# Patient Record
Sex: Female | Born: 1985 | Race: Black or African American | Hispanic: No | Marital: Single | Smoking: Never smoker
Health system: Southern US, Community
[De-identification: ages and names within clinical notes are randomized; demographics above are authoritative.]

## PROBLEM LIST (undated history)

## (undated) DIAGNOSIS — R569 Unspecified convulsions: Secondary | ICD-10-CM

## (undated) DIAGNOSIS — N289 Disorder of kidney and ureter, unspecified: Secondary | ICD-10-CM

## (undated) DIAGNOSIS — D649 Anemia, unspecified: Secondary | ICD-10-CM

## (undated) DIAGNOSIS — I2699 Other pulmonary embolism without acute cor pulmonale: Secondary | ICD-10-CM

## (undated) DIAGNOSIS — E739 Lactose intolerance, unspecified: Secondary | ICD-10-CM

## (undated) HISTORY — DX: Anemia, unspecified: D64.9

## (undated) HISTORY — DX: Unspecified convulsions: R56.9

## (undated) HISTORY — DX: Lactose intolerance, unspecified: E73.9

## (undated) HISTORY — PX: NO PAST SURGERIES: SHX2092

## (undated) HISTORY — DX: Disorder of kidney and ureter, unspecified: N28.9

---

## 2016-01-14 DIAGNOSIS — O88219 Thromboembolism in pregnancy, unspecified trimester: Secondary | ICD-10-CM | POA: Insufficient documentation

## 2016-02-17 DIAGNOSIS — Z86711 Personal history of pulmonary embolism: Secondary | ICD-10-CM | POA: Insufficient documentation

## 2017-09-10 ENCOUNTER — Emergency Department (HOSPITAL_COMMUNITY)
Admission: EM | Admit: 2017-09-10 | Discharge: 2017-09-10 | Discharge status: Against Medical Advice | Payer: Medicaid Other

## 2017-09-13 ENCOUNTER — Emergency Department (HOSPITAL_COMMUNITY): Payer: Medicaid Other

## 2017-09-13 ENCOUNTER — Encounter (HOSPITAL_COMMUNITY): Payer: Self-pay | Admitting: *Deleted

## 2017-09-13 ENCOUNTER — Emergency Department (HOSPITAL_COMMUNITY)
Admission: EM | Admit: 2017-09-13 | Discharge: 2017-09-13 | Disposition: A | Discharge status: Home / Self Care | Payer: Medicaid Other | Attending: Emergency Medicine | Admitting: Emergency Medicine

## 2017-09-13 DIAGNOSIS — M25561 Pain in right knee: Secondary | ICD-10-CM | POA: Insufficient documentation

## 2017-09-13 DIAGNOSIS — R197 Diarrhea, unspecified: Secondary | ICD-10-CM | POA: Diagnosis present

## 2017-09-13 DIAGNOSIS — M7918 Myalgia, other site: Secondary | ICD-10-CM | POA: Diagnosis not present

## 2017-09-13 DIAGNOSIS — R112 Nausea with vomiting, unspecified: Secondary | ICD-10-CM | POA: Insufficient documentation

## 2017-09-13 DIAGNOSIS — M791 Myalgia, unspecified site: Secondary | ICD-10-CM

## 2017-09-13 HISTORY — DX: Other pulmonary embolism without acute cor pulmonale: I26.99

## 2017-09-13 LAB — URINALYSIS, ROUTINE W REFLEX MICROSCOPIC
Bilirubin Urine: NEGATIVE
Glucose, UA: NEGATIVE mg/dL
Hgb urine dipstick: NEGATIVE
Ketones, ur: NEGATIVE mg/dL
NITRITE: NEGATIVE
PH: 6 (ref 5.0–8.0)
PROTEIN: NEGATIVE mg/dL
Specific Gravity, Urine: 1.011 (ref 1.005–1.030)

## 2017-09-13 LAB — COMPREHENSIVE METABOLIC PANEL
ALT: 15 U/L (ref 14–54)
ANION GAP: 9 (ref 5–15)
AST: 17 U/L (ref 15–41)
Albumin: 4.1 g/dL (ref 3.5–5.0)
Alkaline Phosphatase: 51 U/L (ref 38–126)
BUN: 6 mg/dL (ref 6–20)
CHLORIDE: 108 mmol/L (ref 101–111)
CO2: 21 mmol/L — ABNORMAL LOW (ref 22–32)
Calcium: 9 mg/dL (ref 8.9–10.3)
Creatinine, Ser: 0.75 mg/dL (ref 0.44–1.00)
Glucose, Bld: 91 mg/dL (ref 65–99)
POTASSIUM: 3.9 mmol/L (ref 3.5–5.1)
Sodium: 138 mmol/L (ref 135–145)
TOTAL PROTEIN: 8 g/dL (ref 6.5–8.1)
Total Bilirubin: 0.5 mg/dL (ref 0.3–1.2)

## 2017-09-13 LAB — LIPASE, BLOOD: LIPASE: 28 U/L (ref 11–51)

## 2017-09-13 LAB — CBC
HEMATOCRIT: 39.5 % (ref 36.0–46.0)
Hemoglobin: 12.9 g/dL (ref 12.0–15.0)
MCH: 27.9 pg (ref 26.0–34.0)
MCHC: 32.7 g/dL (ref 30.0–36.0)
MCV: 85.3 fL (ref 78.0–100.0)
Platelets: 214 10*3/uL (ref 150–400)
RBC: 4.63 MIL/uL (ref 3.87–5.11)
RDW: 15 % (ref 11.5–15.5)
WBC: 5.8 10*3/uL (ref 4.0–10.5)

## 2017-09-13 LAB — I-STAT BETA HCG BLOOD, ED (MC, WL, AP ONLY): I-stat hCG, quantitative: <5 m[IU]/mL (ref <5)

## 2017-09-13 MED ORDER — ONDANSETRON 4 MG PO TBDP: 4.0000 mg | ORAL_TABLET | Freq: Three times a day (TID) | ORAL | 0 refills | Status: AC | PRN

## 2017-09-13 NOTE — ED Notes (Signed)
Patient provided with water.

## 2017-09-13 NOTE — ED Provider Notes (Addendum)
[facility]-EMERGENCY DEPT Provider Note  CSN: [id number] Arrival date & time: 09/13/17 1040  Chief Complaint(s) Diarrhea; Generalized Body Aches; and Headache  HPI Gabriella Montgomery is a 32 y.o. female   The history is provided by the patient.  GI Problem  This is a new problem. Episode onset: 4 days. The problem occurs daily. Progression since onset: fluctuating. Associated symptoms include headaches. Pertinent negatives include no chest pain, no abdominal pain and no shortness of breath. Nothing aggravates the symptoms. Nothing relieves the symptoms. Treatments tried: pepto bismal. The treatment provided mild relief.  Headache   This is a recurrent problem. Episode onset: several days. Episode frequency: intermittent. Progression since onset: fluctuating. Associated with: N/V/D. The pain is located in the frontal, temporal and bilateral region. The quality of the pain is described as throbbing. The pain is moderate. The pain does not radiate. Associated symptoms include nausea and vomiting. Pertinent negatives include no fever and no shortness of breath. She has tried acetaminophen for the symptoms. The treatment provided mild relief.   Reports sick contacts at home with GI sx.  Patient also reports having a syncopal episode while at work.  She states that she felt lightheaded and was trying to get to a chair when she fell causing her to land on her right knee.  She reports that she works at [employer] where it is hot.  Denies any head trauma.  Right knee pain is constant and exacerbated with ambulation and palpation.  No alleviating factors.  Denies any other injuries.  Went to [facility] on Friday, but LWBS.  Past Medical History Past Medical History:  Diagnosis Date  . Pulmonary embolism (HCC)    There are no active problems to display for this patient.  Home Medication(s) Prior to Admission medications   Medication Sig Start Date End Date Taking? Authorizing  Provider  ondansetron (ZOFRAN ODT) 4 MG disintegrating tablet Take 1 tablet (4 mg total) by mouth every 8 (eight) hours as needed for up to 3 days for nausea or vomiting. 09/13/17 09/16/17  Cardama, Amadeo Garnet, MD                                                                                                                                    Past Surgical History History reviewed. No pertinent surgical history. Family History No family history on file.  Social History Social History   Tobacco Use  . Smoking status: Never Smoker  . Smokeless tobacco: Never Used  Substance Use Topics  . Alcohol use: Not Currently  . Drug use: Not on file   Allergies Amoxicillin; Ibuprofen; and Tramadol  Review of Systems Review of Systems  Constitutional: Negative for fever.  Respiratory: Negative for shortness of breath.   Cardiovascular: Negative for chest pain.  Gastrointestinal: Positive for nausea and vomiting. Negative for abdominal pain.  Neurological: Positive for headaches.   All other systems are reviewed  and are negative for acute change except as noted in the HPI  Physical Exam Vital Signs  I have reviewed the triage vital signs BP 139/86 (BP Location: Left Arm)   Pulse 87   Temp 98.5 F (36.9 C) (Oral)   Resp 18   LMP 08/14/2017   SpO2 100%   Physical Exam  Constitutional: She is oriented to person, place, and time. She appears well-developed and well-nourished. No distress.  HENT:  Head: Normocephalic and atraumatic.  Nose: Nose normal.  Eyes: Pupils are equal, round, and reactive to light. Conjunctivae and EOM are normal. Right eye exhibits no discharge. Left eye exhibits no discharge. No scleral icterus.  Neck: Normal range of motion. Neck supple.  Cardiovascular: Normal rate and regular rhythm. Exam reveals no gallop and no friction rub.  No murmur heard. Pulmonary/Chest: Effort normal and breath sounds normal. No stridor. No respiratory distress. She has no rales.    Abdominal: Soft. She exhibits no distension. There is no tenderness. There is no rigidity and no guarding.  Musculoskeletal: She exhibits no edema or tenderness.  Neurological: She is alert and oriented to person, place, and time.  Skin: Skin is warm and dry. No rash noted. She is not diaphoretic. No erythema.  Psychiatric: She has a normal mood and affect.  Vitals reviewed.   ED Results and Treatments Labs (all labs ordered are listed, but only abnormal results are displayed) Labs Reviewed  COMPREHENSIVE METABOLIC PANEL - Abnormal; Notable for the following components:      Result Value   CO2 21 (*)    All other components within normal limits  URINALYSIS, ROUTINE W REFLEX MICROSCOPIC - Abnormal; Notable for the following components:   Leukocytes, UA SMALL (*)    Bacteria, UA RARE (*)    All other components within normal limits  LIPASE, BLOOD  CBC  I-STAT BETA HCG BLOOD, ED ([facility], [facility], [facility] ONLY)                                                                                                                         EKG  EKG Interpretation  Date/Time:    Ventricular Rate:    PR Interval:    QRS Duration:   QT Interval:    QTC Calculation:   R Axis:     Text Interpretation:        Radiology Dg Knee Complete 4 Views Right  Result Date: 09/13/2017 CLINICAL DATA:  Knee pain after falling EXAM: RIGHT KNEE - COMPLETE 4+ VIEW COMPARISON:  None. FINDINGS: Well corticated fragments at the tibial tubercle may be a sequela of Osgood Schlatter disease. There is a small suprapatellar effusion. No acute fracture or dislocation of the right knee. IMPRESSION: 1. No acute fracture or dislocation of the right knee. 2. Corticated fragments at the tibial tubercle may be a sequela of Osgood-Schlatter disease. Electronically Signed   By: Deatra Robinson M.D.   On: 09/13/2017 17:54   Pertinent labs & imaging results  that were available during my care of the patient were reviewed by me and considered  in my medical decision making (see chart for details).  Medications Ordered in ED Medications - No data to display                                                                                                                                  Procedures Procedures  (including critical care time)  Medical Decision Making / ED Course I have reviewed the nursing notes for this encounter and the patient's prior records (if available in EHR or on provided paperwork).    32 y.o. female presents with vomiting, diarrhea, myalgia for 4 days. No possible suspicious food intake. decreased oral tolerance. Rest of history as above.  Patient appears well, not in distress, and with no signs of toxicity or dehydration. Abdomen benign.  Rest of the exam as above  Most consistent with viral gastroenteritis.   Doubt appendicitis, diverticulitis, severe colitis, dysentery.    Able to tolerate oral intake in the ED.  Plain film of the right knee without acute fracture dislocation.  Discussed symptomatic treatment with the patient and they will follow closely with their PCP.   Final Clinical Impression(s) / ED Diagnoses Final diagnoses:  Nausea vomiting and diarrhea  Myalgia  Right knee pain   Disposition: Discharge  Condition: Good  I have discussed the results, Dx and Tx plan with the patient who expressed understanding and agree(s) with the plan. Discharge instructions discussed at great length. The patient was given strict return precautions who verbalized understanding of the instructions. No further questions at time of discharge.    ED Discharge Orders        Ordered    ondansetron (ZOFRAN ODT) 4 MG disintegrating tablet  Every 8 hours PRN     09/13/17 1633       Follow Up: Primary care provider   If you do not have a primary care physician, contact [facility] at [phone] for referral      This chart was dictated using voice recognition software.  Despite best  efforts to proofread,  errors can occur which can change the documentation meaning.     Nira Conn, MD 09/13/17 [phone]

## 2017-09-13 NOTE — ED Notes (Signed)
Patient transported to X-ray 

## 2017-09-13 NOTE — ED Triage Notes (Signed)
Pt complains of diarrhea, body aches, dizziness for the past couple of days. Pt states she felt lightheaded and passed out a couple of days ago, scraping her right ankle.

## 2017-09-13 NOTE — ED Notes (Signed)
ED Provider at bedside. 

## 2017-09-13 NOTE — ED Notes (Signed)
No answer when called for recheck on vital signs 

## 2018-04-03 ENCOUNTER — Emergency Department (HOSPITAL_COMMUNITY): Payer: Medicaid Other

## 2018-04-03 ENCOUNTER — Emergency Department (HOSPITAL_COMMUNITY)
Admission: EM | Admit: 2018-04-03 | Discharge: 2018-04-03 | Disposition: A | Discharge status: Home / Self Care | Payer: Medicaid Other | Attending: Emergency Medicine | Admitting: Emergency Medicine

## 2018-04-03 ENCOUNTER — Encounter (HOSPITAL_COMMUNITY): Payer: Self-pay | Admitting: Emergency Medicine

## 2018-04-03 DIAGNOSIS — Y92512 Supermarket, store or market as the place of occurrence of the external cause: Secondary | ICD-10-CM | POA: Insufficient documentation

## 2018-04-03 DIAGNOSIS — Y9301 Activity, walking, marching and hiking: Secondary | ICD-10-CM | POA: Insufficient documentation

## 2018-04-03 DIAGNOSIS — Z86711 Personal history of pulmonary embolism: Secondary | ICD-10-CM | POA: Diagnosis not present

## 2018-04-03 DIAGNOSIS — R030 Elevated blood-pressure reading, without diagnosis of hypertension: Secondary | ICD-10-CM | POA: Diagnosis not present

## 2018-04-03 DIAGNOSIS — M25561 Pain in right knee: Secondary | ICD-10-CM

## 2018-04-03 DIAGNOSIS — S8991XA Unspecified injury of right lower leg, initial encounter: Secondary | ICD-10-CM | POA: Insufficient documentation

## 2018-04-03 DIAGNOSIS — Y998 Other external cause status: Secondary | ICD-10-CM | POA: Insufficient documentation

## 2018-04-03 DIAGNOSIS — S39012A Strain of muscle, fascia and tendon of lower back, initial encounter: Secondary | ICD-10-CM | POA: Insufficient documentation

## 2018-04-03 LAB — POC URINE PREG, ED: PREG TEST UR: NEGATIVE

## 2018-04-03 MED ORDER — ACETAMINOPHEN 325 MG PO TABS: 650.0000 mg | ORAL_TABLET | Freq: Four times a day (QID) | ORAL | 0 refills | Status: DC | PRN

## 2018-04-03 MED ORDER — METHOCARBAMOL 500 MG PO TABS: 500.0000 mg | ORAL_TABLET | Freq: Two times a day (BID) | ORAL | 0 refills | Status: DC
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
## 2018-04-03 NOTE — Discharge Instructions (Addendum)
It was my pleasure taking care of you today!   Please see the information and instructions below regarding your visit.  Your diagnoses today include:  1. Acute pain of right knee   2. Injury of right knee, initial encounter   3. Strain of lumbar region, initial encounter   4. Elevated blood pressure reading without diagnosis of hypertension     Tests performed today include: See side panel of your discharge paperwork for testing performed today. Vital signs are listed at the bottom of these instructions.   Medications prescribed:    Take any prescribed medications only as prescribed, and any over the counter medications only as directed on the packaging.  Naproxen and Robaxin as needed for pain. Use crutches as needed for comfort. Ice and elevate knee throughout the day.  You are prescribed Robaxin, a muscle relaxant. Some common side effects of this medication include:  Feeling sleepy.  Dizziness. Take care upon going from a seated to a standing position.  Dry mouth.  Feeling tired or weak.  Hard stools (constipation).  Upset stomach. These are not all of the side effects that may occur. If you have questions about side effects, call your doctor. Call your primary care provider for medical advice about side effects.  This medication can be sedating. Only take this medication as needed. Please do not combine with alcohol. Do not drive or operate machinery while taking this medication.   This medication can interact with some other medications. Make sure to tell any provider you are taking this medication before they prescribe you a new medication.   You are prescribed Tylenol.  You may take 650 mg every 6 hours as needed for pain.  Do not exceed 4 g in 1 day.  Home care instructions:  Please follow any educational materials contained in this packet.   Please apply the knee sleeve.  Please weight bear as tolerated on the right knee.   Follow-up instructions: Please follow-up  with your primary care provider in one week for further evaluation of your symptoms if they are not completely improved.   Call the orthopedist listed today or tomorrow to schedule follow up appointment for recheck of ongoing knee pain in one to two weeks. That appointment can be canceled with a 24-48 hour notice if complete resolution of pain.   Follow up with the orthopedist listed if symptoms do not improve in one week.   Return instructions:  Please return to the Emergency Department if you experience worsening symptoms.  Please return for any increasing swelling, increasing pain, loss of color to the lower leg, or increasing redness. Please return if you have any other emergent concerns.  Additional Information:   Your vital signs today were: BP 139/90 (BP Location: Left Arm)    Pulse 66    Temp 98.9 F (37.2 C) (Oral)    Resp 18    LMP 03/27/2018    SpO2 100%  If your blood pressure (BP) was elevated on multiple readings during this visit above 130 for the top number or above 80 for the bottom number, please have this repeated by your primary care provider within one month. --------------
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
## 2018-06-30 ENCOUNTER — Encounter (HOSPITAL_COMMUNITY): Payer: Self-pay | Admitting: Emergency Medicine

## 2018-06-30 ENCOUNTER — Emergency Department (HOSPITAL_COMMUNITY)
Admission: EM | Admit: 2018-06-30 | Discharge: 2018-06-30 | Disposition: A | Discharge status: Home / Self Care | Payer: Medicaid Other | Attending: Emergency Medicine | Admitting: Emergency Medicine

## 2018-06-30 ENCOUNTER — Other Ambulatory Visit: Payer: Self-pay

## 2018-06-30 DIAGNOSIS — Z79899 Other long term (current) drug therapy: Secondary | ICD-10-CM | POA: Insufficient documentation

## 2018-06-30 DIAGNOSIS — B9789 Other viral agents as the cause of diseases classified elsewhere: Secondary | ICD-10-CM | POA: Diagnosis not present

## 2018-06-30 DIAGNOSIS — J029 Acute pharyngitis, unspecified: Secondary | ICD-10-CM | POA: Diagnosis present

## 2018-06-30 DIAGNOSIS — J069 Acute upper respiratory infection, unspecified: Secondary | ICD-10-CM | POA: Insufficient documentation

## 2018-06-30 MED ORDER — PREDNISONE 20 MG PO TABS
60.0000 mg | ORAL_TABLET | Freq: Once | ORAL | Status: AC
  Administered 2018-06-30: 60 mg via ORAL
  Filled 2018-06-30: qty 3

## 2018-06-30 MED ORDER — ALBUTEROL SULFATE HFA 108 (90 BASE) MCG/ACT IN AERS
2.0000 | INHALATION_SPRAY | RESPIRATORY_TRACT | Status: DC | PRN
  Administered 2018-06-30: 2 via RESPIRATORY_TRACT
  Filled 2018-06-30: qty 6.7

## 2018-06-30 MED ORDER — PREDNISONE 20 MG PO TABS: 40.0000 mg | ORAL_TABLET | Freq: Every day | ORAL | 0 refills | Status: DC

## 2018-06-30 MED ORDER — BENZONATATE 100 MG PO CAPS: 100.0000 mg | ORAL_CAPSULE | Freq: Three times a day (TID) | ORAL | 0 refills | Status: DC | PRN

## 2018-06-30 NOTE — ED Triage Notes (Addendum)
Patient complaining of sore throat that started Sunday night. She also complaining of catching herself wheezing at night. She also states when she cough it makes her head hurt.

## 2018-06-30 NOTE — ED Notes (Signed)
Bed: WA05 Expected date:  Expected time:  Means of arrival:  Comments: 

## 2018-06-30 NOTE — ED Provider Notes (Signed)
[facility]-EMER[facility] DEPT Provider Note   CSN: [id number] Arrival date & time: 06/30/18  5883    History   Chief Complaint Chief Complaint  Patient presents with  . Sore Throat  . Wheezing    HPI Gabriella Montgomery is a 33 y.o. female.     Patient presents to the emergency department for evaluation of sore throat and cough.  Symptoms have been present for 4 or 5 days.  She reports that sometimes, mostly at night, she feels like she is wheezing.  No chest pain or shortness of breath.  She has not had a fever.     Past Medical History:  Diagnosis Date  . Pulmonary embolism (HCC)     There are no active problems to display for this patient.   History reviewed. No pertinent surgical history.   OB History   No obstetric history on file.      Home Medications    Prior to Admission medications   Medication Sig Start Date End Date Taking? Authorizing Provider  acetaminophen (TYLENOL) 325 MG tablet Take 2 tablets (650 mg total) by mouth every 6 (six) hours as needed. 04/03/18   Aviva Kluver B, PA-C  benzonatate (TESSALON) 100 MG capsule Take 1 capsule (100 mg total) by mouth 3 (three) times daily as needed for cough. 06/30/18   Gilda Crease, MD  methocarbamol (ROBAXIN) 500 MG tablet Take 1 tablet (500 mg total) by mouth 2 (two) times daily. 04/03/18   Aviva Kluver B, PA-C  predniSONE (DELTASONE) 20 MG tablet Take 2 tablets (40 mg total) by mouth daily with breakfast. 06/30/18   Aly Seidenberg, Canary Brim, MD    Family History History reviewed. No pertinent family history.  Social History Social History   Tobacco Use  . Smoking status: Never Smoker  . Smokeless tobacco: Never Used  Substance Use Topics  . Alcohol use: Not Currently  . Drug use: Never     Allergies   Azithromycin; Amoxicillin; Clindamycin; Ibuprofen; and Tramadol   Review of Systems Review of Systems  Constitutional: Negative for fever.  HENT: Positive for sore  throat.   Respiratory: Positive for cough.   All other systems reviewed and are negative.    Physical Exam Updated Vital Signs BP 129/90 (BP Location: Left Arm)   Pulse 86   Temp 98.5 F (36.9 C) (Oral)   Resp 20   Ht 5\' 5"  (1.651 m)   Wt 99.8 kg   LMP 06/04/2018   SpO2 100%   BMI 36.61 kg/m   Physical Exam Vitals signs and nursing note reviewed.  Constitutional:      General: She is not in acute distress.    Appearance: Normal appearance. She is well-developed.  HENT:     Head: Normocephalic and atraumatic.     Right Ear: Hearing normal.     Left Ear: Hearing normal.     Nose: Nose normal.  Eyes:     Conjunctiva/sclera: Conjunctivae normal.     Pupils: Pupils are equal, round, and reactive to light.  Neck:     Musculoskeletal: Normal range of motion and neck supple.  Cardiovascular:     Rate and Rhythm: Regular rhythm.     Heart sounds: S1 normal and S2 normal. No murmur. No friction rub. No gallop.   Pulmonary:     Effort: Pulmonary effort is normal. No respiratory distress.     Breath sounds: Normal breath sounds.  Chest:     Chest wall: No tenderness.  Abdominal:     General: Bowel sounds are normal.     Palpations: Abdomen is soft.     Tenderness: There is no abdominal tenderness. There is no guarding or rebound. Negative signs include Murphy's sign and McBurney's sign.     Hernia: No hernia is present.  Musculoskeletal: Normal range of motion.  Skin:    General: Skin is warm and dry.     Findings: No rash.  Neurological:     Mental Status: She is alert and oriented to person, place, and time.     GCS: GCS eye subscore is 4. GCS verbal subscore is 5. GCS motor subscore is 6.     Cranial Nerves: No cranial nerve deficit.     Sensory: No sensory deficit.     Coordination: Coordination normal.  Psychiatric:        Speech: Speech normal.        Behavior: Behavior normal.        Thought Content: Thought content normal.      ED Treatments / Results    Labs (all labs ordered are listed, but only abnormal results are displayed) Labs Reviewed - No data to display  EKG None  Radiology No results found.  Procedures Procedures (including critical care time)  Medications Ordered in ED Medications  albuterol (PROVENTIL HFA;VENTOLIN HFA) 108 (90 Base) MCG/ACT inhaler 2 puff (has no administration in time range)  predniSONE (DELTASONE) tablet 60 mg (has no administration in time range)     Initial Impression / Assessment and Plan / ED Course  I have reviewed the triage vital signs and the nursing notes.  Pertinent labs & imaging results that were available during my care of the patient were reviewed by me and considered in my medical decision making (see chart for details).        Patient presents with complaints of sore throat and cough.  Her lungs are clear on auscultation.  No wheezing, no clinical concern for pneumonia.  Oxygen saturation 100% on room air.  All vitals are normal, afebrile.  Oral pharyngeal examination is clear, no erythema, exudate or signs of abscess.  Patient reassured, treat for viral upper respiratory infection with possible intermittent bronchospasm.  Final Clinical Impressions(s) / ED Diagnoses   Final diagnoses:  Viral URI with cough    ED Discharge Orders         Ordered    benzonatate (TESSALON) 100 MG capsule  3 times daily PRN     06/30/18 0546    predniSONE (DELTASONE) 20 MG tablet  Daily with breakfast     06/30/18 0546           Gilda Crease, MD 06/30/18 [phone]

## 2019-07-13 ENCOUNTER — Emergency Department (HOSPITAL_COMMUNITY): Payer: Medicaid Other

## 2019-07-13 ENCOUNTER — Other Ambulatory Visit: Payer: Self-pay

## 2019-07-13 ENCOUNTER — Inpatient Hospital Stay (HOSPITAL_COMMUNITY)
Admission: EM | Admit: 2019-07-13 | Discharge: 2019-07-19 | DRG: 177 | Disposition: A | Discharge status: Home / Self Care | Payer: Medicaid Other | Attending: Internal Medicine | Admitting: Internal Medicine

## 2019-07-13 DIAGNOSIS — E876 Hypokalemia: Secondary | ICD-10-CM | POA: Diagnosis not present

## 2019-07-13 DIAGNOSIS — U071 COVID-19: Secondary | ICD-10-CM | POA: Diagnosis not present

## 2019-07-13 DIAGNOSIS — J1282 Pneumonia due to coronavirus disease 2019: Secondary | ICD-10-CM

## 2019-07-13 DIAGNOSIS — R509 Fever, unspecified: Secondary | ICD-10-CM | POA: Diagnosis not present

## 2019-07-13 DIAGNOSIS — J9601 Acute respiratory failure with hypoxia: Secondary | ICD-10-CM | POA: Diagnosis present

## 2019-07-13 DIAGNOSIS — E860 Dehydration: Secondary | ICD-10-CM | POA: Diagnosis present

## 2019-07-13 DIAGNOSIS — Z23 Encounter for immunization: Secondary | ICD-10-CM

## 2019-07-13 DIAGNOSIS — S0990XA Unspecified injury of head, initial encounter: Secondary | ICD-10-CM | POA: Diagnosis not present

## 2019-07-13 DIAGNOSIS — R4182 Altered mental status, unspecified: Secondary | ICD-10-CM | POA: Diagnosis not present

## 2019-07-13 DIAGNOSIS — N170 Acute kidney failure with tubular necrosis: Secondary | ICD-10-CM | POA: Diagnosis present

## 2019-07-13 DIAGNOSIS — Z885 Allergy status to narcotic agent status: Secondary | ICD-10-CM

## 2019-07-13 DIAGNOSIS — J189 Pneumonia, unspecified organism: Secondary | ICD-10-CM | POA: Diagnosis present

## 2019-07-13 DIAGNOSIS — S199XXA Unspecified injury of neck, initial encounter: Secondary | ICD-10-CM | POA: Diagnosis not present

## 2019-07-13 DIAGNOSIS — G40909 Epilepsy, unspecified, not intractable, without status epilepticus: Secondary | ICD-10-CM | POA: Diagnosis present

## 2019-07-13 DIAGNOSIS — R404 Transient alteration of awareness: Secondary | ICD-10-CM | POA: Diagnosis not present

## 2019-07-13 DIAGNOSIS — Z886 Allergy status to analgesic agent status: Secondary | ICD-10-CM

## 2019-07-13 DIAGNOSIS — R55 Syncope and collapse: Secondary | ICD-10-CM

## 2019-07-13 DIAGNOSIS — I959 Hypotension, unspecified: Secondary | ICD-10-CM | POA: Diagnosis not present

## 2019-07-13 DIAGNOSIS — I2721 Secondary pulmonary arterial hypertension: Secondary | ICD-10-CM | POA: Diagnosis present

## 2019-07-13 DIAGNOSIS — R079 Chest pain, unspecified: Secondary | ICD-10-CM | POA: Diagnosis not present

## 2019-07-13 DIAGNOSIS — R569 Unspecified convulsions: Secondary | ICD-10-CM

## 2019-07-13 DIAGNOSIS — E872 Acidosis: Secondary | ICD-10-CM | POA: Diagnosis present

## 2019-07-13 DIAGNOSIS — K760 Fatty (change of) liver, not elsewhere classified: Secondary | ICD-10-CM | POA: Diagnosis not present

## 2019-07-13 DIAGNOSIS — J1289 Other viral pneumonia: Secondary | ICD-10-CM | POA: Diagnosis not present

## 2019-07-13 DIAGNOSIS — N179 Acute kidney failure, unspecified: Secondary | ICD-10-CM

## 2019-07-13 DIAGNOSIS — R000 Tachycardia, unspecified: Secondary | ICD-10-CM | POA: Diagnosis not present

## 2019-07-13 DIAGNOSIS — R4020 Unspecified coma: Secondary | ICD-10-CM | POA: Diagnosis not present

## 2019-07-13 DIAGNOSIS — Z86711 Personal history of pulmonary embolism: Secondary | ICD-10-CM

## 2019-07-13 DIAGNOSIS — Z880 Allergy status to penicillin: Secondary | ICD-10-CM

## 2019-07-13 DIAGNOSIS — Z881 Allergy status to other antibiotic agents status: Secondary | ICD-10-CM

## 2019-07-13 LAB — PROTIME-INR
INR: 1 (ref 0.8–1.2)
Prothrombin Time: 13.4 seconds (ref 11.4–15.2)

## 2019-07-13 LAB — URINALYSIS, ROUTINE W REFLEX MICROSCOPIC
Bilirubin Urine: NEGATIVE
Glucose, UA: NEGATIVE mg/dL
Hgb urine dipstick: NEGATIVE
Ketones, ur: NEGATIVE mg/dL
Leukocytes,Ua: NEGATIVE
Nitrite: NEGATIVE
Protein, ur: >=300 mg/dL — AB
Specific Gravity, Urine: 1.03 (ref 1.005–1.030)
pH: 5 (ref 5.0–8.0)

## 2019-07-13 LAB — CBC WITH DIFFERENTIAL/PLATELET
Abs Immature Granulocytes: 0 10*3/uL (ref 0.00–0.07)
Basophils Absolute: 0 10*3/uL (ref 0.0–0.1)
Basophils Relative: 0 %
Eosinophils Absolute: 0 10*3/uL (ref 0.0–0.5)
Eosinophils Relative: 0 %
HCT: 47.3 % — ABNORMAL HIGH (ref 36.0–46.0)
Hemoglobin: 15.1 g/dL — ABNORMAL HIGH (ref 12.0–15.0)
Lymphocytes Relative: 39 %
Lymphs Abs: 2.6 10*3/uL (ref 0.7–4.0)
MCH: 27.1 pg (ref 26.0–34.0)
MCHC: 31.9 g/dL (ref 30.0–36.0)
MCV: 84.8 fL (ref 80.0–100.0)
Monocytes Absolute: 0.1 10*3/uL (ref 0.1–1.0)
Monocytes Relative: 2 %
Neutro Abs: 3.9 10*3/uL (ref 1.7–7.7)
Neutrophils Relative %: 59 %
Platelets: 207 10*3/uL (ref 150–400)
RBC: 5.58 MIL/uL — ABNORMAL HIGH (ref 3.87–5.11)
RDW: 14.5 % (ref 11.5–15.5)
WBC: 6.6 10*3/uL (ref 4.0–10.5)
nRBC: 0 % (ref 0.0–0.2)
nRBC: 0 /100 WBC

## 2019-07-13 LAB — I-STAT BETA HCG BLOOD, ED (MC, WL, AP ONLY): I-stat hCG, quantitative: <5 m[IU]/mL (ref <5)

## 2019-07-13 LAB — COMPREHENSIVE METABOLIC PANEL
ALT: 27 U/L (ref 0–44)
AST: 35 U/L (ref 15–41)
Albumin: 3.8 g/dL (ref 3.5–5.0)
Alkaline Phosphatase: 52 U/L (ref 38–126)
Anion gap: 14 (ref 5–15)
BUN: 19 mg/dL (ref 6–20)
CO2: 21 mmol/L — ABNORMAL LOW (ref 22–32)
Calcium: 9 mg/dL (ref 8.9–10.3)
Chloride: 104 mmol/L (ref 98–111)
Creatinine, Ser: 1.8 mg/dL — ABNORMAL HIGH (ref 0.44–1.00)
GFR calc Af Amer: 43 mL/min — ABNORMAL LOW (ref >60)
GFR calc non Af Amer: 37 mL/min — ABNORMAL LOW (ref >60)
Glucose, Bld: 123 mg/dL — ABNORMAL HIGH (ref 70–99)
Potassium: 4.1 mmol/L (ref 3.5–5.1)
Sodium: 139 mmol/L (ref 135–145)
Total Bilirubin: 0.6 mg/dL (ref 0.3–1.2)
Total Protein: 8.5 g/dL — ABNORMAL HIGH (ref 6.5–8.1)

## 2019-07-13 LAB — POC SARS CORONAVIRUS 2 AG -  ED: SARS Coronavirus 2 Ag: NEGATIVE

## 2019-07-13 LAB — APTT: aPTT: 30 seconds (ref 24–36)

## 2019-07-13 LAB — I-STAT CHEM 8, ED
BUN: 22 mg/dL — ABNORMAL HIGH (ref 6–20)
Calcium, Ion: 1.01 mmol/L — ABNORMAL LOW (ref 1.15–1.40)
Chloride: 107 mmol/L (ref 98–111)
Creatinine, Ser: 1.4 mg/dL — ABNORMAL HIGH (ref 0.44–1.00)
Glucose, Bld: 97 mg/dL (ref 70–99)
HCT: 47 % — ABNORMAL HIGH (ref 36.0–46.0)
Hemoglobin: 16 g/dL — ABNORMAL HIGH (ref 12.0–15.0)
Potassium: 4 mmol/L (ref 3.5–5.1)
Sodium: 138 mmol/L (ref 135–145)
TCO2: 28 mmol/L (ref 22–32)

## 2019-07-13 LAB — RAPID URINE DRUG SCREEN, HOSP PERFORMED
Amphetamines: NOT DETECTED
Barbiturates: NOT DETECTED
Benzodiazepines: POSITIVE — AB
Cocaine: NOT DETECTED
Opiates: NOT DETECTED
Tetrahydrocannabinol: NOT DETECTED

## 2019-07-13 LAB — ETHANOL: Alcohol, Ethyl (B): <10 mg/dL (ref <10)

## 2019-07-13 LAB — LACTIC ACID, PLASMA
Lactic Acid, Venous: 3.2 mmol/L (ref 0.5–1.9)
Lactic Acid, Venous: 0.8 mmol/L (ref 0.5–1.9)

## 2019-07-13 LAB — CBG MONITORING, ED: Glucose-Capillary: 98 mg/dL (ref 70–99)

## 2019-07-13 MED ORDER — ACETAMINOPHEN 650 MG RE SUPP
650.0000 mg | Freq: Once | RECTAL | Status: AC
  Administered 2019-07-13: 20:00:00 650 mg via RECTAL
  Filled 2019-07-13: qty 1

## 2019-07-13 MED ORDER — LIDOCAINE HCL 2 % IJ SOLN
10.0000 mL | Freq: Once | INTRAMUSCULAR | Status: AC
  Administered 2019-07-14: 200 mg
  Filled 2019-07-13: qty 20

## 2019-07-13 MED ORDER — SODIUM CHLORIDE 0.9 % IV BOLUS
1000.0000 mL | Freq: Once | INTRAVENOUS | Status: AC
  Administered 2019-07-13: 1000 mL via INTRAVENOUS

## 2019-07-13 MED ORDER — IOHEXOL 350 MG/ML SOLN
80.0000 mL | Freq: Once | INTRAVENOUS | Status: AC | PRN
  Administered 2019-07-13: 80 mL via INTRAVENOUS

## 2019-07-13 MED ORDER — LORAZEPAM 2 MG/ML IJ SOLN
1.0000 mg | Freq: Once | INTRAMUSCULAR | Status: AC
  Administered 2019-07-13: 1 mg via INTRAVENOUS
  Filled 2019-07-13: qty 1

## 2019-07-13 MED ORDER — ONDANSETRON HCL 4 MG/2ML IJ SOLN
4.0000 mg | Freq: Once | INTRAMUSCULAR | Status: AC
  Administered 2019-07-13: 4 mg via INTRAVENOUS
  Filled 2019-07-13: qty 2

## 2019-07-13 MED ORDER — SODIUM CHLORIDE 0.9 % IV BOLUS
1000.0000 mL | Freq: Once | INTRAVENOUS | Status: AC
  Administered 2019-07-13: 22:00:00 1000 mL via INTRAVENOUS

## 2019-07-13 NOTE — ED Provider Notes (Signed)
[facility] EMERGENCY DEPARTMENT Provider Note   CSN: [id number] Arrival date & time: 07/13/19  1828     History No chief complaint on file.   Gabriella Montgomery is a 34 y.o. female.  The history is provided by the patient, the EMS personnel and medical records. No language interpreter was used.   Gabriella Montgomery is a 34 y.o. female who presents to the Emergency Department complaining of seizure like activity. Level V caveat due to confusion. History is provided by EMS. EMS was called out after patient fell to the floor and then had generalized shaking activity consistent with seizure. On their arrival she had generalized activity and she was treated with Versed 5 mg IM. They reports that her blood pressure was 70 palpated. About two minutes after Versed administration she became more awake. Family reports that she had been ill for the last three days with flulike symptoms. On ED arrival patient reports that she is having pain, but cannot provide additional details.   Additional history available after patient's initial ED arrival from her mother. Mother reports that she has been sick for the last three days with fever, body aches, cough, congestion, vomiting. She was at home when she fell to the ground and had shaking all over but mostly her right upper extremity. No prior similar episodes. Her mother was recently ill with a respiratory illness but did recover. She was not tested for COVID-19.    Past Medical History:  Diagnosis Date  . Pulmonary embolism (HCC)     There are no problems to display for this patient.   No past surgical history on file.   OB History   No obstetric history on file.     No family history on file.  Social History   Tobacco Use  . Smoking status: Never Smoker  . Smokeless tobacco: Never Used  Substance Use Topics  . Alcohol use: Not Currently  . Drug use: Never    Home Medications Prior to Admission medications     Medication Sig Start Date End Date Taking? Authorizing Provider  acetaminophen (TYLENOL) 325 MG tablet Take 2 tablets (650 mg total) by mouth every 6 (six) hours as needed. Patient taking differently: Take 650 mg by mouth every 6 (six) hours as needed for mild pain.  04/03/18  Yes Murray, Alyssa B, PA-C  Phenylephrine-Pheniramine-DM (THERAFLU COLD & COUGH PO) Take 5 mLs by mouth daily as needed (For cold and flu symptoms).   Yes [provider]  benzonatate (TESSALON) 100 MG capsule Take 1 capsule (100 mg total) by mouth 3 (three) times daily as needed for cough. Patient not taking: Reported on 07/13/2019 06/30/18   Orpah Greek, MD  methocarbamol (ROBAXIN) 500 MG tablet Take 1 tablet (500 mg total) by mouth 2 (two) times daily. Patient not taking: Reported on 07/13/2019 04/03/18   Langston Masker B, PA-C  predniSONE (DELTASONE) 20 MG tablet Take 2 tablets (40 mg total) by mouth daily with breakfast. Patient not taking: Reported on 07/13/2019 06/30/18   Orpah Greek, MD    Allergies    Azithromycin, Amoxicillin, Clindamycin, Ibuprofen, and Tramadol  Review of Systems   Review of Systems  All other systems reviewed and are negative.   Physical Exam Updated Vital Signs BP 100/65 (BP Location: Left Wrist)   Pulse (!) 129   Temp (!) 102.8 F (39.3 C) (Oral)   Resp (!) 22   Ht 5\' 5"  (1.651 m)   Wt 99.8  kg   SpO2 98%   BMI 36.61 kg/m   Physical Exam Vitals and nursing note reviewed.  Constitutional:      Appearance: She is well-developed.  HENT:     Head: Normocephalic and atraumatic.  Cardiovascular:     Rate and Rhythm: Regular rhythm. Tachycardia present.     Heart sounds: No murmur.  Pulmonary:     Effort: Pulmonary effort is normal. No respiratory distress.     Breath sounds: Normal breath sounds.  Abdominal:     Palpations: Abdomen is soft.     Tenderness: There is no abdominal tenderness. There is no guarding or rebound.  Musculoskeletal:         General: No swelling or tenderness.  Skin:    General: Skin is warm and dry.  Neurological:     Mental Status: She is alert.     Comments: Confused. Slow to answer questions. Nods yes and no. Weak grip strength bilaterally. There is a tremor to the right upper extremity that stops on intention. Wiggles toes bilaterally. Unable to lift legs off the stretcher.  Psychiatric:        Behavior: Behavior normal.     ED Results / Procedures / Treatments   Labs (all labs ordered are listed, but only abnormal results are displayed) Labs Reviewed  LACTIC ACID, PLASMA - Abnormal; Notable for the following components:      Result Value   Lactic Acid, Venous 3.2 (*)    All other components within normal limits  COMPREHENSIVE METABOLIC PANEL - Abnormal; Notable for the following components:   CO2 21 (*)    Glucose, Bld 123 (*)    Creatinine, Ser 1.80 (*)    Total Protein 8.5 (*)    GFR calc non Af Amer 37 (*)    GFR calc Af Amer 43 (*)    All other components within normal limits  CBC WITH DIFFERENTIAL/PLATELET - Abnormal; Notable for the following components:   RBC 5.58 (*)    Hemoglobin 15.1 (*)    HCT 47.3 (*)    All other components within normal limits  URINALYSIS, ROUTINE W REFLEX MICROSCOPIC - Abnormal; Notable for the following components:   Color, Urine AMBER (*)    APPearance HAZY (*)    Protein, ur >=300 (*)    Bacteria, UA RARE (*)    All other components within normal limits  RAPID URINE DRUG SCREEN, HOSP PERFORMED - Abnormal; Notable for the following components:   Benzodiazepines POSITIVE (*)    All other components within normal limits  I-STAT CHEM 8, ED - Abnormal; Notable for the following components:   BUN 22 (*)    Creatinine, Ser 1.40 (*)    Calcium, Ion 1.01 (*)    Hemoglobin 16.0 (*)    HCT 47.0 (*)    All other components within normal limits  CULTURE, BLOOD (ROUTINE X 2)  CULTURE, BLOOD (ROUTINE X 2)  URINE CULTURE  SARS CORONAVIRUS 2 (TAT 6-24 HRS)  CSF  CULTURE  GRAM STAIN  LACTIC ACID, PLASMA  APTT  PROTIME-INR  ETHANOL  CSF CELL COUNT WITH DIFFERENTIAL  CSF CELL COUNT WITH DIFFERENTIAL  GLUCOSE, CSF  PROTEIN, CSF  I-STAT BETA HCG BLOOD, ED ([facility], [facility], [facility] ONLY)  I-STAT CHEM 8, ED  I-STAT BETA HCG BLOOD, ED ([facility], [facility], [facility] ONLY)  CBG MONITORING, ED  POC SARS CORONAVIRUS 2 AG -  ED    EKG EKG Interpretation  Date/Time:  Thursday July 13 2019 18:45:50 EDT  Ventricular Rate:  140 PR Interval:    QRS Duration: 79 QT Interval:  284 QTC Calculation: 434 R Axis:   97 Text Interpretation: Sinus tachycardia Borderline right axis deviation Borderline T abnormalities, diffuse leads no prior available for comparison Confirmed by Tilden Fossa [phone]) on 07/13/2019 7:08:14 PM   Radiology CT Head Wo Contrast  Result Date: 07/13/2019 CLINICAL DATA:  Seizure, fall EXAM: CT HEAD WITHOUT CONTRAST CT CERVICAL SPINE WITHOUT CONTRAST TECHNIQUE: Multidetector CT imaging of the head and cervical spine was performed following the standard protocol without intravenous contrast. Multiplanar CT image reconstructions of the cervical spine were also generated. COMPARISON:  None. FINDINGS: CT HEAD FINDINGS Brain: No acute territorial infarction, hemorrhage or intracranial mass. The ventricles are nonenlarged. Vascular: No hyperdense vessel or unexpected calcification. Skull: Normal. Negative for fracture or focal lesion. Sinuses/Orbits: Mucosal thickening in the sinuses. Other: None CT CERVICAL SPINE FINDINGS Alignment: Reversal of cervical lordosis. No subluxation. Facet alignment is normal Skull base and vertebrae: No acute fracture. No primary bone lesion or focal pathologic process. Soft tissues and spinal canal: No prevertebral fluid or swelling. No visible canal hematoma. Disc levels:  Within normal limits Upper chest: Negative. Other: None IMPRESSION: 1. Negative non contrasted CT appearance of the brain 2. Reversal of cervical lordosis.  No acute osseous  abnormality. Electronically Signed   By: Jasmine Pang M.D.   On: 07/13/2019 21:54   CT Angio Chest PE W/Cm &/Or Wo Cm  Result Date: 07/14/2019 CLINICAL DATA:  Left-sided chest and abdominal pain. History of pulmonary embolism. EXAM: CT ANGIOGRAPHY CHEST CT ABDOMEN AND PELVIS WITH CONTRAST TECHNIQUE: Multidetector CT imaging of the chest was performed using the standard protocol during bolus administration of intravenous contrast. Multiplanar CT image reconstructions and MIPs were obtained to evaluate the vascular anatomy. Multidetector CT imaging of the abdomen and pelvis was performed using the standard protocol during bolus administration of intravenous contrast. CONTRAST:  80mL OMNIPAQUE IOHEXOL 350 MG/ML SOLN COMPARISON:  None. FINDINGS: CTA CHEST FINDINGS Cardiovascular: Pulmonary artery hypertension, the pulmonary trunk measuring 33 mm. No central pulmonary embolism. Patient respiratory motion precludes evaluation of the more peripheral bilateral pulmonary arteries. No thoracic aorta calcified atherosclerosis or aneurysm. Mediastinum/Nodes: Bulky right hilar and subcarinal lymph nodes which are noncalcified and measure up to 18 mm diameter. Normal thyroid and decompressed esophagus. Lungs/Pleura: Patchy alveolar and airspace disease throughout the right lung and most confluent in the right middle lobe with air bronchograms. A couple small patchy ground-glass opacities in the left base. No pleural effusion or pneumothorax. A 4 mm pleural based sub solid nodule along the left major fissure, possibly a reactive subpleural lymph node. Musculoskeletal: Normal. Review of the MIP images confirms the above findings. CT ABDOMEN and PELVIS FINDINGS Hepatobiliary: Fatty infiltration of the hepatic parenchyma. Normal gallbladder and biliary tree. Pancreas: Normal. Spleen: Normal. Adrenals/Urinary Tract: Normal. Stomach/Bowel: Normal appendix, axial series 5, image 63. No mucosal thickening or obstruction.  Vascular/Lymphatic: A number of small noncalcified lymph nodes at the mesenteric root, likely reactive, measuring up to 17 by 15 mm. No abdominal aortic aneurysm, dissection or calcified atherosclerosis. Patent portal and hepatic veins. 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 Reproductive: No apparent abnormality. Other: No retroperitoneal hematoma, free intraperitoneal fluid or air. Musculoskeletal: Normal. The Review of the MIP images confirms the above findings. IMPRESSION: No central pulmonary embolism. Respiratory motion artifact precludes exclusion of a peripheral pulmonary embolism. Patchy alveolar and airspace disease in the  right lung, most confluent in the right middle lobe, likely pneumonia. A couple small focal alveolar opacities in the left base, also probably infectious. Unenhanced CT chest recommended in 3 months to document resolution. Bulky right hilar and subcarinal lymph nodes, likely reactive. Pulmonary artery hypertension. Hepatic steatosis. A number of small noncalcified lymph nodes at the mesenteric root, likely a reactive adenitis. Electronically Signed   By: Laurence Ferrari   On: 07/14/2019 00:14   CT Cervical Spine Wo Contrast  Result Date: 07/13/2019 CLINICAL DATA:  Seizure, fall EXAM: CT HEAD WITHOUT CONTRAST CT CERVICAL SPINE WITHOUT CONTRAST TECHNIQUE: Multidetector CT imaging of the head and cervical spine was performed following the standard protocol without intravenous contrast. Multiplanar CT image reconstructions of the cervical spine were also generated. COMPARISON:  None. FINDINGS: CT HEAD FINDINGS Brain: No acute territorial infarction, hemorrhage or intracranial mass. The ventricles are nonenlarged. Vascular: No hyperdense vessel or unexpected calcification. Skull: Normal. Negative for fracture or focal lesion. Sinuses/Orbits: Mucosal thickening in the sinuses. Other: None CT CERVICAL SPINE FINDINGS Alignment: Reversal of  cervical lordosis. No subluxation. Facet alignment is normal Skull base and vertebrae: No acute fracture. No primary bone lesion or focal pathologic process. Soft tissues and spinal canal: No prevertebral fluid or swelling. No visible canal hematoma. Disc levels:  Within normal limits Upper chest: Negative. Other: None IMPRESSION: 1. Negative non contrasted CT appearance of the brain 2. Reversal of cervical lordosis.  No acute osseous abnormality. Electronically Signed   By: Jasmine Pang M.D.   On: 07/13/2019 21:54   CT Abdomen Pelvis W Contrast  Result Date: 07/14/2019 CLINICAL DATA:  Left-sided chest and abdominal pain. History of pulmonary embolism. EXAM: CT ANGIOGRAPHY CHEST CT ABDOMEN AND PELVIS WITH CONTRAST TECHNIQUE: Multidetector CT imaging of the chest was performed using the standard protocol during bolus administration of intravenous contrast. Multiplanar CT image reconstructions and MIPs were obtained to evaluate the vascular anatomy. Multidetector CT imaging of the abdomen and pelvis was performed using the standard protocol during bolus administration of intravenous contrast. CONTRAST:  88mL OMNIPAQUE IOHEXOL 350 MG/ML SOLN COMPARISON:  None. FINDINGS: CTA CHEST FINDINGS Cardiovascular: Pulmonary artery hypertension, the pulmonary trunk measuring 33 mm. No central pulmonary embolism. Patient respiratory motion precludes evaluation of the more peripheral bilateral pulmonary arteries. No thoracic aorta calcified atherosclerosis or aneurysm. Mediastinum/Nodes: Bulky right hilar and subcarinal lymph nodes which are noncalcified and measure up to 18 mm diameter. Normal thyroid and decompressed esophagus. Lungs/Pleura: Patchy alveolar and airspace disease throughout the right lung and most confluent in the right middle lobe with air bronchograms. A couple small patchy ground-glass opacities in the left base. No pleural effusion or pneumothorax. A 4 mm pleural based sub solid nodule along the left major  fissure, possibly a reactive subpleural lymph node. Musculoskeletal: Normal. Review of the MIP images confirms the above findings. CT ABDOMEN and PELVIS FINDINGS Hepatobiliary: Fatty infiltration of the hepatic parenchyma. Normal gallbladder and biliary tree. Pancreas: Normal. Spleen: Normal. Adrenals/Urinary Tract: Normal. Stomach/Bowel: Normal appendix, axial series 5, image 63. No mucosal thickening or obstruction. Vascular/Lymphatic: A number of small noncalcified lymph nodes at the mesenteric root, likely reactive, measuring up to 17 by 15 mm. No abdominal aortic aneurysm, dissection or calcified atherosclerosis. Patent portal and hepatic veins. 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 Reproductive: No apparent abnormality. Other: No retroperitoneal hematoma, free intraperitoneal fluid or air. Musculoskeletal: Normal. The Review of the MIP images confirms the above findings.  IMPRESSION: No central pulmonary embolism. Respiratory motion artifact precludes exclusion of a peripheral pulmonary embolism. Patchy alveolar and airspace disease in the right lung, most confluent in the right middle lobe, likely pneumonia. A couple small focal alveolar opacities in the left base, also probably infectious. Unenhanced CT chest recommended in 3 months to document resolution. Bulky right hilar and subcarinal lymph nodes, likely reactive. Pulmonary artery hypertension. Hepatic steatosis. A number of small noncalcified lymph nodes at the mesenteric root, likely a reactive adenitis. Electronically Signed   By: Laurence Ferrari   On: 07/14/2019 00:14   DG Chest Port 1 View  Result Date: 07/13/2019 CLINICAL DATA:  Fever.  Altered mental status. EXAM: PORTABLE CHEST 1 VIEW COMPARISON:  None. FINDINGS: Cardiac silhouette normal in size. No mediastinal or hilar masses or evidence of adenopathy. Mild linear/interstitial type opacities right lower lung, which may reflect atelectasis.  Lungs otherwise clear. No pleural effusion or pneumothorax. Skeletal structures are intact. IMPRESSION: 1. Mild area linear/interstitial opacity at the right lung base, most likely atelectasis and crowding due to low lung volumes and patient rotation. A small focus pneumonia is possible. No other abnormality. Electronically Signed   By: Amie Portland M.D.   On: 07/13/2019 19:34    Procedures .Lumbar Puncture  Date/Time: 07/14/2019 1:06 AM Performed by: Tilden Fossa, MD Authorized by: Tilden Fossa, MD   Consent:    Consent obtained:  Verbal   Consent given by:  Patient and parent   Risks discussed:  Bleeding, infection, nerve damage, pain, headache and repeat procedure Pre-procedure details:    Procedure purpose:  Diagnostic Anesthesia (see MAR for exact dosages):    Anesthesia method:  Local infiltration   Local anesthetic:  Lidocaine 2% w/o epi Procedure details:    Lumbar space:  L4-L5 interspace   Patient position:  Sitting   Needle gauge:  20   Needle length (in):  3.5   Ultrasound guidance: no     Number of attempts:  2   Fluid appearance:  Blood-tinged then clearing   Tubes of fluid:  4   Total volume (ml):  4 Post-procedure:    Puncture site:  Adhesive bandage applied   Patient tolerance of procedure:  Tolerated well, no immediate complications   (including critical care time) CRITICAL CARE Performed by: Tilden Fossa   Total critical care time: 40 minutes  Critical care time was exclusive of separately billable procedures and treating other patients.  Critical care was necessary to treat or prevent imminent or life-threatening deterioration.  Critical care was time spent personally by me on the following activities: development of treatment plan with patient and/or surrogate as well as nursing, discussions with consultants, evaluation of patient's response to treatment, examination of patient, obtaining history from patient or surrogate, ordering and  performing treatments and interventions, ordering and review of laboratory studies, ordering and review of radiographic studies, pulse oximetry and re-evaluation of patient's condition.  Medications Ordered in ED Medications  cefTRIAXone (ROCEPHIN) 1 g in sodium chloride 0.9 % 100 mL IVPB (has no administration in time range)  doxycycline (VIBRA-TABS) tablet 100 mg (has no administration in time range)  sodium chloride 0.9 % bolus 1,000 mL (0 mLs Intravenous Stopped 07/13/19 2134)  LORazepam (ATIVAN) injection 1 mg (1 mg Intravenous Given 07/13/19 2014)  ondansetron (ZOFRAN) injection 4 mg (4 mg Intravenous Given 07/13/19 2014)  acetaminophen (TYLENOL) suppository 650 mg (650 mg Rectal Given 07/13/19 2015)  sodium chloride 0.9 % bolus 1,000 mL (0 mLs Intravenous Stopped 07/13/19  2216)  lidocaine (XYLOCAINE) 2 % (with pres) injection 200 mg (200 mg Infiltration Given 07/14/19 0026)  iohexol (OMNIPAQUE) 350 MG/ML injection 80 mL (80 mLs Intravenous Contrast Given 07/13/19 2334)    ED Course  I have reviewed the triage vital signs and the nursing notes.  Pertinent labs & imaging results that were available during my care of the patient were reviewed by me and considered in my medical decision making (see chart for details).    MDM Rules/Calculators/A&P                     patient brought to the emergency department for seizure like activity. On ED arrival patient has some shaking of her right upper extremity. She is confused but able to follow commands and answer questions. She is noted to be febrile, concern for possible COVID-19 infection. Lactic acid is elevated but there is no evidence of focal infection on initial evaluation. CT head and C-spine were negative for acute process. Discussed with patient and mother recommendation for lumbar puncture to rule out meningitis in setting of new symptoms. An additional discussion it is noted that patient does have a history of pulmonary embolism when she was  34 years old and did have similar symptoms at that time. CTA PE study was obtained to determine if patient will need anticoagulation. CT is negative for PE but does demonstrate pneumonia. Lumbar puncture performed per procedure note, initially blood-tinged but did turned clear on subsequent tubes. There is a low index of suspicion for subarachnoid hemorrhage. Will start antibiotics for likely community acquired pneumonia. Hospitalist consulted for admission for further treatment. Patient and mother updated findings of studies recommendation for admission and they are in agreement with treatment plan.  KHRYSTINA BONNES was evaluated in Emergency Department on 07/14/2019 for the symptoms described in the history of present illness. She was evaluated in the context of the global COVID-19 pandemic, which necessitated consideration that the patient might be at risk for infection with the SARS-CoV-2 virus that causes COVID-19. Institutional protocols and algorithms that pertain to the evaluation of patients at risk for COVID-19 are in a state of rapid change based on information released by regulatory bodies including the CDC and federal and state organizations. These policies and algorithms were followed during the patient's care in the ED.  Final Clinical Impression(s) / ED Diagnoses Final diagnoses:  Community acquired pneumonia of right middle lobe of lung  AKI (acute kidney injury) (HCC)  Syncope, unspecified syncope type  Altered mental status, unspecified altered mental status type    Rx / DC Orders ED Discharge Orders    None       Tilden Fossa, MD 07/14/19 0111

## 2019-07-13 NOTE — ED Triage Notes (Signed)
Pt arrives BIB [facility], for seizures and fall. Pt became Ox4 a few minutes after arrival to hospital and gave this history: she was in the bathroom (had felt dizzy prior) and fell when she got up from the toliet. The seizures started after the fall per pt.Her mother dragged her to the hallway. EMS stopped seizure with 5 midazolam. postical on arrival, had one episode of hands shaking wherein she could still follow some commands. Pt states she has been having flu-like symptoms the past few weeks with nauesa, vomitting, diahhrea, and cough. She has not been around anyone with COVID and tested negative last week but symptoms started occurring after the test.

## 2019-07-13 NOTE — ED Notes (Signed)
Pt placed on purewick 

## 2019-07-13 NOTE — ED Notes (Signed)
Dr Madilyn Hook informed pt POC covid test neg

## 2019-07-14 ENCOUNTER — Other Ambulatory Visit: Payer: Self-pay

## 2019-07-14 ENCOUNTER — Inpatient Hospital Stay (HOSPITAL_COMMUNITY): Payer: Medicaid Other

## 2019-07-14 DIAGNOSIS — E872 Acidosis: Secondary | ICD-10-CM | POA: Diagnosis not present

## 2019-07-14 DIAGNOSIS — U071 COVID-19: Secondary | ICD-10-CM | POA: Diagnosis present

## 2019-07-14 DIAGNOSIS — Z885 Allergy status to narcotic agent status: Secondary | ICD-10-CM | POA: Diagnosis not present

## 2019-07-14 DIAGNOSIS — E876 Hypokalemia: Secondary | ICD-10-CM | POA: Diagnosis not present

## 2019-07-14 DIAGNOSIS — S199XXA Unspecified injury of neck, initial encounter: Secondary | ICD-10-CM | POA: Diagnosis not present

## 2019-07-14 DIAGNOSIS — Z886 Allergy status to analgesic agent status: Secondary | ICD-10-CM | POA: Diagnosis not present

## 2019-07-14 DIAGNOSIS — R4182 Altered mental status, unspecified: Secondary | ICD-10-CM

## 2019-07-14 DIAGNOSIS — N170 Acute kidney failure with tubular necrosis: Secondary | ICD-10-CM | POA: Diagnosis not present

## 2019-07-14 DIAGNOSIS — Z86711 Personal history of pulmonary embolism: Secondary | ICD-10-CM | POA: Diagnosis not present

## 2019-07-14 DIAGNOSIS — J1289 Other viral pneumonia: Secondary | ICD-10-CM | POA: Diagnosis not present

## 2019-07-14 DIAGNOSIS — R55 Syncope and collapse: Secondary | ICD-10-CM | POA: Diagnosis not present

## 2019-07-14 DIAGNOSIS — J189 Pneumonia, unspecified organism: Secondary | ICD-10-CM | POA: Diagnosis not present

## 2019-07-14 DIAGNOSIS — G9340 Encephalopathy, unspecified: Secondary | ICD-10-CM | POA: Diagnosis not present

## 2019-07-14 DIAGNOSIS — N179 Acute kidney failure, unspecified: Secondary | ICD-10-CM | POA: Diagnosis present

## 2019-07-14 DIAGNOSIS — A419 Sepsis, unspecified organism: Secondary | ICD-10-CM | POA: Diagnosis not present

## 2019-07-14 DIAGNOSIS — R569 Unspecified convulsions: Secondary | ICD-10-CM

## 2019-07-14 DIAGNOSIS — E860 Dehydration: Secondary | ICD-10-CM | POA: Diagnosis not present

## 2019-07-14 DIAGNOSIS — Z880 Allergy status to penicillin: Secondary | ICD-10-CM | POA: Diagnosis not present

## 2019-07-14 DIAGNOSIS — J9601 Acute respiratory failure with hypoxia: Secondary | ICD-10-CM | POA: Diagnosis present

## 2019-07-14 DIAGNOSIS — G40909 Epilepsy, unspecified, not intractable, without status epilepticus: Secondary | ICD-10-CM | POA: Diagnosis not present

## 2019-07-14 DIAGNOSIS — Z23 Encounter for immunization: Secondary | ICD-10-CM | POA: Diagnosis not present

## 2019-07-14 DIAGNOSIS — R509 Fever, unspecified: Secondary | ICD-10-CM | POA: Diagnosis not present

## 2019-07-14 DIAGNOSIS — R000 Tachycardia, unspecified: Secondary | ICD-10-CM | POA: Diagnosis not present

## 2019-07-14 DIAGNOSIS — R079 Chest pain, unspecified: Secondary | ICD-10-CM | POA: Diagnosis not present

## 2019-07-14 DIAGNOSIS — I2721 Secondary pulmonary arterial hypertension: Secondary | ICD-10-CM | POA: Diagnosis not present

## 2019-07-14 DIAGNOSIS — K760 Fatty (change of) liver, not elsewhere classified: Secondary | ICD-10-CM | POA: Diagnosis not present

## 2019-07-14 DIAGNOSIS — J1282 Pneumonia due to coronavirus disease 2019: Secondary | ICD-10-CM | POA: Diagnosis not present

## 2019-07-14 DIAGNOSIS — S0990XA Unspecified injury of head, initial encounter: Secondary | ICD-10-CM | POA: Diagnosis not present

## 2019-07-14 DIAGNOSIS — Z881 Allergy status to other antibiotic agents status: Secondary | ICD-10-CM | POA: Diagnosis not present

## 2019-07-14 LAB — COMPREHENSIVE METABOLIC PANEL
ALT: 23 U/L (ref 0–44)
AST: 31 U/L (ref 15–41)
Albumin: 3 g/dL — ABNORMAL LOW (ref 3.5–5.0)
Alkaline Phosphatase: 37 U/L — ABNORMAL LOW (ref 38–126)
Anion gap: 12 (ref 5–15)
BUN: 16 mg/dL (ref 6–20)
CO2: 18 mmol/L — ABNORMAL LOW (ref 22–32)
Calcium: 7.8 mg/dL — ABNORMAL LOW (ref 8.9–10.3)
Chloride: 107 mmol/L (ref 98–111)
Creatinine, Ser: 1.31 mg/dL — ABNORMAL HIGH (ref 0.44–1.00)
GFR calc Af Amer: >60 mL/min (ref >60)
GFR calc non Af Amer: 54 mL/min — ABNORMAL LOW (ref >60)
Glucose, Bld: 100 mg/dL — ABNORMAL HIGH (ref 70–99)
Potassium: 3.9 mmol/L (ref 3.5–5.1)
Sodium: 137 mmol/L (ref 135–145)
Total Bilirubin: 0.6 mg/dL (ref 0.3–1.2)
Total Protein: 6.4 g/dL — ABNORMAL LOW (ref 6.5–8.1)

## 2019-07-14 LAB — CSF CELL COUNT WITH DIFFERENTIAL
RBC Count, CSF: 13125 /mm3 — ABNORMAL HIGH
RBC Count, CSF: 168 /mm3 — ABNORMAL HIGH
Tube #: 1
Tube #: 4
WBC, CSF: 7 /mm3 — ABNORMAL HIGH (ref 0–5)
WBC, CSF: 8 /mm3 — ABNORMAL HIGH (ref 0–5)

## 2019-07-14 LAB — CBC
HCT: 39.5 % (ref 36.0–46.0)
Hemoglobin: 12.2 g/dL (ref 12.0–15.0)
MCH: 26.9 pg (ref 26.0–34.0)
MCHC: 30.9 g/dL (ref 30.0–36.0)
MCV: 87.2 fL (ref 80.0–100.0)
Platelets: 150 10*3/uL (ref 150–400)
RBC: 4.53 MIL/uL (ref 3.87–5.11)
RDW: 14.6 % (ref 11.5–15.5)
WBC: 4.5 10*3/uL (ref 4.0–10.5)
nRBC: 0 % (ref 0.0–0.2)

## 2019-07-14 LAB — D-DIMER, QUANTITATIVE: D-Dimer, Quant: 2.11 ug/mL-FEU — ABNORMAL HIGH (ref 0.00–0.50)

## 2019-07-14 LAB — ABO/RH: ABO/RH(D): O NEG

## 2019-07-14 LAB — SARS CORONAVIRUS 2 (TAT 6-24 HRS): SARS Coronavirus 2: POSITIVE — AB

## 2019-07-14 LAB — HCG, QUANTITATIVE, PREGNANCY: hCG, Beta Chain, Quant, S: <1 m[IU]/mL (ref <5)

## 2019-07-14 LAB — PROCALCITONIN: Procalcitonin: 0.21 ng/mL

## 2019-07-14 LAB — PROTEIN, CSF: Total  Protein, CSF: 25 mg/dL (ref 15–45)

## 2019-07-14 LAB — HIV ANTIBODY (ROUTINE TESTING W REFLEX): HIV Screen 4th Generation wRfx: NONREACTIVE

## 2019-07-14 LAB — GLUCOSE, CSF: Glucose, CSF: 67 mg/dL (ref 40–70)

## 2019-07-14 LAB — C-REACTIVE PROTEIN: CRP: 3.8 mg/dL — ABNORMAL HIGH (ref <1.0)

## 2019-07-14 MED ORDER — SODIUM CHLORIDE 0.9 % IV SOLN: 100.0000 mg | Freq: Every day | INTRAVENOUS | Status: DC

## 2019-07-14 MED ORDER — ACETAMINOPHEN 325 MG PO TABS
650.0000 mg | ORAL_TABLET | Freq: Four times a day (QID) | ORAL | Status: DC | PRN
  Administered 2019-07-14 – 2019-07-15 (×3): 650 mg via ORAL
  Filled 2019-07-14 (×3): qty 2

## 2019-07-14 MED ORDER — SODIUM CHLORIDE 0.9 % IV SOLN: 1.0000 g | Freq: Once | INTRAVENOUS | Status: DC

## 2019-07-14 MED ORDER — DOXYCYCLINE HYCLATE 100 MG PO TABS
100.0000 mg | ORAL_TABLET | Freq: Two times a day (BID) | ORAL | Status: AC
  Administered 2019-07-14 – 2019-07-18 (×11): 100 mg via ORAL
  Filled 2019-07-14 (×11): qty 1

## 2019-07-14 MED ORDER — ENOXAPARIN SODIUM 40 MG/0.4ML ~~LOC~~ SOLN: 40.0000 mg | SUBCUTANEOUS | Status: DC

## 2019-07-14 MED ORDER — SODIUM CHLORIDE 0.9 % IV SOLN: 2.0000 g | INTRAVENOUS | Status: DC

## 2019-07-14 MED ORDER — HYDROCOD POLST-CPM POLST ER 10-8 MG/5ML PO SUER: 5.0000 mL | Freq: Two times a day (BID) | ORAL | Status: DC | PRN

## 2019-07-14 MED ORDER — SODIUM BICARBONATE 650 MG PO TABS
650.0000 mg | ORAL_TABLET | Freq: Two times a day (BID) | ORAL | Status: AC
  Administered 2019-07-14 (×2): 650 mg via ORAL
  Filled 2019-07-14 (×3): qty 1

## 2019-07-14 MED ORDER — LACTATED RINGERS IV SOLN: INTRAVENOUS | Status: AC

## 2019-07-14 MED ORDER — SODIUM CHLORIDE 0.9 % IV SOLN: 200.0000 mg | Freq: Once | INTRAVENOUS | Status: DC

## 2019-07-14 MED ORDER — GUAIFENESIN-DM 100-10 MG/5ML PO SYRP: 10.0000 mL | ORAL_SOLUTION | ORAL | Status: DC | PRN

## 2019-07-14 MED ORDER — SODIUM CHLORIDE 0.9 % IV SOLN
100.0000 mg | Freq: Every day | INTRAVENOUS | Status: AC
  Administered 2019-07-15 – 2019-07-18 (×4): 100 mg via INTRAVENOUS
  Filled 2019-07-14 (×4): qty 20

## 2019-07-14 MED ORDER — LEVOFLOXACIN IN D5W 750 MG/150ML IV SOLN: 750.0000 mg | INTRAVENOUS | Status: DC

## 2019-07-14 MED ORDER — DEXAMETHASONE 6 MG PO TABS
6.0000 mg | ORAL_TABLET | ORAL | Status: DC
  Administered 2019-07-14 – 2019-07-15 (×2): 6 mg via ORAL
  Filled 2019-07-14: qty 2
  Filled 2019-07-14: qty 1

## 2019-07-14 MED ORDER — ENOXAPARIN SODIUM 40 MG/0.4ML ~~LOC~~ SOLN
40.0000 mg | Freq: Two times a day (BID) | SUBCUTANEOUS | Status: DC
  Administered 2019-07-14 – 2019-07-19 (×11): 40 mg via SUBCUTANEOUS
  Filled 2019-07-14 (×11): qty 0.4

## 2019-07-14 MED ORDER — SODIUM CHLORIDE 0.9 % IV SOLN
100.0000 mg | INTRAVENOUS | Status: AC
  Administered 2019-07-14 (×2): 100 mg via INTRAVENOUS
  Filled 2019-07-14 (×2): qty 20

## 2019-07-14 MED ORDER — SODIUM CHLORIDE 0.9 % IV SOLN: INTRAVENOUS | Status: DC

## 2019-07-14 MED ORDER — SODIUM CHLORIDE 0.9 % IV SOLN
2.0000 g | Freq: Every day | INTRAVENOUS | Status: AC
  Administered 2019-07-14 – 2019-07-18 (×6): 2 g via INTRAVENOUS
  Filled 2019-07-14 (×6): qty 20

## 2019-07-14 MED ORDER — DOXYCYCLINE HYCLATE 100 MG PO TABS: 100.0000 mg | ORAL_TABLET | Freq: Once | ORAL | Status: DC

## 2019-07-14 NOTE — Procedures (Signed)
Patient Name: Gabriella Montgomery  MRN: [id number]  Epilepsy Attending: Charlsie Quest  Referring Physician/Provider: Dr. Susa Raring Date: 07/14/2019 Duration: 25.03 mins  Patient history: 34 year old female, found to be Covid positive also had 2 episodes of seizure-like activity.  EEG to evaluate for seizures.  Level of alertness: Awake, asleep  AEDs during EEG study: None  Technical aspects: This EEG study was done with scalp electrodes positioned according to the 10-20 International system of electrode placement. Electrical activity was acquired at a sampling rate of 500Hz  and reviewed with a high frequency filter of 70Hz  and a low frequency filter of 1Hz . EEG data were recorded continuously and digitally stored.   Description: The posterior dominant rhythm consists of 9 Hz activity of moderate voltage (25-35 uV) seen predominantly in posterior head regions, symmetric and reactive to eye opening and eye closing.  Sleep was characterized by vertex waves, sleep spindles (12 to 14 Hz), maximal frontocentral.  Hyperventilation and photic stimulation were not performed.  IMPRESSION: This study is within normal limits. No seizures or epileptiform discharges were seen throughout the recording.  Legion Discher 

## 2019-07-14 NOTE — ED Notes (Signed)
MD Julian Reil paged with positive Covid results

## 2019-07-14 NOTE — ED Notes (Signed)
Pt. Resting in bed, alert and oriented x 4, c/o pain 9/10 to headached, Tylenol given.

## 2019-07-14 NOTE — ED Notes (Signed)
Pt's respiratory rate climbed to 38-40, O2 sats 89%. Pt placed on 2L [place] for comfort, O2 sats 97%

## 2019-07-14 NOTE — Progress Notes (Signed)
Pharmacy Antibiotic Note  Gabriella Montgomery is a 34 y.o. female admitted on 07/13/2019 with pneumonia.  Pharmacy has been consulted for Levaquin dosing.  Plan: Levaquin 750mg  IV Q24H.  Height: 5\' 5"  (165.1 cm) Weight: 220 lb (99.8 kg) IBW/kg (Calculated) : 57  Temp (24hrs), Avg:102.9 F (39.4 C), Min:102.8 F (39.3 C), Max:102.9 F (39.4 C)  Recent Labs  Lab 07/13/19 1959 07/13/19 2012 07/13/19 2051  WBC  --  6.6  --   CREATININE 1.40* 1.80*  --   LATICACIDVEN  --  3.2* 0.8    Estimated Creatinine Clearance: 53 mL/min (A) (by C-G formula based on SCr of 1.8 mg/dL (H)).    Allergies  Allergen Reactions  . Azithromycin Anaphylaxis  . Amoxicillin Hives    Has patient had a PCN reaction causing immediate rash, facial/tongue/throat swelling, SOB or lightheadedness with hypotension: No Has patient had a PCN reaction causing severe rash involving mucus membranes or skin necrosis: YEs Has patient had a PCN reaction that required hospitalization: No Has patient had a PCN reaction occurring within the last 10 years: No If all of the above answers are "NO", then may proceed with Cephalosporin use.  . Clindamycin Hives  . Ibuprofen Hives  . Tramadol Hives    Thank you for allowing pharmacy to be a part of this patient's care.  07/15/19, PharmD, BCPS  07/14/2019 1:25 AM

## 2019-07-14 NOTE — H&P (Signed)
History and Physical    Gabriella Montgomery ZOX:[id number] DOB: January 27, 1986 DOA: 07/13/2019  PCP: Patient, No Pcp Per  Patient coming from: Home  I have personally briefly reviewed patient's old medical records in [facility]  Chief Complaint:  Seizure-Like activity  HPI: Gabriella Montgomery is a 34 y.o. female with medical history significant of PE (looks like occurred while pregnant, no longer on anticoagulation).  Pt presents to the ED after seizure like activity at home.  Pt ill with flu like symptoms (cough, fever, headache, body aches), for last 3 days.  Prior to this, mother had been ill with similar symptoms for past 2 weeks, though mother improved now.  Pt had further seizure like activity with EMS, got versed, pt improved and more awake.   ED Course: On ED arrival patient has some shaking of her right upper extremity. She is confused but able to follow commands and answer questions.  Tm 102.9.  HR 139.  Lactate 3.2, improved to 0.8 after 2L IVF and HR down to 118.  Tylenol given for fever.  CTA chest: no PE but does have PNA findings. CT abd/pelvis: nothing acute. CT head: neg. MRI brain: pending  WBC nl.  Creat 1.8 up from 1.0 baseline.  UA: neg for infection, does have >300 protein though.  COVID pending  LP: 67 glucose, 25 protein, gramstain: no organisms, does have predominately monos.  Cell count and culture pending.  Review of Systems: As per HPI, otherwise all review of systems negative.  Past Medical History:  Diagnosis Date  . Pulmonary embolism (HCC)     No past surgical history on file.   reports that she has never smoked. She has never used smokeless tobacco. She reports previous alcohol use. She reports that she does not use drugs.  Allergies  Allergen Reactions  . Azithromycin Anaphylaxis  . Amoxicillin Hives    Has patient had a PCN reaction causing immediate rash, facial/tongue/throat swelling, SOB or lightheadedness with hypotension:  No Has patient had a PCN reaction causing severe rash involving mucus membranes or skin necrosis: YEs Has patient had a PCN reaction that required hospitalization: No Has patient had a PCN reaction occurring within the last 10 years: No If all of the above answers are "NO", then may proceed with Cephalosporin use.  . Clindamycin Hives  . Ibuprofen Hives  . Tramadol Hives    No family history on file. Positive for sick contact: Patients mother just had 2 week illness of cough, fever, body aches.  Mother improved now.  Unknown if this was COVID-19  Prior to Admission medications   Medication Sig Start Date End Date Taking? Authorizing Provider  acetaminophen (TYLENOL) 325 MG tablet Take 2 tablets (650 mg total) by mouth every 6 (six) hours as needed. Patient taking differently: Take 650 mg by mouth every 6 (six) hours as needed for mild pain.  04/03/18  Yes Murray, Alyssa B, PA-C  Phenylephrine-Pheniramine-DM (THERAFLU COLD & COUGH PO) Take 5 mLs by mouth daily as needed (For cold and flu symptoms).   Yes [provider]    Physical Exam: Vitals:   07/14/19 0030 07/14/19 0100 07/14/19 0130 07/14/19 0217  BP: 110/75 100/65 124/72 98/67  Pulse: (!) 117 (!) 121 (!) 116 (!) 118  Resp: (!) 21 (!) Temp:    100.2 F (37.9 C)  TempSrc:    Oral  SpO2: 97% 96% 96% 94%  Weight:      Height:  Constitutional: NAD, calm, comfortable Eyes: PERRL, lids and conjunctivae normal ENMT: Mucous membranes are moist. Posterior pharynx clear of any exudate or lesions.Normal dentition.  Neck: normal, supple no meningismus, no masses, no thyromegaly Respiratory: clear to auscultation bilaterally, no wheezing, no crackles. Normal respiratory effort. No accessory muscle use.  Cardiovascular: Regular rate and rhythm, no murmurs / rubs / gallops. No extremity edema. 2+ pedal pulses. No carotid bruits.  Abdomen: no tenderness, no masses palpated. No hepatosplenomegaly. Bowel sounds  positive.  Musculoskeletal: no clubbing / cyanosis. No joint deformity upper and lower extremities. Good ROM, no contractures. Normal muscle tone.  Skin: no rashes, lesions, ulcers. No induration Neurologic: CN 2-12 grossly intact. Sensation intact, DTR normal. Strength 5/5 in all 4.  Psychiatric: Normal judgment and insight. Alert and oriented x 3. Normal mood.    Labs on Admission: I have personally reviewed following labs and imaging studies  CBC: Recent Labs  Lab 07/13/19 1959 07/13/19 2012  WBC  --  6.6  NEUTROABS  --  3.9  HGB 16.0* 15.1*  HCT 47.0* 47.3*  MCV  --  84.8  PLT  --  207   Basic Metabolic Panel: Recent Labs  Lab 07/13/19 1959 07/13/19 2012  NA 138 139  K 4.0 4.1  CL 107 104  CO2  --  21*  GLUCOSE 97 123*  BUN 22* 19  CREATININE 1.40* 1.80*  CALCIUM  --  9.0   GFR: Estimated Creatinine Clearance: 53 mL/min (A) (by C-G formula based on SCr of 1.8 mg/dL (H)). Liver Function Tests: Recent Labs  Lab 07/13/19 2012  AST 35  ALT 27  ALKPHOS 52  BILITOT 0.6  PROT 8.5*  ALBUMIN 3.8   No results for input(s): LIPASE, AMYLASE in the last 168 hours. No results for input(s): AMMONIA in the last 168 hours. Coagulation Profile: Recent Labs  Lab 07/13/19 2012  INR 1.0   Cardiac Enzymes: No results for input(s): CKTOTAL, CKMB, CKMBINDEX, TROPONINI in the last 168 hours. BNP (last 3 results) No results for input(s): PROBNP in the last 8760 hours. HbA1C: No results for input(s): HGBA1C in the last 72 hours. CBG: Recent Labs  Lab 07/13/19 2047  GLUCAP 98   Lipid Profile: No results for input(s): CHOL, HDL, LDLCALC, TRIG, CHOLHDL, LDLDIRECT in the last 72 hours. Thyroid Function Tests: No results for input(s): TSH, T4TOTAL, FREET4, T3FREE, THYROIDAB in the last 72 hours. Anemia Panel: No results for input(s): VITAMINB12, FOLATE, FERRITIN, TIBC, IRON, RETICCTPCT in the last 72 hours. Urine analysis:    Component Value Date/Time   COLORURINE  AMBER (A) 07/13/2019 2317   APPEARANCEUR HAZY (A) 07/13/2019 2317   LABSPEC 1.030 07/13/2019 2317   PHURINE 5.0 07/13/2019 2317   GLUCOSEU NEGATIVE 07/13/2019 2317   HGBUR NEGATIVE 07/13/2019 2317   BILIRUBINUR NEGATIVE 07/13/2019 2317   KETONESUR NEGATIVE 07/13/2019 2317   PROTEINUR >=300 (A) 07/13/2019 2317   NITRITE NEGATIVE 07/13/2019 2317   LEUKOCYTESUR NEGATIVE 07/13/2019 2317    Radiological Exams on Admission: CT Head Wo Contrast  Result Date: 07/13/2019 CLINICAL DATA:  Seizure, fall EXAM: CT HEAD WITHOUT CONTRAST CT CERVICAL SPINE WITHOUT CONTRAST TECHNIQUE: Multidetector CT imaging of the head and cervical spine was performed following the standard protocol without intravenous contrast. Multiplanar CT image reconstructions of the cervical spine were also generated. COMPARISON:  None. FINDINGS: CT HEAD FINDINGS Brain: No acute territorial infarction, hemorrhage or intracranial mass. The ventricles are nonenlarged. Vascular: No hyperdense vessel or unexpected calcification. Skull: Normal. Negative for fracture  or focal lesion. Sinuses/Orbits: Mucosal thickening in the sinuses. Other: None CT CERVICAL SPINE FINDINGS Alignment: Reversal of cervical lordosis. No subluxation. Facet alignment is normal Skull base and vertebrae: No acute fracture. No primary bone lesion or focal pathologic process. Soft tissues and spinal canal: No prevertebral fluid or swelling. No visible canal hematoma. Disc levels:  Within normal limits Upper chest: Negative. Other: None IMPRESSION: 1. Negative non contrasted CT appearance of the brain 2. Reversal of cervical lordosis.  No acute osseous abnormality. Electronically Signed   By: Jasmine PangKim  Fujinaga M.D.   On: 07/13/2019 21:54   CT Angio Chest PE W/Cm &/Or Wo Cm  Result Date: 07/14/2019 CLINICAL DATA:  Left-sided chest and abdominal pain. History of pulmonary embolism. EXAM: CT ANGIOGRAPHY CHEST CT ABDOMEN AND PELVIS WITH CONTRAST TECHNIQUE: Multidetector CT imaging  of the chest was performed using the standard protocol during bolus administration of intravenous contrast. Multiplanar CT image reconstructions and MIPs were obtained to evaluate the vascular anatomy. Multidetector CT imaging of the abdomen and pelvis was performed using the standard protocol during bolus administration of intravenous contrast. CONTRAST:  80mL OMNIPAQUE IOHEXOL 350 MG/ML SOLN COMPARISON:  None. FINDINGS: CTA CHEST FINDINGS Cardiovascular: Pulmonary artery hypertension, the pulmonary trunk measuring 33 mm. No central pulmonary embolism. Patient respiratory motion precludes evaluation of the more peripheral bilateral pulmonary arteries. No thoracic aorta calcified atherosclerosis or aneurysm. Mediastinum/Nodes: Bulky right hilar and subcarinal lymph nodes which are noncalcified and measure up to 18 mm diameter. Normal thyroid and decompressed esophagus. Lungs/Pleura: Patchy alveolar and airspace disease throughout the right lung and most confluent in the right middle lobe with air bronchograms. A couple small patchy ground-glass opacities in the left base. No pleural effusion or pneumothorax. A 4 mm pleural based sub solid nodule along the left major fissure, possibly a reactive subpleural lymph node. Musculoskeletal: Normal. Review of the MIP images confirms the above findings. CT ABDOMEN and PELVIS FINDINGS Hepatobiliary: Fatty infiltration of the hepatic parenchyma. Normal gallbladder and biliary tree. Pancreas: Normal. Spleen: Normal. Adrenals/Urinary Tract: Normal. Stomach/Bowel: Normal appendix, axial series 5, image 63. No mucosal thickening or obstruction. Vascular/Lymphatic: A number of small noncalcified lymph nodes at the mesenteric root, likely reactive, measuring up to 17 by 15 mm. No abdominal aortic aneurysm, dissection or calcified atherosclerosis. Patent portal and hepatic veins. 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 Reproductive:  No apparent abnormality. Other: No retroperitoneal hematoma, free intraperitoneal fluid or air. Musculoskeletal: Normal. The Review of the MIP images confirms the above findings. IMPRESSION: No central pulmonary embolism. Respiratory motion artifact precludes exclusion of a peripheral pulmonary embolism. Patchy alveolar and airspace disease in the right lung, most confluent in the right middle lobe, likely pneumonia. A couple small focal alveolar opacities in the left base, also probably infectious. Unenhanced CT chest recommended in 3 months to document resolution. Bulky right hilar and subcarinal lymph nodes, likely reactive. Pulmonary artery hypertension. Hepatic steatosis. A number of small noncalcified lymph nodes at the mesenteric root, likely a reactive adenitis. Electronically Signed   By: Laurence Ferrariachel  [place]   On: 07/14/2019 00:14   CT Cervical Spine Wo Contrast  Result Date: 07/13/2019 CLINICAL DATA:  Seizure, fall EXAM: CT HEAD WITHOUT CONTRAST CT CERVICAL SPINE WITHOUT CONTRAST TECHNIQUE: Multidetector CT imaging of the head and cervical spine was performed following the standard protocol without intravenous contrast. Multiplanar CT image reconstructions of the cervical spine were also generated. COMPARISON:  None. FINDINGS: CT HEAD FINDINGS  Brain: No acute territorial infarction, hemorrhage or intracranial mass. The ventricles are nonenlarged. Vascular: No hyperdense vessel or unexpected calcification. Skull: Normal. Negative for fracture or focal lesion. Sinuses/Orbits: Mucosal thickening in the sinuses. Other: None CT CERVICAL SPINE FINDINGS Alignment: Reversal of cervical lordosis. No subluxation. Facet alignment is normal Skull base and vertebrae: No acute fracture. No primary bone lesion or focal pathologic process. Soft tissues and spinal canal: No prevertebral fluid or swelling. No visible canal hematoma. Disc levels:  Within normal limits Upper chest: Negative. Other: None IMPRESSION: 1. Negative  non contrasted CT appearance of the brain 2. Reversal of cervical lordosis.  No acute osseous abnormality. Electronically Signed   By: Jasmine Pang M.D.   On: 07/13/2019 21:54   CT Abdomen Pelvis W Contrast  Result Date: 07/14/2019 CLINICAL DATA:  Left-sided chest and abdominal pain. History of pulmonary embolism. EXAM: CT ANGIOGRAPHY CHEST CT ABDOMEN AND PELVIS WITH CONTRAST TECHNIQUE: Multidetector CT imaging of the chest was performed using the standard protocol during bolus administration of intravenous contrast. Multiplanar CT image reconstructions and MIPs were obtained to evaluate the vascular anatomy. Multidetector CT imaging of the abdomen and pelvis was performed using the standard protocol during bolus administration of intravenous contrast. CONTRAST:  46mL OMNIPAQUE IOHEXOL 350 MG/ML SOLN COMPARISON:  None. FINDINGS: CTA CHEST FINDINGS Cardiovascular: Pulmonary artery hypertension, the pulmonary trunk measuring 33 mm. No central pulmonary embolism. Patient respiratory motion precludes evaluation of the more peripheral bilateral pulmonary arteries. No thoracic aorta calcified atherosclerosis or aneurysm. Mediastinum/Nodes: Bulky right hilar and subcarinal lymph nodes which are noncalcified and measure up to 18 mm diameter. Normal thyroid and decompressed esophagus. Lungs/Pleura: Patchy alveolar and airspace disease throughout the right lung and most confluent in the right middle lobe with air bronchograms. A couple small patchy ground-glass opacities in the left base. No pleural effusion or pneumothorax. A 4 mm pleural based sub solid nodule along the left major fissure, possibly a reactive subpleural lymph node. Musculoskeletal: Normal. Review of the MIP images confirms the above findings. CT ABDOMEN and PELVIS FINDINGS Hepatobiliary: Fatty infiltration of the hepatic parenchyma. Normal gallbladder and biliary tree. Pancreas: Normal. Spleen: Normal. Adrenals/Urinary Tract: Normal. Stomach/Bowel:  Normal appendix, axial series 5, image 63. No mucosal thickening or obstruction. Vascular/Lymphatic: A number of small noncalcified lymph nodes at the mesenteric root, likely reactive, measuring up to 17 by 15 mm. No abdominal aortic aneurysm, dissection or calcified atherosclerosis. Patent portal and hepatic veins. 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 Reproductive: No apparent abnormality. Other: No retroperitoneal hematoma, free intraperitoneal fluid or air. Musculoskeletal: Normal. The Review of the MIP images confirms the above findings. IMPRESSION: No central pulmonary embolism. Respiratory motion artifact precludes exclusion of a peripheral pulmonary embolism. Patchy alveolar and airspace disease in the right lung, most confluent in the right middle lobe, likely pneumonia. A couple small focal alveolar opacities in the left base, also probably infectious. Unenhanced CT chest recommended in 3 months to document resolution. Bulky right hilar and subcarinal lymph nodes, likely reactive. Pulmonary artery hypertension. Hepatic steatosis. A number of small noncalcified lymph nodes at the mesenteric root, likely a reactive adenitis. Electronically Signed   By: Laurence Ferrari   On: 07/14/2019 00:14   DG Chest Port 1 View  Result Date: 07/13/2019 CLINICAL DATA:  Fever.  Altered mental status. EXAM: PORTABLE CHEST 1 VIEW COMPARISON:  None. FINDINGS: Cardiac silhouette normal in size. No mediastinal or hilar masses or evidence of  adenopathy. Mild linear/interstitial type opacities right lower lung, which may reflect atelectasis. Lungs otherwise clear. No pleural effusion or pneumothorax. Skeletal structures are intact. IMPRESSION: 1. Mild area linear/interstitial opacity at the right lung base, most likely atelectasis and crowding due to low lung volumes and patient rotation. A small focus pneumonia is possible. No other abnormality. Electronically Signed   By:  Amie Portland M.D.   On: 07/13/2019 19:34    EKG: Independently reviewed.  Assessment/Plan Principal Problem:   Sepsis due to pneumonia [facility]) Active Problems:   CAP (community acquired pneumonia)   Seizure-like activity (HCC)   AKI (acute kidney injury) (HCC)    1. Sepsis due to PNA - 1. CAP vs COVID 2. PNA pathway 3. Empiric rocephin / doxycycline 1. Seems to be tolerating rocephin without reaction at this time 2. Didn't want to use Levaquin due to: 1) lowering seizure threshold and 2) pt already has AKI 4. Cultures pending 5. COVID test pending 1. Get RVP if negative, though havent really seen flu this year 6. No O2 requirement currently 7. Tylenol PRN fever 8. IVF: 2L bolus in ED then 100 cc/hr NS 2. AKI - 1. Due to sepsis / ATN 2. IVF as above 3. Strict intake and output 4. Repeat BMP in AM 3. Seizure like activity - 1. Febrile seizure? 2. Partial seizure? 3. Pt got versed 4. Currently AAOx3 5. EDP spoke with neuro who just recd MRI for the moment 6. Seizure precautions 7. Tele monitor 8. LP 1. cell count and culture pending, note that it was a traumatic tap (bloody initially but clearing) as EDP describes in note 2. GS no organisms 3. Protein not elevated 4. Glucose nl 5. No headache no meningismus currently 6. Meningitis seems less likely overall as a result  DVT prophylaxis: Lovenox Code Status: Full Family Communication: Mother at bedside (just spent 2 weeks sick with COVID like illness) Disposition Plan: Home after admit Consults called: None Admission status: Admit to inpatient  Severity of Illness: The appropriate patient status for this patient is INPATIENT. Inpatient status is judged to be reasonable and necessary in order to provide the required intensity of service to ensure the patient's safety. The patient's presenting symptoms, physical exam findings, and initial radiographic and laboratory data in the context of their chronic comorbidities is  felt to place them at high risk for further clinical deterioration. Furthermore, it is not anticipated that the patient will be medically stable for discharge from the hospital within 2 midnights of admission. The following factors support the patient status of inpatient.   IP status due to PNA complicated by seizure-like activity.   * I certify that at the point of admission it is my clinical judgment that the patient will require inpatient hospital care spanning beyond 2 midnights from the point of admission due to high intensity of service, high risk for further deterioration and high frequency of surveillance required.*    Gabriella Montgomery M. DO [facility]  How to contact the [facility] Attending or Consulting provider 7A - 7P or covering provider during after hours 7P -7A, for this patient?  1. Check the care team in [facility] and look for a) attending/consulting [facility] provider listed and b) the [facility] team listed 2. Log into [email or web address]  Amion Physician Scheduling and messaging for groups and whole hospitals  On call and physician scheduling software for group practices, residents, hospitalists and other medical providers for call, clinic, rotation and shift schedules. On[employer] is a hospital-wide system  for scheduling doctors and paging doctors on call. EasyPlot is for scientific plotting and data analysis.  [email or web address]  and use [place]'s universal password to access. If you do not have the password, please contact the hospital operator.  3. Locate the [facility] provider you are looking for under [facility] and page to a number that you can be directly reached. 4. If you still have difficulty reaching the provider, please page the [facility] (Director on Call) for the Hospitalists listed on amion for assistance.  07/14/2019, 2:37 AM

## 2019-07-14 NOTE — Progress Notes (Addendum)
PROGRESS NOTE                                                                                                                                                                                                             Patient Demographics:    Gabriella Montgomery, is a 34 y.o. female, DOB - December 01, 1985, SCB:[id number]  Outpatient Primary MD for the patient is Patient, No Pcp Per    LOS - 0  Admit date - 07/13/2019    CC - Fever     Brief Narrative - Gabriella Montgomery is a 34 y.o. female with medical history significant of PE (looks like occurred while pregnant, no longer on anticoagulation).  Patient apparently was having cough high fevers and some body aches for the last few days, on the day of hospital visit she had 2 episodes of seizure-like activity after which she was admitted to the hospital, note 1 episode happened in the ER.  Was found to have COVID-19 pneumonia, her LP was negative, CT head, CT C-spine, MRI brain were nonacute.     Subjective:    Gabriella Montgomery today has, No headache, No chest pain, No abdominal pain - No Nausea, No new weakness tingling or numbness, no Cough - SOB.     Assessment  & Plan :     1.  Acute Covid 19 Viral Pneumonitis during the ongoing 2020 Covid 19 Pandemic - she has moderate disease has been started on steroids and remdesivir.  Will monitor inflammatory markers, advance activity and monitor closely clinically.  Encouraged the patient to sit up in chair in the daytime use I-S and flutter valve for pulmonary toiletry and then prone in bed when at night.     SpO2: 99 %  Recent Labs  Lab 07/13/19 2133 07/14/19 0557  CRP  --  3.8*  DDIMER  --  2.11*  PROCALCITON  --  0.21  SARSCOV2NAA POSITIVE*  --     Hepatic Function Latest Ref Rng & Units 07/14/2019 07/13/2019 09/13/2017  Total Protein 6.5 - 8.1 g/dL 6.4(L) 8.5(H) 8.0  Albumin 3.5 - 5.0 g/dL 3.0(L) 3.8 4.1  AST 15 - 41 U/L 31 35 17   ALT 0 - 44 U/L _0 Alk Phosphatase 38 - 126 U/L 37(L) 52 51  Total Bilirubin 0.3 - 1.2 mg/dL 0.6 0.6  0.5     2.  Possible seizure-like activity.  Brain imaging including CT and MRI nonacute, patient headache free, LP reveals CSF which is consistent with a bloody tap but no infection, will order EEG.  Unclear if she had a true seizure-like episode or not, case discussed with neurologist Dr. Leonel Ramsay, patient will get 6 months driving probation upon discharge.  If all other work-up negative no medications at this time.  3.  Fever related dehydration and AKI and metabolic acidosis.  Improving with IV fluids, continue hydration.  4.  Possible community-acquired pneumonia with interstitial infiltrates and some mention of focal infiltrates on CT.  Will continue Rocephin-doxycycline combination and monitor.   Serum hCG ordered, patient states she is not sexually active.    Condition - Fair  Family Communication  :  Mother - [phone] on 07/14/19   Code Status :  Full  Diet :   Diet Order            Diet regular Room service appropriate? Yes; Fluid consistency: Thin  Diet effective now               Disposition Plan  : Stay in the hospital for treatment of COVID-19 pneumonia  Consults  : None  Procedures  :    CTA -  No central pulmonary embolism. Respiratory motion artifact precludes exclusion of a peripheral pulmonary embolism. Patchy alveolar and airspace disease in the right lung, most confluent in the right middle lobe, likely pneumonia. A couple small focal alveolar opacities in the left base, also probably infectious. Unenhanced CT chest recommended in 3 months to document resolution. Bulky right hilar and subcarinal lymph nodes, likely reactive. Pulmonary artery hypertension. Hepatic steatosis. A number of small noncalcified lymph nodes at the mesenteric root, likely a reactive adenitis.  MRI Brain - Non acute  CT C spine - Non acute   PUD Prophylaxis :  None  DVT Prophylaxis  :  Lovenox    Lab Results  Component Value Date   PLT 150 07/14/2019    Inpatient Medications  Scheduled Meds: . dexamethasone  6 mg Oral Q24H  . doxycycline  100 mg Oral Q12H  . enoxaparin (LOVENOX) injection  40 mg Subcutaneous Q12H  . sodium bicarbonate  650 mg Oral BID   Continuous Infusions: . cefTRIAXone (ROCEPHIN)  IV Stopped (07/14/19 0215)  . lactated ringers 125 mL/hr at 07/14/19 1148  . [START ON 07/15/2019] remdesivir 100 mg in NS 100 mL     PRN Meds:.acetaminophen, chlorpheniramine-HYDROcodone, guaiFENesin-dextromethorphan  Antibiotics  :    Anti-infectives (From admission, onward)   Start     Dose/Rate Route Frequency Ordered Stop   07/15/19 1000  remdesivir 100 mg in sodium chloride 0.9 % 100 mL IVPB  Status:  Discontinued     100 mg 200 mL/hr over 30 Minutes Intravenous Daily 07/14/19 0511 07/14/19 0514   07/15/19 1000  remdesivir 100 mg in sodium chloride 0.9 % 100 mL IVPB     100 mg 200 mL/hr over 30 Minutes Intravenous Daily 07/14/19 0515 07/19/19 0959   07/14/19 0600  remdesivir 100 mg in sodium chloride 0.9 % 100 mL IVPB     100 mg 200 mL/hr over 30 Minutes Intravenous Every 1 hr x 2 07/14/19 0515 07/14/19 0746   07/14/19 0515  remdesivir 200 mg in sodium chloride 0.9% 250 mL IVPB  Status:  Discontinued     200 mg 580 mL/hr over 30 Minutes Intravenous Once 07/14/19 0511 07/14/19 0514  07/14/19 0200  levofloxacin (LEVAQUIN) IVPB 750 mg  Status:  Discontinued     750 mg 100 mL/hr over 90 Minutes Intravenous Every 24 hours 07/14/19 0124 07/14/19 0127   07/14/19 0130  cefTRIAXone (ROCEPHIN) 2 g in sodium chloride 0.9 % 100 mL IVPB     2 g 200 mL/hr over 30 Minutes Intravenous Daily at bedtime 07/14/19 0124     07/14/19 0115  cefTRIAXone (ROCEPHIN) 1 g in sodium chloride 0.9 % 100 mL IVPB  Status:  Discontinued     1 g 200 mL/hr over 30 Minutes Intravenous  Once 07/14/19 0105 07/14/19 0114   07/14/19 0115  doxycycline  (VIBRA-TABS) tablet 100 mg  Status:  Discontinued     100 mg Oral  Once 07/14/19 0105 07/14/19 0114   07/14/19 0115  cefTRIAXone (ROCEPHIN) 2 g in sodium chloride 0.9 % 100 mL IVPB  Status:  Discontinued     2 g 200 mL/hr over 30 Minutes Intravenous Every 24 hours 07/14/19 0114 07/14/19 0118   07/14/19 0115  doxycycline (VIBRA-TABS) tablet 100 mg     100 mg Oral Every 12 hours 07/14/19 0114         Time Spent in minutes  30   Lala Lund M.D on 07/14/2019 at 1:31 PM  To page go to [email or web address] - password [facility]  [facility] -  Office  [phone]    See all Orders from today for further details    Objective:   Vitals:   07/14/19 0756 07/14/19 0830 07/14/19 0900 07/14/19 1200  BP:  113/81 112/74 131/75  Pulse:  (!) 112 (!) 115 100  Resp:  (!) 34 (!) 27 (!) 25  Temp:      TempSrc:      SpO2:  97% 99% 99%  Weight: 99.8 kg     Height: '5\' 5"'$  (1.651 m)       Wt Readings from Last 3 Encounters:  07/14/19 99.8 kg  06/30/18 99.8 kg     Intake/Output Summary (Last 24 hours) at 07/14/2019 1331 Last data filed at 07/14/2019 0902 Gross per 24 hour  Intake 1379.42 ml  Output 200 ml  Net 1179.42 ml     Physical Exam  Awake Alert, No new F.N deficits, Normal affect [place].AT,PERRAL Supple Neck,No JVD, No cervical lymphadenopathy appriciated.  Symmetrical Chest wall movement, Good air movement bilaterally, CTAB RRR,No Gallops,Rubs or new Murmurs, No Parasternal Heave +ve B.Sounds, Abd Soft, No tenderness, No organomegaly appriciated, No rebound - guarding or rigidity. No Cyanosis, Clubbing or edema, No new Rash or bruise       Data Review:    CBC Recent Labs  Lab 07/13/19 1959 07/13/19 2012 07/14/19 0236  WBC  --  6.6 4.5  HGB 16.0* 15.1* 12.2  HCT 47.0* 47.3* 39.5  PLT  --  207 150  MCV  --  84.8 87.2  MCH  --  27.1 26.9  MCHC  --  31.9 30.9  RDW  --  14.5 14.6  LYMPHSABS  --  2.6  --   MONOABS  --  0.1  --   EOSABS  --  0.0  --   BASOSABS  --   0.0  --     Chemistries  Recent Labs  Lab 07/13/19 1959 07/13/19 2012 07/14/19 0236  NA 138 139 137  K 4.0 4.1 3.9  CL 107 104 107  CO2  --  21* 18*  GLUCOSE 97 123* 100*  BUN 22* 19 16  CREATININE 1.40*  1.80* 1.31*  CALCIUM  --  9.0 7.8*  AST  --  35 31  ALT  --  27 23  ALKPHOS  --  52 37*  BILITOT  --  0.6 0.6  INR  --  1.0  --      ------------------------------------------------------------------------------------------------------------------ No results for input(s): CHOL, HDL, LDLCALC, TRIG, CHOLHDL, LDLDIRECT in the last 72 hours.  No results found for: HGBA1C ------------------------------------------------------------------------------------------------------------------ No results for input(s): TSH, T4TOTAL, T3FREE, THYROIDAB in the last 72 hours.  Invalid input(s): FREET3  Cardiac Enzymes No results for input(s): CKMB, TROPONINI, MYOGLOBIN in the last 168 hours.  Invalid input(s): CK ------------------------------------------------------------------------------------------------------------------ No results found for: BNP  Micro Results Recent Results (from the past 240 hour(s))  Blood Culture (routine x 2)     Status: None (Preliminary result)   Collection Time: 07/13/19  8:11 PM   Specimen: BLOOD  Result Value Ref Range Status   Specimen Description BLOOD BLOOD RIGHT FOREARM  Final   Special Requests   Final    BOTTLES DRAWN AEROBIC AND ANAEROBIC Blood Culture adequate volume   Culture   Final    NO GROWTH < 24 HOURS Performed at [facility], [place] [address] [place], [place] [address]    Report Status PENDING  Incomplete  Blood Culture (routine x 2)     Status: None (Preliminary result)   Collection Time: 07/13/19  9:07 PM   Specimen: BLOOD LEFT HAND  Result Value Ref Range Status   Specimen Description BLOOD LEFT HAND  Final   Special Requests   Final    BOTTLES DRAWN AEROBIC AND ANAEROBIC Blood Culture results may not be optimal due to  an inadequate volume of blood received in culture bottles   Culture   Final    NO GROWTH < 12 HOURS Performed at [facility], [place] [address] [place], [place] [address]    Report Status PENDING  Incomplete  SARS CORONAVIRUS 2 (TAT 6-24 HRS) Nasopharyngeal Nasopharyngeal Swab     Status: Abnormal   Collection Time: 07/13/19  9:33 PM   Specimen: Nasopharyngeal Swab  Result Value Ref Range Status   SARS Coronavirus 2 POSITIVE (A) NEGATIVE Final    Comment: RESULT CALLED TO, READ BACK BY AND VERIFIED WITH: RN MEGAN RUGGIERO AT 0500 BY [place] ON 07/14/2019 (NOTE) SARS-CoV-2 target nucleic acids are DETECTED. The SARS-CoV-2 RNA is generally detectable in upper and lower respiratory specimens during the acute phase of infection. Positive results are indicative of the presence of SARS-CoV-2 RNA. Clinical correlation with patient history and other diagnostic information is  necessary to determine patient infection status. Positive results do not rule out bacterial infection or co-infection with other viruses.  The expected result is Negative. Fact Sheet for Patients: SugarRoll.be Fact Sheet for Healthcare Providers: https://www.woods-mathews.com/ This test is not yet approved or cleared by the [place] FDA and  has been authorized for detection and/or diagnosis of SARS-CoV-2 by FDA under an Emergency Use Authorization (EUA). This EUA will remain  in effect (meaning this t est can be used) for the duration of the COVID-19 declaration under Section 564(b)(1) of the Act, 21 U.S.C. section 360bbb-3(b)(1), unless the authorization is terminated or revoked sooner. Performed at [facility], [place] [address], [place], [place] [address]   CSF culture     Status: None (Preliminary result)   Collection Time: 07/14/19  1:08 AM   Specimen: Back; Cerebrospinal Fluid  Result Value Ref Range Status  Specimen Description BACK  Final    Special Requests NONE  Final   Gram Stain   Final    CYTOSPIN SMEAR WBC PRESENT, PREDOMINANTLY MONONUCLEAR NO ORGANISMS SEEN Performed at [facility], [place] [address] [place], [place] [address]    Culture PENDING  Incomplete   Report Status PENDING  Incomplete    Radiology Reports CT Head Wo Contrast  Result Date: 07/13/2019 CLINICAL DATA:  Seizure, fall EXAM: CT HEAD WITHOUT CONTRAST CT CERVICAL SPINE WITHOUT CONTRAST TECHNIQUE: Multidetector CT imaging of the head and cervical spine was performed following the standard protocol without intravenous contrast. Multiplanar CT image reconstructions of the cervical spine were also generated. COMPARISON:  None. FINDINGS: CT HEAD FINDINGS Brain: No acute territorial infarction, hemorrhage or intracranial mass. The ventricles are nonenlarged. Vascular: No hyperdense vessel or unexpected calcification. Skull: Normal. Negative for fracture or focal lesion. Sinuses/Orbits: Mucosal thickening in the sinuses. Other: None CT CERVICAL SPINE FINDINGS Alignment: Reversal of cervical lordosis. No subluxation. Facet alignment is normal Skull base and vertebrae: No acute fracture. No primary bone lesion or focal pathologic process. Soft tissues and spinal canal: No prevertebral fluid or swelling. No visible canal hematoma. Disc levels:  Within normal limits Upper chest: Negative. Other: None IMPRESSION: 1. Negative non contrasted CT appearance of the brain 2. Reversal of cervical lordosis.  No acute osseous abnormality. Electronically Signed   By: Donavan Foil M.D.   On: 07/13/2019 21:54   CT Angio Chest PE W/Cm &/Or Wo Cm  Result Date: 07/14/2019 CLINICAL DATA:  Left-sided chest and abdominal pain. History of pulmonary embolism. EXAM: CT ANGIOGRAPHY CHEST CT ABDOMEN AND PELVIS WITH CONTRAST TECHNIQUE: Multidetector CT imaging of the chest was performed using the standard protocol during bolus administration of intravenous contrast. Multiplanar CT image  reconstructions and MIPs were obtained to evaluate the vascular anatomy. Multidetector CT imaging of the abdomen and pelvis was performed using the standard protocol during bolus administration of intravenous contrast. CONTRAST:  43m OMNIPAQUE IOHEXOL 350 MG/ML SOLN COMPARISON:  None. FINDINGS: CTA CHEST FINDINGS Cardiovascular: Pulmonary artery hypertension, the pulmonary trunk measuring 33 mm. No central pulmonary embolism. Patient respiratory motion precludes evaluation of the more peripheral bilateral pulmonary arteries. No thoracic aorta calcified atherosclerosis or aneurysm. Mediastinum/Nodes: Bulky right hilar and subcarinal lymph nodes which are noncalcified and measure up to 18 mm diameter. Normal thyroid and decompressed esophagus. Lungs/Pleura: Patchy alveolar and airspace disease throughout the right lung and most confluent in the right middle lobe with air bronchograms. A couple small patchy ground-glass opacities in the left base. No pleural effusion or pneumothorax. A 4 mm pleural based sub solid nodule along the left major fissure, possibly a reactive subpleural lymph node. Musculoskeletal: Normal. Review of the MIP images confirms the above findings. CT ABDOMEN and PELVIS FINDINGS Hepatobiliary: Fatty infiltration of the hepatic parenchyma. Normal gallbladder and biliary tree. Pancreas: Normal. Spleen: Normal. Adrenals/Urinary Tract: Normal. Stomach/Bowel: Normal appendix, axial series 5, image 63. No mucosal thickening or obstruction. Vascular/Lymphatic: A number of small noncalcified lymph nodes at the mesenteric root, likely reactive, measuring up to 17 by 15 mm. No abdominal aortic aneurysm, dissection or calcified atherosclerosis. Patent portal and hepatic veins. 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 Reproductive: No apparent abnormality. Other: No retroperitoneal hematoma, free intraperitoneal fluid or air. Musculoskeletal: Normal. The  Review of the MIP images confirms the above findings. IMPRESSION: No central pulmonary embolism. Respiratory motion artifact precludes exclusion of a  peripheral pulmonary embolism. Patchy alveolar and airspace disease in the right lung, most confluent in the right middle lobe, likely pneumonia. A couple small focal alveolar opacities in the left base, also probably infectious. Unenhanced CT chest recommended in 3 months to document resolution. Bulky right hilar and subcarinal lymph nodes, likely reactive. Pulmonary artery hypertension. Hepatic steatosis. A number of small noncalcified lymph nodes at the mesenteric root, likely a reactive adenitis. Electronically Signed   By: Revonda Humphrey   On: 07/14/2019 00:14   CT Cervical Spine Wo Contrast  Result Date: 07/13/2019 CLINICAL DATA:  Seizure, fall EXAM: CT HEAD WITHOUT CONTRAST CT CERVICAL SPINE WITHOUT CONTRAST TECHNIQUE: Multidetector CT imaging of the head and cervical spine was performed following the standard protocol without intravenous contrast. Multiplanar CT image reconstructions of the cervical spine were also generated. COMPARISON:  None. FINDINGS: CT HEAD FINDINGS Brain: No acute territorial infarction, hemorrhage or intracranial mass. The ventricles are nonenlarged. Vascular: No hyperdense vessel or unexpected calcification. Skull: Normal. Negative for fracture or focal lesion. Sinuses/Orbits: Mucosal thickening in the sinuses. Other: None CT CERVICAL SPINE FINDINGS Alignment: Reversal of cervical lordosis. No subluxation. Facet alignment is normal Skull base and vertebrae: No acute fracture. No primary bone lesion or focal pathologic process. Soft tissues and spinal canal: No prevertebral fluid or swelling. No visible canal hematoma. Disc levels:  Within normal limits Upper chest: Negative. Other: None IMPRESSION: 1. Negative non contrasted CT appearance of the brain 2. Reversal of cervical lordosis.  No acute osseous abnormality. Electronically  Signed   By: Donavan Foil M.D.   On: 07/13/2019 21:54   MR BRAIN WO CONTRAST  Result Date: 07/14/2019 CLINICAL DATA:  Encephalopathy. Additional history provided: Flu like symptoms, seizure-like activity EXAM: MRI HEAD WITHOUT CONTRAST TECHNIQUE: Multiplanar, multiecho pulse sequences of the brain and surrounding structures were obtained without intravenous contrast. COMPARISON:  CT head 07/13/2019 FINDINGS: Brain: There is no evidence of acute infarct. No evidence of intracranial mass. No midline shift or extra-axial fluid collection. No chronic intracranial blood products. There is a single punctate focus of T2/FLAIR hyperintensity within the right frontal lobe white matter which is nonspecific and of doubtful clinical significance (series 12, image 16). No other focal parenchymal signal abnormality is identified. The hippocampi are symmetric in size and signal. Cerebral volume is normal for age. Partially empty sella turcica. Mildly low-lying cerebellar tonsils extending 3 mm below the level of the foramen magnum. Vascular: Flow voids maintained within the proximal large arterial vessels. Skull and upper cervical spine: No focal marrow lesion. Sinuses/Orbits: Visualized orbits demonstrate no acute abnormality. Moderate bilateral sphenoid sinus mucosal thickening. Mild mucosal thickening within the remainder of the paranasal sinuses. Small bilateral mastoid effusions. IMPRESSION: 1. No evidence of acute intracranial abnormality. 2. No specific seizure focus is identified. 3. Mildly low-lying cerebellar tonsils extending 3 mm, likely developmental. 4. Paranasal sinus mucosal thickening greatest within the bilateral sphenoid sinuses. 5. Small bilateral mastoid effusions. Electronically Signed   By: Kellie Simmering DO   On: 07/14/2019 10:50   CT Abdomen Pelvis W Contrast  Result Date: 07/14/2019 CLINICAL DATA:  Left-sided chest and abdominal pain. History of pulmonary embolism. EXAM: CT ANGIOGRAPHY CHEST CT  ABDOMEN AND PELVIS WITH CONTRAST TECHNIQUE: Multidetector CT imaging of the chest was performed using the standard protocol during bolus administration of intravenous contrast. Multiplanar CT image reconstructions and MIPs were obtained to evaluate the vascular anatomy. Multidetector CT imaging of the abdomen and pelvis was performed using the standard protocol during bolus  administration of intravenous contrast. CONTRAST:  45m OMNIPAQUE IOHEXOL 350 MG/ML SOLN COMPARISON:  None. FINDINGS: CTA CHEST FINDINGS Cardiovascular: Pulmonary artery hypertension, the pulmonary trunk measuring 33 mm. No central pulmonary embolism. Patient respiratory motion precludes evaluation of the more peripheral bilateral pulmonary arteries. No thoracic aorta calcified atherosclerosis or aneurysm. Mediastinum/Nodes: Bulky right hilar and subcarinal lymph nodes which are noncalcified and measure up to 18 mm diameter. Normal thyroid and decompressed esophagus. Lungs/Pleura: Patchy alveolar and airspace disease throughout the right lung and most confluent in the right middle lobe with air bronchograms. A couple small patchy ground-glass opacities in the left base. No pleural effusion or pneumothorax. A 4 mm pleural based sub solid nodule along the left major fissure, possibly a reactive subpleural lymph node. Musculoskeletal: Normal. Review of the MIP images confirms the above findings. CT ABDOMEN and PELVIS FINDINGS Hepatobiliary: Fatty infiltration of the hepatic parenchyma. Normal gallbladder and biliary tree. Pancreas: Normal. Spleen: Normal. Adrenals/Urinary Tract: Normal. Stomach/Bowel: Normal appendix, axial series 5, image 63. No mucosal thickening or obstruction. Vascular/Lymphatic: A number of small noncalcified lymph nodes at the mesenteric root, likely reactive, measuring up to 17 by 15 mm. No abdominal aortic aneurysm, dissection or calcified atherosclerosis. Patent portal and hepatic veins. Congenital mild mass effect of the  right common iliac artery on the left common iliac vein. Reproductive: No apparent abnormality. Other: No retroperitoneal hematoma, free intraperitoneal fluid or air. Musculoskeletal: Normal. The Review of the MIP images confirms the above findings. IMPRESSION: No central pulmonary embolism. Respiratory motion artifact precludes exclusion of a peripheral pulmonary embolism. Patchy alveolar and airspace disease in the right lung, most confluent in the right middle lobe, likely pneumonia. A couple small focal alveolar opacities in the left base, also probably infectious. Unenhanced CT chest recommended in 3 months to document resolution. Bulky right hilar and subcarinal lymph nodes, likely reactive. Pulmonary artery hypertension. Hepatic steatosis. A number of small noncalcified lymph nodes at the mesenteric root, likely a reactive adenitis. Electronically Signed   By: RRevonda Humphrey  On: 07/14/2019 00:14   DG Chest Port 1 View  Result Date: 07/13/2019 CLINICAL DATA:  Fever.  Altered mental status. EXAM: PORTABLE CHEST 1 VIEW COMPARISON:  None. FINDINGS: Cardiac silhouette normal in size. No mediastinal or hilar masses or evidence of adenopathy. Mild linear/interstitial type opacities right lower lung, which may reflect atelectasis. Lungs otherwise clear. No pleural effusion or pneumothorax. Skeletal structures are intact. IMPRESSION: 1. Mild area linear/interstitial opacity at the right lung base, most likely atelectasis and crowding due to low lung volumes and patient rotation. A small focus pneumonia is possible. No other abnormality. Electronically Signed   By: DLajean ManesM.D.   On: 07/13/2019 19:34

## 2019-07-14 NOTE — Progress Notes (Signed)
Not surprisingly patients COVID has come back positive, also just got put on Oxygen via [place] (new oxygen requirement).  1) Starting remdesivir 2) starting decadron 3) stopping maint IVF for the moment 4) checking CRP, D.Dimer, and procalcitonin 5) stop ABx if procalcitonin neg 6) consider actemra depending on CRP results 7) adjust lovenox based on D.Dimer results, no PE today on CTA earlier (just PNA).

## 2019-07-14 NOTE — ED Notes (Signed)
Patient transported to MRI 

## 2019-07-14 NOTE — ED Notes (Signed)
SDU  Breakfast ordered  

## 2019-07-14 NOTE — ED Notes (Signed)
MD Julian Reil returned call, VRBO to discontinue fluids, he will place order for steroids & antiviral. This RN will administer per order

## 2019-07-14 NOTE — ED Notes (Signed)
Pt. Up to bedside commode, gait steady

## 2019-07-14 NOTE — Progress Notes (Signed)
EEG complete - results pending 

## 2019-07-14 NOTE — ED Notes (Signed)
Request for hospital bed made 

## 2019-07-14 NOTE — ED Notes (Signed)
Lab called and will add on HCG, quantitative, pregnancy

## 2019-07-14 NOTE — ED Triage Notes (Signed)
Jasmine RN called and given report

## 2019-07-15 ENCOUNTER — Encounter (HOSPITAL_COMMUNITY): Payer: Self-pay | Admitting: Internal Medicine

## 2019-07-15 DIAGNOSIS — J9601 Acute respiratory failure with hypoxia: Secondary | ICD-10-CM

## 2019-07-15 DIAGNOSIS — U071 COVID-19: Principal | ICD-10-CM

## 2019-07-15 LAB — URINE CULTURE: Culture: NO GROWTH

## 2019-07-15 LAB — CBC WITH DIFFERENTIAL/PLATELET
Abs Immature Granulocytes: 0.01 10*3/uL (ref 0.00–0.07)
Basophils Absolute: 0 10*3/uL (ref 0.0–0.1)
Basophils Relative: 0 %
Eosinophils Absolute: 0 10*3/uL (ref 0.0–0.5)
Eosinophils Relative: 0 %
HCT: 33.5 % — ABNORMAL LOW (ref 36.0–46.0)
Hemoglobin: 10.7 g/dL — ABNORMAL LOW (ref 12.0–15.0)
Immature Granulocytes: 0 %
Lymphocytes Relative: 50 %
Lymphs Abs: 1.3 10*3/uL (ref 0.7–4.0)
MCH: 27.4 pg (ref 26.0–34.0)
MCHC: 31.9 g/dL (ref 30.0–36.0)
MCV: 85.7 fL (ref 80.0–100.0)
Monocytes Absolute: 0.1 10*3/uL (ref 0.1–1.0)
Monocytes Relative: 4 %
Neutro Abs: 1.2 10*3/uL — ABNORMAL LOW (ref 1.7–7.7)
Neutrophils Relative %: 46 %
Platelets: 143 10*3/uL — ABNORMAL LOW (ref 150–400)
RBC: 3.91 MIL/uL (ref 3.87–5.11)
RDW: 14.5 % (ref 11.5–15.5)
WBC: 2.6 10*3/uL — ABNORMAL LOW (ref 4.0–10.5)
nRBC: 0 % (ref 0.0–0.2)

## 2019-07-15 LAB — COMPREHENSIVE METABOLIC PANEL
ALT: 21 U/L (ref 0–44)
AST: 23 U/L (ref 15–41)
Albumin: 2.8 g/dL — ABNORMAL LOW (ref 3.5–5.0)
Alkaline Phosphatase: 37 U/L — ABNORMAL LOW (ref 38–126)
Anion gap: 11 (ref 5–15)
BUN: 11 mg/dL (ref 6–20)
CO2: 21 mmol/L — ABNORMAL LOW (ref 22–32)
Calcium: 8 mg/dL — ABNORMAL LOW (ref 8.9–10.3)
Chloride: 107 mmol/L (ref 98–111)
Creatinine, Ser: 0.92 mg/dL (ref 0.44–1.00)
GFR calc Af Amer: >60 mL/min (ref >60)
GFR calc non Af Amer: >60 mL/min (ref >60)
Glucose, Bld: 124 mg/dL — ABNORMAL HIGH (ref 70–99)
Potassium: 3.7 mmol/L (ref 3.5–5.1)
Sodium: 139 mmol/L (ref 135–145)
Total Bilirubin: 0.6 mg/dL (ref 0.3–1.2)
Total Protein: 6.4 g/dL — ABNORMAL LOW (ref 6.5–8.1)

## 2019-07-15 LAB — PROCALCITONIN: Procalcitonin: 0.14 ng/mL

## 2019-07-15 LAB — C-REACTIVE PROTEIN: CRP: 4.2 mg/dL — ABNORMAL HIGH (ref <1.0)

## 2019-07-15 LAB — D-DIMER, QUANTITATIVE: D-Dimer, Quant: 1.34 ug/mL-FEU — ABNORMAL HIGH (ref 0.00–0.50)

## 2019-07-15 MED ORDER — LORAZEPAM 2 MG/ML IJ SOLN
2.0000 mg | INTRAMUSCULAR | Status: DC | PRN
  Administered 2019-07-16: 2 mg via INTRAVENOUS
  Filled 2019-07-15: qty 1

## 2019-07-15 MED ORDER — DEXAMETHASONE 4 MG PO TABS
4.0000 mg | ORAL_TABLET | ORAL | Status: DC
  Administered 2019-07-16 – 2019-07-18 (×3): 4 mg via ORAL
  Filled 2019-07-15 (×4): qty 1

## 2019-07-15 MED ORDER — SODIUM CHLORIDE 0.9 % IV SOLN
4000.0000 mg | Freq: Once | INTRAVENOUS | Status: AC
  Administered 2019-07-15: 13:00:00 4000 mg via INTRAVENOUS
  Filled 2019-07-15: qty 40

## 2019-07-15 MED ORDER — LEVETIRACETAM 500 MG PO TABS
500.0000 mg | ORAL_TABLET | Freq: Two times a day (BID) | ORAL | Status: DC
  Administered 2019-07-16: 10:00:00 500 mg via ORAL
  Filled 2019-07-15: qty 1

## 2019-07-15 MED ORDER — LORAZEPAM 2 MG/ML IJ SOLN
2.0000 mg | Freq: Once | INTRAMUSCULAR | Status: AC
  Administered 2019-07-15: 2 mg via INTRAVENOUS
  Filled 2019-07-15: qty 1

## 2019-07-15 MED ORDER — SODIUM BICARBONATE 650 MG PO TABS
650.0000 mg | ORAL_TABLET | Freq: Two times a day (BID) | ORAL | Status: AC
  Administered 2019-07-15 (×2): 650 mg via ORAL
  Filled 2019-07-15 (×2): qty 1

## 2019-07-15 NOTE — Consult Note (Addendum)
NEURO HOSPITALIST CONSULT NOTE   Requesting physician: Dr. Thedore MinsSingh   Reason for Consult: Seizure   History obtained from:  Gabriella   HPI:                                                                                                                                          Gabriella Montgomery is an 34 y.o. female with PMH PE ( not on anticoagulation who presented to [facility] with seizure like activity.  For the past 3 days Gabriella has had a cough, fever, chills, fatigue and generalized weak / body aches and vomiting. Of note prior to this mother ( who she lives with) had been sick with similar symptoms. Per Gabriella mother goes to [facility] with her boyfriend where they are tested for covid. Mother was tested negative 1 day before symptoms began two weeks ago. Gabriella does not remember any of the seizure like events. Denies having prior seizures and  Or febrile seizures as a child.  Per chart it was reported that Gabriella fell to ground and had generalized shaking activity. EMS was called and she had another episode and was given versed 5mg  IM.  Attempted to call mother for description of the episode and further history but no answer or return phone call.  Hospital course: 07/13/19: admitted for seizure like activity, COVID-19 positive CTH: negative CT 3/19: MRI: mo focus for seizure activity, no acute abnormality. BP: 133/94 Ucx: no growth Ua: + protein, no UTI CSF: negative   Past Medical History:  Diagnosis Date  . Pulmonary embolism (HCC)     No past surgical history on file.  No family history on file.             Social History:  reports that she has never smoked. She has never used smokeless tobacco. She reports previous alcohol use. She reports that she does not use drugs.  Allergies  Allergen Reactions  . Azithromycin Anaphylaxis  . Amoxicillin Hives    Has Gabriella had a PCN reaction causing immediate rash, facial/tongue/throat swelling, SOB or lightheadedness  with hypotension: No Has Gabriella had a PCN reaction causing severe rash involving mucus membranes or skin necrosis: YEs Has Gabriella had a PCN reaction that required hospitalization: No Has Gabriella had a PCN reaction occurring within the last 10 years: No If all of the above answers are "NO", then may proceed with Cephalosporin use.  . Clindamycin Hives  . Ibuprofen Hives  . Tramadol Hives    MEDICATIONS:  Scheduled: . [START ON 07/16/2019] dexamethasone  4 mg Oral Q24H  . doxycycline  100 mg Oral Q12H  . enoxaparin (LOVENOX) injection  40 mg Subcutaneous Q12H  . sodium bicarbonate  650 mg Oral BID   Continuous: . cefTRIAXone (ROCEPHIN)  IV 2 g (07/14/19 2111)  . remdesivir 100 mg in NS 100 mL 100 mg (07/15/19 0855)   KJZ:PHXTAVWPVXYIA, chlorpheniramine-HYDROcodone, guaiFENesin-dextromethorphan, LORazepam   ROS:                                                                                                                                       ROS was performed and is negative except as noted in HPI   Blood pressure (!) 133/94, pulse (!) 102, temperature 98 F (36.7 C), temperature source Oral, resp. rate 18, height 5\' 5"  (1.651 m), weight 99.8 kg, SpO2 95 %.   General Examination:                                                                                                       Physical Exam  Constitutional: Appears well-developed and well-nourished.  Psych: Affect appropriate to situation Eyes: Normal external eye and conjunctiva. HENT: Normocephalic, no lesions, without obvious abnormality.   Musculoskeletal-no joint tenderness, deformity ,+ right wrist swelling and pain. Cardiovascular: Normal rate and regular rhythm.  Respiratory: Effort normal, non-labored breathing saturations WNL on 2L [place] GI: Soft.  No distension. There is no tenderness.  Skin:  WDI  Neurological Examination Mental Status: Alert, oriented name/age/month/year/ city, thought content appropriate.  Speech fluent without evidence of aphasia.  Able to follow  commands without difficulty. Cranial Nerves: II: Visual fields grossly normal,  III,IV, VI: ptosis not present, extra-ocular motions intact bilaterally pupils equal, round, reactive to light and accommodation V,VII: smile symmetric, facial light touch sensation normal bilaterally VIII: hearing normal bilaterally IX,X: uvula rises symmetrically XI: bilateral shoulder shrug XII: midline tongue extension Motor: Right : Upper extremity   5/5 Left:     Upper extremity   5/5  Lower extremity   5/5  Lower extremity   5/5 Tone and bulk:normal tone throughout; no atrophy noted Sensory: Pinprick and light touch intact throughout, bilaterally Cerebellar: no ataxia with FNF    Lab Results: Basic Metabolic Panel: Recent Labs  Lab 07/13/19 1959 07/13/19 2012 07/14/19 0236 07/15/19 0407  NA 138 139 137 139  K 4.0 4.1 3.9 3.7  CL 107 104 107 107  CO2  --  21* 18* 21*  GLUCOSE 97  123* 100* 124*  BUN 22* 19 16 11   CREATININE 1.40* 1.80* 1.31* 0.92  CALCIUM  --  9.0 7.8* 8.0*    CBC: Recent Labs  Lab 07/13/19 1959 07/13/19 2012 07/14/19 0236 07/15/19 0407  WBC  --  6.6 4.5 2.6*  NEUTROABS  --  3.9  --  1.2*  HGB 16.0* 15.1* 12.2 10.7*  HCT 47.0* 47.3* 39.5 33.5*  MCV  --  84.8 87.2 85.7  PLT  --  207 150 143*   Imaging: CT Head Wo Contrast  Result Date: 07/13/2019 CLINICAL DATA:  Seizure, fall EXAM: CT HEAD WITHOUT CONTRAST CT CERVICAL SPINE WITHOUT CONTRAST TECHNIQUE: Multidetector CT imaging of the head and cervical spine was performed following the standard protocol without intravenous contrast. Multiplanar CT image reconstructions of the cervical spine were also generated. COMPARISON:  None. FINDINGS: CT HEAD FINDINGS Brain: No acute territorial infarction, hemorrhage or intracranial mass. The  ventricles are nonenlarged. Vascular: No hyperdense vessel or unexpected calcification. Skull: Normal. Negative for fracture or focal lesion. Sinuses/Orbits: Mucosal thickening in the sinuses. Other: None CT CERVICAL SPINE FINDINGS Alignment: Reversal of cervical lordosis. No subluxation. Facet alignment is normal Skull base and vertebrae: No acute fracture. No primary bone lesion or focal pathologic process. Soft tissues and spinal canal: No prevertebral fluid or swelling. No visible canal hematoma. Disc levels:  Within normal limits Upper chest: Negative. Other: None IMPRESSION: 1. Negative non contrasted CT appearance of the brain 2. Reversal of cervical lordosis.  No acute osseous abnormality. Electronically Signed   By: 07/15/2019 M.D.   On: 07/13/2019 21:54   CT Angio Chest PE W/Cm &/Or Wo Cm  Result Date: 07/14/2019 CLINICAL DATA:  Left-sided chest and abdominal pain. History of pulmonary embolism. EXAM: CT ANGIOGRAPHY CHEST CT ABDOMEN AND PELVIS WITH CONTRAST TECHNIQUE: Multidetector CT imaging of the chest was performed using the standard protocol during bolus administration of intravenous contrast. Multiplanar CT image reconstructions and MIPs were obtained to evaluate the vascular anatomy. Multidetector CT imaging of the abdomen and pelvis was performed using the standard protocol during bolus administration of intravenous contrast. CONTRAST:  33mL OMNIPAQUE IOHEXOL 350 MG/ML SOLN COMPARISON:  None. FINDINGS: CTA CHEST FINDINGS Cardiovascular: Pulmonary artery hypertension, the pulmonary trunk measuring 33 mm. No central pulmonary embolism. Gabriella respiratory motion precludes evaluation of the more peripheral bilateral pulmonary arteries. No thoracic aorta calcified atherosclerosis or aneurysm. Mediastinum/Nodes: Bulky right hilar and subcarinal lymph nodes which are noncalcified and measure up to 18 mm diameter. Normal thyroid and decompressed esophagus. Lungs/Pleura: Patchy alveolar and airspace  disease throughout the right lung and most confluent in the right middle lobe with air bronchograms. A couple small patchy ground-glass opacities in the left base. No pleural effusion or pneumothorax. A 4 mm pleural based sub solid nodule along the left major fissure, possibly a reactive subpleural lymph node. Musculoskeletal: Normal. Review of the MIP images confirms the above findings. CT ABDOMEN and PELVIS FINDINGS Hepatobiliary: Fatty infiltration of the hepatic parenchyma. Normal gallbladder and biliary tree. Pancreas: Normal. Spleen: Normal. Adrenals/Urinary Tract: Normal. Stomach/Bowel: Normal appendix, axial series 5, image 63. No mucosal thickening or obstruction. Vascular/Lymphatic: A number of small noncalcified lymph nodes at the mesenteric root, likely reactive, measuring up to 17 by 15 mm. No abdominal aortic aneurysm, dissection or calcified atherosclerosis. Patent portal and hepatic veins. 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 Reproductive: No apparent abnormality. Other: No retroperitoneal hematoma, free intraperitoneal fluid or air.  Musculoskeletal: Normal. The Review of the MIP images confirms the above findings. IMPRESSION: No central pulmonary embolism. Respiratory motion artifact precludes exclusion of a peripheral pulmonary embolism. Patchy alveolar and airspace disease in the right lung, most confluent in the right middle lobe, likely pneumonia. A couple small focal alveolar opacities in the left base, also probably infectious. Unenhanced CT chest recommended in 3 months to document resolution. Bulky right hilar and subcarinal lymph nodes, likely reactive. Pulmonary artery hypertension. Hepatic steatosis. A number of small noncalcified lymph nodes at the mesenteric root, likely a reactive adenitis. Electronically Signed   By: Revonda Humphrey   On: 07/14/2019 00:14   CT Cervical Spine Wo Contrast  Result Date: 07/13/2019 CLINICAL DATA:   Seizure, fall EXAM: CT HEAD WITHOUT CONTRAST CT CERVICAL SPINE WITHOUT CONTRAST TECHNIQUE: Multidetector CT imaging of the head and cervical spine was performed following the standard protocol without intravenous contrast. Multiplanar CT image reconstructions of the cervical spine were also generated. COMPARISON:  None. FINDINGS: CT HEAD FINDINGS Brain: No acute territorial infarction, hemorrhage or intracranial mass. The ventricles are nonenlarged. Vascular: No hyperdense vessel or unexpected calcification. Skull: Normal. Negative for fracture or focal lesion. Sinuses/Orbits: Mucosal thickening in the sinuses. Other: None CT CERVICAL SPINE FINDINGS Alignment: Reversal of cervical lordosis. No subluxation. Facet alignment is normal Skull base and vertebrae: No acute fracture. No primary bone lesion or focal pathologic process. Soft tissues and spinal canal: No prevertebral fluid or swelling. No visible canal hematoma. Disc levels:  Within normal limits Upper chest: Negative. Other: None IMPRESSION: 1. Negative non contrasted CT appearance of the brain 2. Reversal of cervical lordosis.  No acute osseous abnormality. Electronically Signed   By: Donavan Foil M.D.   On: 07/13/2019 21:54   MR BRAIN WO CONTRAST  Result Date: 07/14/2019 CLINICAL DATA:  Encephalopathy. Additional history provided: Flu like symptoms, seizure-like activity EXAM: MRI HEAD WITHOUT CONTRAST TECHNIQUE: Multiplanar, multiecho pulse sequences of the brain and surrounding structures were obtained without intravenous contrast. COMPARISON:  CT head 07/13/2019 FINDINGS: Brain: There is no evidence of acute infarct. No evidence of intracranial mass. No midline shift or extra-axial fluid collection. No chronic intracranial blood products. There is a single punctate focus of T2/FLAIR hyperintensity within the right frontal lobe white matter which is nonspecific and of doubtful clinical significance (series 12, image 16). No other focal parenchymal  signal abnormality is identified. The hippocampi are symmetric in size and signal. Cerebral volume is normal for age. Partially empty sella turcica. Mildly low-lying cerebellar tonsils extending 3 mm below the level of the foramen magnum. Vascular: Flow voids maintained within the proximal large arterial vessels. Skull and upper cervical spine: No focal marrow lesion. Sinuses/Orbits: Visualized orbits demonstrate no acute abnormality. Moderate bilateral sphenoid sinus mucosal thickening. Mild mucosal thickening within the remainder of the paranasal sinuses. Small bilateral mastoid effusions. IMPRESSION: 1. No evidence of acute intracranial abnormality. 2. No specific seizure focus is identified. 3. Mildly low-lying cerebellar tonsils extending 3 mm, likely developmental. 4. Paranasal sinus mucosal thickening greatest within the bilateral sphenoid sinuses. 5. Small bilateral mastoid effusions. Electronically Signed   By: Kellie Simmering DO   On: 07/14/2019 10:50   CT Abdomen Pelvis W Contrast  Result Date: 07/14/2019 CLINICAL DATA:  Left-sided chest and abdominal pain. History of pulmonary embolism. EXAM: CT ANGIOGRAPHY CHEST CT ABDOMEN AND PELVIS WITH CONTRAST TECHNIQUE: Multidetector CT imaging of the chest was performed using the standard protocol during bolus administration of intravenous contrast. Multiplanar CT image reconstructions and  MIPs were obtained to evaluate the vascular anatomy. Multidetector CT imaging of the abdomen and pelvis was performed using the standard protocol during bolus administration of intravenous contrast. CONTRAST:  28mL OMNIPAQUE IOHEXOL 350 MG/ML SOLN COMPARISON:  None. FINDINGS: CTA CHEST FINDINGS Cardiovascular: Pulmonary artery hypertension, the pulmonary trunk measuring 33 mm. No central pulmonary embolism. Gabriella respiratory motion precludes evaluation of the more peripheral bilateral pulmonary arteries. No thoracic aorta calcified atherosclerosis or aneurysm.  Mediastinum/Nodes: Bulky right hilar and subcarinal lymph nodes which are noncalcified and measure up to 18 mm diameter. Normal thyroid and decompressed esophagus. Lungs/Pleura: Patchy alveolar and airspace disease throughout the right lung and most confluent in the right middle lobe with air bronchograms. A couple small patchy ground-glass opacities in the left base. No pleural effusion or pneumothorax. A 4 mm pleural based sub solid nodule along the left major fissure, possibly a reactive subpleural lymph node. Musculoskeletal: Normal. Review of the MIP images confirms the above findings. CT ABDOMEN and PELVIS FINDINGS Hepatobiliary: Fatty infiltration of the hepatic parenchyma. Normal gallbladder and biliary tree. Pancreas: Normal. Spleen: Normal. Adrenals/Urinary Tract: Normal. Stomach/Bowel: Normal appendix, axial series 5, image 63. No mucosal thickening or obstruction. Vascular/Lymphatic: A number of small noncalcified lymph nodes at the mesenteric root, likely reactive, measuring up to 17 by 15 mm. No abdominal aortic aneurysm, dissection or calcified atherosclerosis. Patent portal and hepatic veins. 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 Reproductive: No apparent abnormality. Other: No retroperitoneal hematoma, free intraperitoneal fluid or air. Musculoskeletal: Normal. The Review of the MIP images confirms the above findings. IMPRESSION: No central pulmonary embolism. Respiratory motion artifact precludes exclusion of a peripheral pulmonary embolism. Patchy alveolar and airspace disease in the right lung, most confluent in the right middle lobe, likely pneumonia. A couple small focal alveolar opacities in the left base, also probably infectious. Unenhanced CT chest recommended in 3 months to document resolution. Bulky right hilar and subcarinal lymph nodes, likely reactive. Pulmonary artery hypertension. Hepatic steatosis. A number of small noncalcified  lymph nodes at the mesenteric root, likely a reactive adenitis. Electronically Signed   By: Laurence Ferrari   On: 07/14/2019 00:14   DG Chest Port 1 View  Result Date: 07/13/2019 CLINICAL DATA:  Fever.  Altered mental status. EXAM: PORTABLE CHEST 1 VIEW COMPARISON:  None. FINDINGS: Cardiac silhouette normal in size. No mediastinal or hilar masses or evidence of adenopathy. Mild linear/interstitial type opacities right lower lung, which may reflect atelectasis. Lungs otherwise clear. No pleural effusion or pneumothorax. Skeletal structures are intact. IMPRESSION: 1. Mild area linear/interstitial opacity at the right lung base, most likely atelectasis and crowding due to low lung volumes and Gabriella rotation. A small focus pneumonia is possible. No other abnormality. Electronically Signed   By: Amie Portland M.D.   On: 07/13/2019 19:34   EEG adult  Result Date: 07/14/2019 Charlsie Quest, MD     07/14/2019  4:11 PM Gabriella Montgomery MRN: [id number] Epilepsy Attending: Charlsie Quest Referring Physician/Provider: Dr. Susa Raring Date: 07/14/2019 Duration: 25.03 mins Gabriella history: 34 year old female, found to be Covid positive also had 2 episodes of seizure-like activity.  EEG to evaluate for seizures. Level of alertness: Awake, asleep AEDs during EEG study: None Technical aspects: This EEG study was done with scalp electrodes positioned according to the 10-20 International system of electrode placement. Electrical activity was acquired at a sampling rate of 500Hz  and reviewed with a high frequency filter  of  and a low frequency filter of . EEG data were recorded continuously and digitally stored. Description: The posterior dominant rhythm consists of 9 Hz activity of moderate voltage (25-35 uV) seen predominantly in posterior head regions, symmetric and reactive to eye opening and eye closing.  Sleep was characterized by vertex waves, sleep spindles (12 to 14 Hz), maximal frontocentral.   Hyperventilation and photic stimulation were not performed. IMPRESSION: This study is within normal limits. No seizures or epileptiform discharges were seen throughout the recording. Charlsie Quest    Assessment: 34 year old female with PMH PE.  Presented to [facility] with seizure like activity. Gabriella has been sick with fever/chills/nausea/fatigue for the past 3 days. COVID-19 positive. EEG : was negative for seizures or epileptiform discharges. MRI was negative for anything acute or seizure focus. LP was negative. She was given a 4 g one time dose of Keppra. Given that Gabriella has had 2 seizure events, we will start her on keppra 500 mg BID.   Recommendations: -seizure precuations -- Keppra 500 mg BID    Valentina Lucks, MSN, NP-C [facility] [phone]  Attending neurologist's note to follow   07/15/2019, 1:35 PM I have seen the Gabriella reviewed the above note.  She is awake, alert, slightly drowsy from Ativan, but otherwise relatively nonfocal.  Her CSF from the other day is consistent with a bloody tap, I suspect that tubes 1 and 4 were reversed, but the only reason to have as big a difference between tubes is traumatic contamination.  At this time, I have very low suspicion for primary CNS infection.  What I do suspect is that she likely has a seizure predisposition which was unmasked with her generalized inflammatory state in the setting of Covid.  She will need continued AED therapy, at least in the intermediate term.   Ritta Slot, MD [facility] [phone]  If 7pm- 7am, please page neurology on call as listed in AMION.

## 2019-07-15 NOTE — Progress Notes (Addendum)
PROGRESS NOTE                                                                                                                                                                                                             Patient Demographics:    Gabriella Montgomery, is a 34 y.o. female, DOB - Apr 01, 1986, NOB:[id number]  Outpatient Primary MD for the patient is Patient, No Pcp Per    LOS - 1  Admit date - 07/13/2019    CC - Fever     Brief Narrative - Gabriella Montgomery is a 34 y.o. female with medical history significant of PE (looks like occurred while pregnant, no longer on anticoagulation).  Patient apparently was having cough high fevers and some body aches for the last few days, on the day of hospital visit she had 2 episodes of seizure-like activity after which she was admitted to the hospital, note 1 episode happened in the ER.  Was found to have COVID-19 pneumonia, her LP was negative, CT head, CT C-spine, MRI brain were nonacute.     Subjective:   Patient in bed, appears comfortable, denies any headache, no fever, no chest pain or pressure, no shortness of breath , no abdominal pain. No focal weakness.   Assessment  & Plan :     1.  Acute Covid 19 Viral Pneumonitis during the ongoing 2020 Covid 19 Pandemic - she has moderate disease has been started on steroids and remdesivir.  Will monitor inflammatory markers, advance activity and monitor closely clinically.  Encouraged the patient to sit up in chair in the daytime use I-S and flutter valve for pulmonary toiletry and then prone in bed when at night.   SpO2: 96 % O2 Flow Rate (L/min): 2 L/min  Recent Labs  Lab 07/13/19 2133 07/14/19 0557 07/15/19 0407  CRP  --  3.8* 4.2*  DDIMER  --  2.11* 1.34*  PROCALCITON  --  0.21 0.14  SARSCOV2NAA POSITIVE*  --   --     Hepatic Function Latest Ref Rng & Units 07/15/2019 07/14/2019 07/13/2019  Total Protein 6.5 - 8.1 g/dL  6.4(L) 6.4(L) 8.5(H)  Albumin 3.5 - 5.0 g/dL 2.8(L) 3.0(L) 3.8  AST 15 - 41 U/L 23 31 35  ALT 0 - 44 U/L 21 23 27   Alk Phosphatase 38 - 126 U/L 37(L) 37(L)  52  Total Bilirubin 0.3 - 1.2 mg/dL 0.6 0.6 0.6    2.  Possible seizure-like activity.  Brain imaging including CT and MRI nonacute, patient headache free, LP reveals CSF which is consistent with a bloody tap but no infection, will ordestabler EEG.  Unclear if she had a true seizure-like episode or not, case discussed with neurologist Dr. Leonel Ramsay, patient will get 6 months driving probation upon discharge.  If all other work-up negative no medications at this time.  3.  Fever related dehydration and AKI and metabolic acidosis.  Improving with IV fluids, continue hydration.  4.  Possible community-acquired pneumonia with interstitial infiltrates and some mention of focal infiltrates on CT.  Will continue Rocephin-doxycycline combination and monitor.  5.  Mild viral infection induced leukopenia.  Monitor.   Serum hCG ordered, patient states she is not sexually active.    Condition - Fair  Family Communication  :  Mother - [phone] on 07/14/19, updated again on 07/15/2019  Code Status :  Full  Diet :   Diet Order            Diet regular Room service appropriate? Yes; Fluid consistency: Thin  Diet effective now               Disposition Plan  : Stay in the hospital for treatment of COVID-19 pneumonia  Consults  : None  Procedures  :    CTA -  No central pulmonary embolism. Respiratory motion artifact precludes exclusion of a peripheral pulmonary embolism. Patchy alveolar and airspace disease in the right lung, most confluent in the right middle lobe, likely pneumonia. A couple small focal alveolar opacities in the left base, also probably infectious. Unenhanced CT chest recommended in 3 months to document resolution. Bulky right hilar and subcarinal lymph nodes, likely reactive. Pulmonary artery hypertension. Hepatic  steatosis. A number of small noncalcified lymph nodes at the mesenteric root, likely a reactive adenitis.  MRI Brain - Non acute  CT C spine - Non acute  EEG.  No epileptiform or seizure focus.  PUD Prophylaxis : None  DVT Prophylaxis  :  Lovenox    Lab Results  Component Value Date   PLT 143 (L) 07/15/2019    Inpatient Medications  Scheduled Meds: . [START ON 07/16/2019] dexamethasone  4 mg Oral Q24H  . doxycycline  100 mg Oral Q12H  . enoxaparin (LOVENOX) injection  40 mg Subcutaneous Q12H  . sodium bicarbonate  650 mg Oral BID   Continuous Infusions: . cefTRIAXone (ROCEPHIN)  IV 2 g (07/14/19 2111)  . lactated ringers Stopped (07/15/19 0850)  . remdesivir 100 mg in NS 100 mL 100 mg (07/15/19 0855)   PRN Meds:.acetaminophen, chlorpheniramine-HYDROcodone, guaiFENesin-dextromethorphan  Antibiotics  :    Anti-infectives (From admission, onward)   Start     Dose/Rate Route Frequency Ordered Stop   07/15/19 1000  remdesivir 100 mg in sodium chloride 0.9 % 100 mL IVPB  Status:  Discontinued     100 mg 200 mL/hr over 30 Minutes Intravenous Daily 07/14/19 0511 07/14/19 0514   07/15/19 1000  remdesivir 100 mg in sodium chloride 0.9 % 100 mL IVPB     100 mg 200 mL/hr over 30 Minutes Intravenous Daily 07/14/19 0515 07/19/19 0959   07/14/19 0600  remdesivir 100 mg in sodium chloride 0.9 % 100 mL IVPB     100 mg 200 mL/hr over 30 Minutes Intravenous Every 1 hr x 2 07/14/19 0515 07/14/19 0746   07/14/19 0515  remdesivir  200 mg in sodium chloride 0.9% 250 mL IVPB  Status:  Discontinued     200 mg 580 mL/hr over 30 Minutes Intravenous Once 07/14/19 0511 07/14/19 0514   07/14/19 0200  levofloxacin (LEVAQUIN) IVPB 750 mg  Status:  Discontinued     750 mg 100 mL/hr over 90 Minutes Intravenous Every 24 hours 07/14/19 0124 07/14/19 0127   07/14/19 0130  cefTRIAXone (ROCEPHIN) 2 g in sodium chloride 0.9 % 100 mL IVPB     2 g 200 mL/hr over 30 Minutes Intravenous Daily at bedtime  07/14/19 0124     07/14/19 0115  cefTRIAXone (ROCEPHIN) 1 g in sodium chloride 0.9 % 100 mL IVPB  Status:  Discontinued     1 g 200 mL/hr over 30 Minutes Intravenous  Once 07/14/19 0105 07/14/19 0114   07/14/19 0115  doxycycline (VIBRA-TABS) tablet 100 mg  Status:  Discontinued     100 mg Oral  Once 07/14/19 0105 07/14/19 0114   07/14/19 0115  cefTRIAXone (ROCEPHIN) 2 g in sodium chloride 0.9 % 100 mL IVPB  Status:  Discontinued     2 g 200 mL/hr over 30 Minutes Intravenous Every 24 hours 07/14/19 0114 07/14/19 0118   07/14/19 0115  doxycycline (VIBRA-TABS) tablet 100 mg     100 mg Oral Every 12 hours 07/14/19 0114         Time Spent in minutes  30   Lala Lund M.D on 07/15/2019 at 11:16 AM  To page go to [email or web address] - password [facility]  [facility] -  Office  [phone]    See all Orders from today for further details    Objective:   Vitals:   07/14/19 2350 07/15/19 0400 07/15/19 0806 07/15/19 0845  BP: (!) 147/99 (!) 141/87 (!) 124/108 123/68  Pulse: 96 87 91   Resp:  20 18   Temp: 98.6 F (37 C) 98.1 F (36.7 C) 98 F (36.7 C)   TempSrc: Oral Oral Oral   SpO2: 98% 92% 96%   Weight:      Height:        Wt Readings from Last 3 Encounters:  07/14/19 99.8 kg  06/30/18 99.8 kg     Intake/Output Summary (Last 24 hours) at 07/15/2019 1116 Last data filed at 07/15/2019 1100 Gross per 24 hour  Intake 1752.01 ml  Output --  Net 1752.01 ml     Physical Exam  Awake Alert, No new F.N deficits, Normal affect .AT,PERRAL Supple Neck,No JVD, No cervical lymphadenopathy appriciated.  Symmetrical Chest wall movement, Good air movement bilaterally, CTAB RRR,No Gallops, Rubs or new Murmurs, No Parasternal Heave +ve B.Sounds, Abd Soft, No tenderness, No organomegaly appriciated, No rebound - guarding or rigidity. No Cyanosis, Clubbing or edema, No new Rash or bruise    Data Review:    CBC Recent Labs  Lab 07/13/19 1959 07/13/19 2012 07/14/19 0236  07/15/19 0407  WBC  --  6.6 4.5 2.6*  HGB 16.0* 15.1* 12.2 10.7*  HCT 47.0* 47.3* 39.5 33.5*  PLT  --  207 150 143*  MCV  --  84.8 87.2 85.7  MCH  --  27.1 26.9 27.4  MCHC  --  31.9 30.9 31.9  RDW  --  14.5 14.6 14.5  LYMPHSABS  --  2.6  --  1.3  MONOABS  --  0.1  --  0.1  EOSABS  --  0.0  --  0.0  BASOSABS  --  0.0  --  0.0    Chemistries  Recent Labs  Lab 07/13/19 1959 07/13/19 2012 07/14/19 0236 07/15/19 0407  NA 138 139 137 139  K 4.0 4.1 3.9 3.7  CL 107 104 107 107  CO2  --  21* 18* 21*  GLUCOSE 97 123* 100* 124*  BUN 22* 19 16 11   CREATININE 1.40* 1.80* 1.31* 0.92  CALCIUM  --  9.0 7.8* 8.0*  AST  --  35 31 23  ALT  --  27 23 21   ALKPHOS  --  52 37* 37*  BILITOT  --  0.6 0.6 0.6  INR  --  1.0  --   --      ------------------------------------------------------------------------------------------------------------------ No results for input(s): CHOL, HDL, LDLCALC, TRIG, CHOLHDL, LDLDIRECT in the last 72 hours.  No results found for: HGBA1C ------------------------------------------------------------------------------------------------------------------ No results for input(s): TSH, T4TOTAL, T3FREE, THYROIDAB in the last 72 hours.  Invalid input(s): FREET3  Cardiac Enzymes No results for input(s): CKMB, TROPONINI, MYOGLOBIN in the last 168 hours.  Invalid input(s): CK ------------------------------------------------------------------------------------------------------------------ No results found for: BNP  Micro Results Recent Results (from the past 240 hour(s))  Blood Culture (routine x 2)     Status: None (Preliminary result)   Collection Time: 07/13/19  8:11 PM   Specimen: BLOOD  Result Value Ref Range Status   Specimen Description BLOOD BLOOD RIGHT FOREARM  Final   Special Requests   Final    BOTTLES DRAWN AEROBIC AND ANAEROBIC Blood Culture adequate volume   Culture   Final    NO GROWTH 2 DAYS Performed at [facility], [place] [address] [place], [place] [address]    Report Status PENDING  Incomplete  Blood Culture (routine x 2)     Status: None (Preliminary result)   Collection Time: 07/13/19  9:07 PM   Specimen: BLOOD LEFT HAND  Result Value Ref Range Status   Specimen Description BLOOD LEFT HAND  Final   Special Requests   Final    BOTTLES DRAWN AEROBIC AND ANAEROBIC Blood Culture results may not be optimal due to an inadequate volume of blood received in culture bottles   Culture   Final    NO GROWTH 2 DAYS Performed at [facility], [place] [address] [place], [place] [address]    Report Status PENDING  Incomplete  SARS CORONAVIRUS 2 (TAT 6-24 HRS) Nasopharyngeal Nasopharyngeal Swab     Status: Abnormal   Collection Time: 07/13/19  9:33 PM   Specimen: Nasopharyngeal Swab  Result Value Ref Range Status   SARS Coronavirus 2 POSITIVE (A) NEGATIVE Final    Comment: RESULT CALLED TO, READ BACK BY AND VERIFIED WITH: RN MEGAN RUGGIERO AT 0500 BY [place] ON 07/14/2019 (NOTE) SARS-CoV-2 target nucleic acids are DETECTED. The SARS-CoV-2 RNA is generally detectable in upper and lower respiratory specimens during the acute phase of infection. Positive results are indicative of the presence of SARS-CoV-2 RNA. Clinical correlation with patient history and other diagnostic information is  necessary to determine patient infection status. Positive results do not rule out bacterial infection or co-infection with other viruses.  The expected result is Negative. Fact Sheet for Patients: SugarRoll.be Fact Sheet for Healthcare Providers: https://www.woods-mathews.com/ This test is not yet approved or cleared by the [place] FDA and  has been authorized for detection and/or diagnosis of SARS-CoV-2 by FDA under an Emergency Use Authorization (EUA). This EUA will remain  in effect (meaning this t est can be used) for the duration of the COVID-19 declaration under Section  564(b)(1) of  the Act, 21 U.S.C. section 360bbb-3(b)(1), unless the authorization is terminated or revoked sooner. Performed at [facility], [place] [address], [place], [place] [address]   Urine culture     Status: None   Collection Time: 07/13/19 11:08 PM   Specimen: In/Out Cath Urine  Result Value Ref Range Status   Specimen Description IN/OUT CATH URINE  Final   Special Requests NONE  Final   Culture   Final    NO GROWTH Performed at [facility], [place] [address], [place], [place] [address]    Report Status 07/15/2019 FINAL  Final  CSF culture     Status: None (Preliminary result)   Collection Time: 07/14/19  1:08 AM   Specimen: Back; Cerebrospinal Fluid  Result Value Ref Range Status   Specimen Description BACK  Final   Special Requests NONE  Final   Gram Stain   Final    CYTOSPIN SMEAR WBC PRESENT, PREDOMINANTLY MONONUCLEAR NO ORGANISMS SEEN    Culture   Final    NO GROWTH 1 DAY Performed at [facility], [place] [address] [place], [place] [address]    Report Status PENDING  Incomplete    Radiology Reports CT Head Wo Contrast  Result Date: 07/13/2019 CLINICAL DATA:  Seizure, fall EXAM: CT HEAD WITHOUT CONTRAST CT CERVICAL SPINE WITHOUT CONTRAST TECHNIQUE: Multidetector CT imaging of the head and cervical spine was performed following the standard protocol without intravenous contrast. Multiplanar CT image reconstructions of the cervical spine were also generated. COMPARISON:  None. FINDINGS: CT HEAD FINDINGS Brain: No acute territorial infarction, hemorrhage or intracranial mass. The ventricles are nonenlarged. Vascular: No hyperdense vessel or unexpected calcification. Skull: Normal. Negative for fracture or focal lesion. Sinuses/Orbits: Mucosal thickening in the sinuses. Other: None CT CERVICAL SPINE FINDINGS Alignment: Reversal of cervical lordosis. No subluxation. Facet alignment is normal Skull base and vertebrae: No acute fracture. No primary bone lesion  or focal pathologic process. Soft tissues and spinal canal: No prevertebral fluid or swelling. No visible canal hematoma. Disc levels:  Within normal limits Upper chest: Negative. Other: None IMPRESSION: 1. Negative non contrasted CT appearance of the brain 2. Reversal of cervical lordosis.  No acute osseous abnormality. Electronically Signed   By: Donavan Foil M.D.   On: 07/13/2019 21:54   CT Angio Chest PE W/Cm &/Or Wo Cm  Result Date: 07/14/2019 CLINICAL DATA:  Left-sided chest and abdominal pain. History of pulmonary embolism. EXAM: CT ANGIOGRAPHY CHEST CT ABDOMEN AND PELVIS WITH CONTRAST TECHNIQUE: Multidetector CT imaging of the chest was performed using the standard protocol during bolus administration of intravenous contrast. Multiplanar CT image reconstructions and MIPs were obtained to evaluate the vascular anatomy. Multidetector CT imaging of the abdomen and pelvis was performed using the standard protocol during bolus administration of intravenous contrast. CONTRAST:  27m OMNIPAQUE IOHEXOL 350 MG/ML SOLN COMPARISON:  None. FINDINGS: CTA CHEST FINDINGS Cardiovascular: Pulmonary artery hypertension, the pulmonary trunk measuring 33 mm. No central pulmonary embolism. Patient respiratory motion precludes evaluation of the more peripheral bilateral pulmonary arteries. No thoracic aorta calcified atherosclerosis or aneurysm. Mediastinum/Nodes: Bulky right hilar and subcarinal lymph nodes which are noncalcified and measure up to 18 mm diameter. Normal thyroid and decompressed esophagus. Lungs/Pleura: Patchy alveolar and airspace disease throughout the right lung and most confluent in the right middle lobe with air bronchograms. A couple small patchy ground-glass opacities in the left base. No pleural effusion or pneumothorax. A 4 mm pleural based sub solid nodule along the  left major fissure, possibly a reactive subpleural lymph node. Musculoskeletal: Normal. Review of the MIP images confirms the above  findings. CT ABDOMEN and PELVIS FINDINGS Hepatobiliary: Fatty infiltration of the hepatic parenchyma. Normal gallbladder and biliary tree. Pancreas: Normal. Spleen: Normal. Adrenals/Urinary Tract: Normal. Stomach/Bowel: Normal appendix, axial series 5, image 63. No mucosal thickening or obstruction. Vascular/Lymphatic: A number of small noncalcified lymph nodes at the mesenteric root, likely reactive, measuring up to 17 by 15 mm. No abdominal aortic aneurysm, dissection or calcified atherosclerosis. Patent portal and hepatic veins. 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 Reproductive: No apparent abnormality. Other: No retroperitoneal hematoma, free intraperitoneal fluid or air. Musculoskeletal: Normal. The Review of the MIP images confirms the above findings. IMPRESSION: No central pulmonary embolism. Respiratory motion artifact precludes exclusion of a peripheral pulmonary embolism. Patchy alveolar and airspace disease in the right lung, most confluent in the right middle lobe, likely pneumonia. A couple small focal alveolar opacities in the left base, also probably infectious. Unenhanced CT chest recommended in 3 months to document resolution. Bulky right hilar and subcarinal lymph nodes, likely reactive. Pulmonary artery hypertension. Hepatic steatosis. A number of small noncalcified lymph nodes at the mesenteric root, likely a reactive adenitis. Electronically Signed   By: Revonda Humphrey   On: 07/14/2019 00:14   CT Cervical Spine Wo Contrast  Result Date: 07/13/2019 CLINICAL DATA:  Seizure, fall EXAM: CT HEAD WITHOUT CONTRAST CT CERVICAL SPINE WITHOUT CONTRAST TECHNIQUE: Multidetector CT imaging of the head and cervical spine was performed following the standard protocol without intravenous contrast. Multiplanar CT image reconstructions of the cervical spine were also generated. COMPARISON:  None. FINDINGS: CT HEAD FINDINGS Brain: No acute territorial  infarction, hemorrhage or intracranial mass. The ventricles are nonenlarged. Vascular: No hyperdense vessel or unexpected calcification. Skull: Normal. Negative for fracture or focal lesion. Sinuses/Orbits: Mucosal thickening in the sinuses. Other: None CT CERVICAL SPINE FINDINGS Alignment: Reversal of cervical lordosis. No subluxation. Facet alignment is normal Skull base and vertebrae: No acute fracture. No primary bone lesion or focal pathologic process. Soft tissues and spinal canal: No prevertebral fluid or swelling. No visible canal hematoma. Disc levels:  Within normal limits Upper chest: Negative. Other: None IMPRESSION: 1. Negative non contrasted CT appearance of the brain 2. Reversal of cervical lordosis.  No acute osseous abnormality. Electronically Signed   By: Donavan Foil M.D.   On: 07/13/2019 21:54   MR BRAIN WO CONTRAST  Result Date: 07/14/2019 CLINICAL DATA:  Encephalopathy. Additional history provided: Flu like symptoms, seizure-like activity EXAM: MRI HEAD WITHOUT CONTRAST TECHNIQUE: Multiplanar, multiecho pulse sequences of the brain and surrounding structures were obtained without intravenous contrast. COMPARISON:  CT head 07/13/2019 FINDINGS: Brain: There is no evidence of acute infarct. No evidence of intracranial mass. No midline shift or extra-axial fluid collection. No chronic intracranial blood products. There is a single punctate focus of T2/FLAIR hyperintensity within the right frontal lobe white matter which is nonspecific and of doubtful clinical significance (series 12, image 16). No other focal parenchymal signal abnormality is identified. The hippocampi are symmetric in size and signal. Cerebral volume is normal for age. Partially empty sella turcica. Mildly low-lying cerebellar tonsils extending 3 mm below the level of the foramen magnum. Vascular: Flow voids maintained within the proximal large arterial vessels. Skull and upper cervical spine: No focal marrow lesion.  Sinuses/Orbits: Visualized orbits demonstrate no acute abnormality. Moderate bilateral sphenoid sinus mucosal thickening. Mild mucosal thickening within the  remainder of the paranasal sinuses. Small bilateral mastoid effusions. IMPRESSION: 1. No evidence of acute intracranial abnormality. 2. No specific seizure focus is identified. 3. Mildly low-lying cerebellar tonsils extending 3 mm, likely developmental. 4. Paranasal sinus mucosal thickening greatest within the bilateral sphenoid sinuses. 5. Small bilateral mastoid effusions. Electronically Signed   By: Kellie Simmering DO   On: 07/14/2019 10:50   CT Abdomen Pelvis W Contrast  Result Date: 07/14/2019 CLINICAL DATA:  Left-sided chest and abdominal pain. History of pulmonary embolism. EXAM: CT ANGIOGRAPHY CHEST CT ABDOMEN AND PELVIS WITH CONTRAST TECHNIQUE: Multidetector CT imaging of the chest was performed using the standard protocol during bolus administration of intravenous contrast. Multiplanar CT image reconstructions and MIPs were obtained to evaluate the vascular anatomy. Multidetector CT imaging of the abdomen and pelvis was performed using the standard protocol during bolus administration of intravenous contrast. CONTRAST:  42m OMNIPAQUE IOHEXOL 350 MG/ML SOLN COMPARISON:  None. FINDINGS: CTA CHEST FINDINGS Cardiovascular: Pulmonary artery hypertension, the pulmonary trunk measuring 33 mm. No central pulmonary embolism. Patient respiratory motion precludes evaluation of the more peripheral bilateral pulmonary arteries. No thoracic aorta calcified atherosclerosis or aneurysm. Mediastinum/Nodes: Bulky right hilar and subcarinal lymph nodes which are noncalcified and measure up to 18 mm diameter. Normal thyroid and decompressed esophagus. Lungs/Pleura: Patchy alveolar and airspace disease throughout the right lung and most confluent in the right middle lobe with air bronchograms. A couple small patchy ground-glass opacities in the left base. No pleural  effusion or pneumothorax. A 4 mm pleural based sub solid nodule along the left major fissure, possibly a reactive subpleural lymph node. Musculoskeletal: Normal. Review of the MIP images confirms the above findings. CT ABDOMEN and PELVIS FINDINGS Hepatobiliary: Fatty infiltration of the hepatic parenchyma. Normal gallbladder and biliary tree. Pancreas: Normal. Spleen: Normal. Adrenals/Urinary Tract: Normal. Stomach/Bowel: Normal appendix, axial series 5, image 63. No mucosal thickening or obstruction. Vascular/Lymphatic: A number of small noncalcified lymph nodes at the mesenteric root, likely reactive, measuring up to 17 by 15 mm. No abdominal aortic aneurysm, dissection or calcified atherosclerosis. Patent portal and hepatic veins. 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 Reproductive: No apparent abnormality. Other: No retroperitoneal hematoma, free intraperitoneal fluid or air. Musculoskeletal: Normal. The Review of the MIP images confirms the above findings. IMPRESSION: No central pulmonary embolism. Respiratory motion artifact precludes exclusion of a peripheral pulmonary embolism. Patchy alveolar and airspace disease in the right lung, most confluent in the right middle lobe, likely pneumonia. A couple small focal alveolar opacities in the left base, also probably infectious. Unenhanced CT chest recommended in 3 months to document resolution. Bulky right hilar and subcarinal lymph nodes, likely reactive. Pulmonary artery hypertension. Hepatic steatosis. A number of small noncalcified lymph nodes at the mesenteric root, likely a reactive adenitis. Electronically Signed   By: RRevonda Humphrey  On: 07/14/2019 00:14   DG Chest Port 1 View  Result Date: 07/13/2019 CLINICAL DATA:  Fever.  Altered mental status. EXAM: PORTABLE CHEST 1 VIEW COMPARISON:  None. FINDINGS: Cardiac silhouette normal in size. No mediastinal or hilar masses or evidence of adenopathy. Mild  linear/interstitial type opacities right lower lung, which may reflect atelectasis. Lungs otherwise clear. No pleural effusion or pneumothorax. Skeletal structures are intact. IMPRESSION: 1. Mild area linear/interstitial opacity at the right lung base, most likely atelectasis and crowding due to low lung volumes and patient rotation. A small focus pneumonia is possible. No other abnormality. Electronically Signed  By: Lajean Manes M.D.   On: 07/13/2019 19:34   EEG adult  Result Date: 07/14/2019 Lora Havens, MD     07/14/2019  4:11 PM Patient Name: LATEEFA CROSBY MRN: [id number] Epilepsy Attending: Lora Havens Referring Physician/Provider: Dr. Lala Lund Date: 07/14/2019 Duration: 25.03 mins Patient history: 34 year old female, found to be Covid positive also had 2 episodes of seizure-like activity.  EEG to evaluate for seizures. Level of alertness: Awake, asleep AEDs during EEG study: None Technical aspects: This EEG study was done with scalp electrodes positioned according to the 10-20 International system of electrode placement. Electrical activity was acquired at a sampling rate of 500Hz  and reviewed with a high frequency filter of 70Hz  and a low frequency filter of 1Hz . EEG data were recorded continuously and digitally stored. Description: The posterior dominant rhythm consists of 9 Hz activity of moderate voltage (25-35 uV) seen predominantly in posterior head regions, symmetric and reactive to eye opening and eye closing.  Sleep was characterized by vertex waves, sleep spindles (12 to 14 Hz), maximal frontocentral.  Hyperventilation and photic stimulation were not performed. IMPRESSION: This study is within normal limits. No seizures or epileptiform discharges were seen throughout the recording. Priyanka Barbra Sarks

## 2019-07-15 NOTE — Plan of Care (Signed)

## 2019-07-15 NOTE — Discharge Instructions (Signed)
Do not drive, operate heavy machinery, perform activities at heights, swimming or participation in water activities or provide baby sitting services until you have seen by Primary MD or a Neurologist and advised to do so again.  Follow with Primary MD  in 7 days   Get CBC, CMP, 2 view Chest X ray -  checked next visit within 1 week by Primary MD  Activity: As tolerated with Full fall precautions use walker/cane & assistance as needed  Disposition Home   Diet: Heart Healthy   Special Instructions: If you have smoked or chewed Tobacco  in the last 2 yrs please stop smoking, stop any regular Alcohol  and or any Recreational drug use.  On your next visit with your primary care physician please Get Medicines reviewed and adjusted.  Please request your Prim.MD to go over all Hospital Tests and Procedure/Radiological results at the follow up, please get all Hospital records sent to your Prim MD by signing hospital release before you go home.  If you experience worsening of your admission symptoms, develop shortness of breath, life threatening emergency, suicidal or homicidal thoughts you must seek medical attention immediately by calling 911 or calling your MD immediately  if symptoms less severe.  You Must read complete instructions/literature along with all the possible adverse reactions/side effects for all the Medicines you take and that have been prescribed to you. Take any new Medicines after you have completely understood and accpet all the possible adverse reactions/side effects.      Person Under Monitoring Name: Gabriella Montgomery  Location: [place] [address]   Infection Prevention Recommendations for Individuals Confirmed to have, or Being Evaluated for, 2019 Novel Coronavirus (COVID-19) Infection Who Receive Care at Home  Individuals who are confirmed to have, or are being evaluated for, COVID-19 should follow the prevention steps below until a healthcare  provider or local or state health department says they can return to normal activities.  Stay home except to get medical care You should restrict activities outside your home, except for getting medical care. Do not go to work, school, or public areas, and do not use public transportation or taxis.  Call ahead before visiting your doctor Before your medical appointment, call the healthcare provider and tell them that you have, or are being evaluated for, COVID-19 infection. This will help the healthcare provider's office take steps to keep other people from getting infected. Ask your healthcare provider to call the local or state health department.  Monitor your symptoms Seek prompt medical attention if your illness is worsening (e.g., difficulty breathing). Before going to your medical appointment, call the healthcare provider and tell them that you have, or are being evaluated for, COVID-19 infection. Ask your healthcare provider to call the local or state health department.  Wear a facemask You should wear a facemask that covers your nose and mouth when you are in the same room with other people and when you visit a healthcare provider. People who live with or visit you should also wear a facemask while they are in the same room with you.  Separate yourself from other people in your home As much as possible, you should stay in a different room from other people in your home. Also, you should use a separate bathroom, if available.  Avoid sharing household items You should not share dishes, drinking glasses, cups, eating utensils, towels, bedding, or other items with other people in your home. After using these items, you  should wash them thoroughly with soap and water.  Cover your coughs and sneezes Cover your mouth and nose with a tissue when you cough or sneeze, or you can cough or sneeze into your sleeve. Throw used tissues in a lined trash can, and immediately wash your hands  with soap and water for at least 20 seconds or use an alcohol-based hand rub.  Wash your [employer] your hands often and thoroughly with soap and water for at least 20 seconds. You can use an alcohol-based hand sanitizer if soap and water are not available and if your hands are not visibly dirty. Avoid touching your eyes, nose, and mouth with unwashed hands.   Prevention Steps for Caregivers and Household Members of Individuals Confirmed to have, or Being Evaluated for, COVID-19 Infection Being Cared for in the Home  If you live with, or provide care at home for, a person confirmed to have, or being evaluated for, COVID-19 infection please follow these guidelines to prevent infection:  Follow healthcare provider's instructions Make sure that you understand and can help the patient follow any healthcare provider instructions for all care.  Provide for the patient's basic needs You should help the patient with basic needs in the home and provide support for getting groceries, prescriptions, and other personal needs.  Monitor the patient's symptoms If they are getting sicker, call his or her medical provider and tell them that the patient has, or is being evaluated for, COVID-19 infection. This will help the healthcare provider's office take steps to keep other people from getting infected. Ask the healthcare provider to call the local or state health department.  Limit the number of people who have contact with the patient  If possible, have only one caregiver for the patient.  Other household members should stay in another home or place of residence. If this is not possible, they should stay  in another room, or be separated from the patient as much as possible. Use a separate bathroom, if available.  Restrict visitors who do not have an essential need to be in the home.  Keep older adults, very young children, and other sick people away from the patient Keep older adults, very  young children, and those who have compromised immune systems or chronic health conditions away from the patient. This includes people with chronic heart, lung, or kidney conditions, diabetes, and cancer.  Ensure good ventilation Make sure that shared spaces in the home have good air flow, such as from an air conditioner or an opened window, weather permitting.  Wash your hands often  Wash your hands often and thoroughly with soap and water for at least 20 seconds. You can use an alcohol based hand sanitizer if soap and water are not available and if your hands are not visibly dirty.  Avoid touching your eyes, nose, and mouth with unwashed hands.  Use disposable paper towels to dry your hands. If not available, use dedicated cloth towels and replace them when they become wet.  Wear a facemask and gloves  Wear a disposable facemask at all times in the room and gloves when you touch or have contact with the patient's blood, body fluids, and/or secretions or excretions, such as sweat, saliva, sputum, nasal mucus, vomit, urine, or feces.  Ensure the mask fits over your nose and mouth tightly, and do not touch it during use.  Throw out disposable facemasks and gloves after using them. Do not reuse.  Wash your hands immediately after removing your  facemask and gloves.  If your personal clothing becomes contaminated, carefully remove clothing and launder. Wash your hands after handling contaminated clothing.  Place all used disposable facemasks, gloves, and other waste in a lined container before disposing them with other household waste.  Remove gloves and wash your hands immediately after handling these items.  Do not share dishes, glasses, or other household items with the patient  Avoid sharing household items. You should not share dishes, drinking glasses, cups, eating utensils, towels, bedding, or other items with a patient who is confirmed to have, or being evaluated for, COVID-19  infection.  After the person uses these items, you should wash them thoroughly with soap and water.  Wash laundry thoroughly  Immediately remove and wash clothes or bedding that have blood, body fluids, and/or secretions or excretions, such as sweat, saliva, sputum, nasal mucus, vomit, urine, or feces, on them.  Wear gloves when handling laundry from the patient.  Read and follow directions on labels of laundry or clothing items and detergent. In general, wash and dry with the warmest temperatures recommended on the label.  Clean all areas the individual has used often  Clean all touchable surfaces, such as counters, tabletops, doorknobs, bathroom fixtures, toilets, phones, keyboards, tablets, and bedside tables, every day. Also, clean any surfaces that may have blood, body fluids, and/or secretions or excretions on them.  Wear gloves when cleaning surfaces the patient has come in contact with.  Use a diluted bleach solution (e.g., dilute bleach with 1 part bleach and 10 parts water) or a household disinfectant with a label that says EPA-registered for coronaviruses. To make a bleach solution at home, add 1 tablespoon of bleach to 1 quart (4 cups) of water. For a larger supply, add  cup of bleach to 1 gallon (16 cups) of water.  Read labels of cleaning products and follow recommendations provided on product labels. Labels contain instructions for safe and effective use of the cleaning product including precautions you should take when applying the product, such as wearing gloves or eye protection and making sure you have good ventilation during use of the product.  Remove gloves and wash hands immediately after cleaning.  Monitor yourself for signs and symptoms of illness Caregivers and household members are considered close contacts, should monitor their health, and will be asked to limit movement outside of the home to the extent possible. Follow the monitoring steps for close contacts  listed on the symptom monitoring form.   ? If you have additional questions, contact your local health department or call the epidemiologist on call at [phone] (available 24/7). ? This guidance is subject to change. For the most up-to-date guidance from [facility], please refer to their website: [email or web address]

## 2019-07-15 NOTE — Significant Event (Signed)
Care RN called to pt room to assist pt to bathroom. Pt stand by - 1xassist to the bathroom without difficulty. Care RN remained in the room while pt in bathroom and was alerted to the patient voicing concern about experiencing a seizure on the toilet. Pt bilateral UE began to shake uncontrollably with Care RN at side. Patient voiced that her vision had become blurry, she felt lightheaded and she could not move her bilateral LE. Care RN performed Neuro assessment on LE, pt followed commands weakly with a 5 second delay, shaking of UE continued. Care RN sent call to floor for assistance d/t patient position on the toilet and concerns about safety returning to bed. Seizure continued for 5 minutes, shaking lessened, and tech brought Stedy toiletside and patient transported back to bed with a heavy 2xassist. Patient continued to voice dizziness and blurred vision, never experienced LOC and was always able to converse with a processing delay. Patient assisted to a lying position in bed when shaking returned and patient exhibited a fixed vision stare, tachypnic, yet responsive to verbal stimuli. Dr. Thedore Mins paged now that Care RN felt patient was safe - 2mg  of Ativan ordered and given, Charge RN now at bedside. Full neuro assessment conducted with some RLE weakness noted - seizure like shaking of UE ceased with Ativan administration. Keppra bolus ordered and delivered, 3L of O2 administered and pt placed back on pulse oximetry and telemetry. Patient noted lessening of symptoms and felt her "vision was returning." Dr. updated on patient stability and patient allowed to rest.

## 2019-07-16 LAB — CBC WITH DIFFERENTIAL/PLATELET
Abs Immature Granulocytes: 0.03 10*3/uL (ref 0.00–0.07)
Basophils Absolute: 0 10*3/uL (ref 0.0–0.1)
Basophils Relative: 0 %
Eosinophils Absolute: 0 10*3/uL (ref 0.0–0.5)
Eosinophils Relative: 0 %
HCT: 34.2 % — ABNORMAL LOW (ref 36.0–46.0)
Hemoglobin: 11 g/dL — ABNORMAL LOW (ref 12.0–15.0)
Immature Granulocytes: 1 %
Lymphocytes Relative: 42 %
Lymphs Abs: 2 10*3/uL (ref 0.7–4.0)
MCH: 27.5 pg (ref 26.0–34.0)
MCHC: 32.2 g/dL (ref 30.0–36.0)
MCV: 85.5 fL (ref 80.0–100.0)
Monocytes Absolute: 0.2 10*3/uL (ref 0.1–1.0)
Monocytes Relative: 4 %
Neutro Abs: 2.5 10*3/uL (ref 1.7–7.7)
Neutrophils Relative %: 53 %
Platelets: 176 10*3/uL (ref 150–400)
RBC: 4 MIL/uL (ref 3.87–5.11)
RDW: 14.4 % (ref 11.5–15.5)
WBC: 4.7 10*3/uL (ref 4.0–10.5)
nRBC: 0 % (ref 0.0–0.2)

## 2019-07-16 LAB — C-REACTIVE PROTEIN: CRP: 1.4 mg/dL — ABNORMAL HIGH (ref <1.0)

## 2019-07-16 LAB — COMPREHENSIVE METABOLIC PANEL
ALT: 19 U/L (ref 0–44)
AST: 17 U/L (ref 15–41)
Albumin: 2.7 g/dL — ABNORMAL LOW (ref 3.5–5.0)
Alkaline Phosphatase: 33 U/L — ABNORMAL LOW (ref 38–126)
Anion gap: 12 (ref 5–15)
BUN: 11 mg/dL (ref 6–20)
CO2: 22 mmol/L (ref 22–32)
Calcium: 8.1 mg/dL — ABNORMAL LOW (ref 8.9–10.3)
Chloride: 109 mmol/L (ref 98–111)
Creatinine, Ser: 0.82 mg/dL (ref 0.44–1.00)
GFR calc Af Amer: >60 mL/min (ref >60)
GFR calc non Af Amer: >60 mL/min (ref >60)
Glucose, Bld: 111 mg/dL — ABNORMAL HIGH (ref 70–99)
Potassium: 3.8 mmol/L (ref 3.5–5.1)
Sodium: 143 mmol/L (ref 135–145)
Total Bilirubin: 0.6 mg/dL (ref 0.3–1.2)
Total Protein: 6 g/dL — ABNORMAL LOW (ref 6.5–8.1)

## 2019-07-16 LAB — D-DIMER, QUANTITATIVE: D-Dimer, Quant: 1.14 ug/mL-FEU — ABNORMAL HIGH (ref 0.00–0.50)

## 2019-07-16 LAB — PROCALCITONIN: Procalcitonin: <0.1 ng/mL

## 2019-07-16 MED ORDER — LEVETIRACETAM 750 MG PO TABS
750.0000 mg | ORAL_TABLET | Freq: Two times a day (BID) | ORAL | Status: DC
  Administered 2019-07-16 – 2019-07-19 (×6): 750 mg via ORAL
  Filled 2019-07-16 (×6): qty 1

## 2019-07-16 MED ORDER — ONDANSETRON HCL 4 MG/2ML IJ SOLN
4.0000 mg | Freq: Four times a day (QID) | INTRAMUSCULAR | Status: DC | PRN
  Administered 2019-07-16 – 2019-07-17 (×2): 4 mg via INTRAVENOUS
  Filled 2019-07-16 (×2): qty 2

## 2019-07-16 MED ORDER — INFLUENZA VAC SPLIT QUAD 0.5 ML IM SUSY
0.5000 mL | PREFILLED_SYRINGE | INTRAMUSCULAR | Status: AC
  Administered 2019-07-19: 11:00:00 0.5 mL via INTRAMUSCULAR
  Filled 2019-07-16: qty 0.5

## 2019-07-16 MED ORDER — LOPERAMIDE HCL 2 MG PO CAPS: 2.0000 mg | ORAL_CAPSULE | Freq: Four times a day (QID) | ORAL | Status: DC | PRN

## 2019-07-16 NOTE — Progress Notes (Signed)
PROGRESS NOTE                                                                                                                                                                                                             Patient Demographics:    Gabriella Montgomery, is a 34 y.o. female, DOB - 1986/03/17, FBP:[id number]  Outpatient Primary MD for the patient is Patient, No Pcp Per    LOS - 2  Admit date - 07/13/2019    CC - Fever     Brief Narrative - Gabriella Montgomery is a 34 y.o. female with medical history significant of PE (looks like occurred while pregnant, no longer on anticoagulation).  Patient apparently was having cough high fevers and some body aches for the last few days, on the day of hospital visit she had 2 episodes of seizure-like activity after which she was admitted to the hospital, note 1 episode happened in the ER.  Was found to have COVID-19 pneumonia, her LP was negative, CT head, CT C-spine, MRI brain were nonacute.     Subjective:   Patient in bed, appears comfortable, denies any headache, no fever, no chest pain or pressure, no shortness of breath , no abdominal pain. No focal weakness.   Assessment  & Plan :     1.  Acute Covid 19 Viral Pneumonitis during the ongoing 2020 Covid 19 Pandemic - she has moderate disease has been started on steroids and remdesivir.  Will monitor inflammatory markers, advance activity and monitor closely clinically.  Encouraged the patient to sit up in chair in the daytime use I-S and flutter valve for pulmonary toiletry and then prone in bed when at night.   Recent Labs  Lab 07/13/19 2133 07/14/19 0557 07/15/19 0407 07/16/19 0412  CRP  --  3.8* 4.2* 1.4*  DDIMER  --  2.11* 1.34* 1.14*  PROCALCITON  --  0.21 0.14 <0.10  SARSCOV2NAA POSITIVE*  --   --   --     Hepatic Function Latest Ref Rng & Units 07/16/2019 07/15/2019 07/14/2019  Total Protein 6.5 - 8.1 g/dL 6.0(L)  6.4(L) 6.4(L)  Albumin 3.5 - 5.0 g/dL 2.7(L) 2.8(L) 3.0(L)  AST 15 - 41 U/L 17 23 31   ALT 0 - 44 U/L 19 21 23   Alk Phosphatase 38 - 126 U/L 33(L) 37(L) 37(L)  Total Bilirubin 0.3 - 1.2 mg/dL 0.6 0.6 0.6    2.  Possible seizure-like activity.  Brain imaging including CT and MRI nonacute, EEG stable, patient headache free, LP reveals CSF which is consistent with a bloody tap but no infection, initially was stable however had another witnessed seizure on 07/15/2019 after which neurology saw the patient and has started her on Depakote.  Currently stable.  Will go home on Depakote and driving restrictions with outpatient neurology follow-up.  3.  Fever related dehydration and AKI and metabolic acidosis.  Improving with IV fluids, continue hydration.  4.  Possible community-acquired pneumonia with interstitial infiltrates and some mention of focal infiltrates on CT.  Will continue Rocephin-doxycycline combination and monitor.  5.  Mild viral infection induced leukopenia.  Monitor.   Serum hCG ordered, patient states she is not sexually active.    Condition - Fair  Family Communication  :  Mother - [phone] on 07/14/19, updated again on 07/15/2019, 07/16/19  Code Status :  Full  Diet :   Diet Order            Diet regular Room service appropriate? Yes; Fluid consistency: Thin  Diet effective now               Disposition Plan  : Stay in the hospital for treatment of COVID-19 pneumonia, seizure new onset treatment  Consults  : Neuro  Procedures  :    CTA -  No central pulmonary embolism. Respiratory motion artifact precludes exclusion of a peripheral pulmonary embolism. Patchy alveolar and airspace disease in the right lung, most confluent in the right middle lobe, likely pneumonia. A couple small focal alveolar opacities in the left base, also probably infectious. Unenhanced CT chest recommended in 3 months to document resolution. Bulky right hilar and subcarinal lymph nodes,  likely reactive. Pulmonary artery hypertension. Hepatic steatosis. A number of small noncalcified lymph nodes at the mesenteric root, likely a reactive adenitis.  MRI Brain - Non acute  CT C spine - Non acute  EEG.  No epileptiform or seizure focus.  PUD Prophylaxis : None  DVT Prophylaxis  :  Lovenox    Lab Results  Component Value Date   PLT 176 07/16/2019    Inpatient Medications  Scheduled Meds:  dexamethasone  4 mg Oral Q24H   doxycycline  100 mg Oral Q12H   enoxaparin (LOVENOX) injection  40 mg Subcutaneous Q12H   [START ON 07/17/2019] influenza vac split quadrivalent PF  0.5 mL Intramuscular Tomorrow-1000   levETIRAcetam  500 mg Oral BID   Continuous Infusions:  cefTRIAXone (ROCEPHIN)  IV 2 g (07/15/19 2226)   remdesivir 100 mg in NS 100 mL 100 mg (07/16/19 0955)   PRN Meds:.acetaminophen, chlorpheniramine-HYDROcodone, guaiFENesin-dextromethorphan, LORazepam, ondansetron (ZOFRAN) IV  Antibiotics  :    Anti-infectives (From admission, onward)   Start     Dose/Rate Route Frequency Ordered Stop   07/15/19 1000  remdesivir 100 mg in sodium chloride 0.9 % 100 mL IVPB  Status:  Discontinued     100 mg 200 mL/hr over 30 Minutes Intravenous Daily 07/14/19 0511 07/14/19 0514   07/15/19 1000  remdesivir 100 mg in sodium chloride 0.9 % 100 mL IVPB     100 mg 200 mL/hr over 30 Minutes Intravenous Daily 07/14/19 0515 07/19/19 0959   07/14/19 0600  remdesivir 100 mg in sodium chloride 0.9 % 100 mL IVPB     100 mg 200 mL/hr over 30 Minutes Intravenous Every 1 hr x 2  07/14/19 0515 07/14/19 0746   07/14/19 0515  remdesivir 200 mg in sodium chloride 0.9% 250 mL IVPB  Status:  Discontinued     200 mg 580 mL/hr over 30 Minutes Intravenous Once 07/14/19 0511 07/14/19 0514   07/14/19 0200  levofloxacin (LEVAQUIN) IVPB 750 mg  Status:  Discontinued     750 mg 100 mL/hr over 90 Minutes Intravenous Every 24 hours 07/14/19 0124 07/14/19 0127   07/14/19 0130  cefTRIAXone  (ROCEPHIN) 2 g in sodium chloride 0.9 % 100 mL IVPB     2 g 200 mL/hr over 30 Minutes Intravenous Daily at bedtime 07/14/19 0124     07/14/19 0115  cefTRIAXone (ROCEPHIN) 1 g in sodium chloride 0.9 % 100 mL IVPB  Status:  Discontinued     1 g 200 mL/hr over 30 Minutes Intravenous  Once 07/14/19 0105 07/14/19 0114   07/14/19 0115  doxycycline (VIBRA-TABS) tablet 100 mg  Status:  Discontinued     100 mg Oral  Once 07/14/19 0105 07/14/19 0114   07/14/19 0115  cefTRIAXone (ROCEPHIN) 2 g in sodium chloride 0.9 % 100 mL IVPB  Status:  Discontinued     2 g 200 mL/hr over 30 Minutes Intravenous Every 24 hours 07/14/19 0114 07/14/19 0118   07/14/19 0115  doxycycline (VIBRA-TABS) tablet 100 mg     100 mg Oral Every 12 hours 07/14/19 0114         Time Spent in minutes  30   Lala Lund M.D on 07/16/2019 at 10:51 AM  To page go to [email or web address] - password [facility]  [facility] -  Office  [phone]    See all Orders from today for further details    Objective:   Vitals:   07/15/19 2338 07/16/19 0458 07/16/19 0459 07/16/19 0841  BP:  110/65    Pulse:  77 75   Resp:  (!) 24 (!) 25   Temp: 98.4 F (36.9 C) 98.3 F (36.8 C)  98.3 F (36.8 C)  TempSrc: Oral Oral  Oral  SpO2:   100%   Weight:      Height:        Wt Readings from Last 3 Encounters:  07/14/19 99.8 kg  06/30/18 99.8 kg     Intake/Output Summary (Last 24 hours) at 07/16/2019 1051 Last data filed at 07/15/2019 2335 Gross per 24 hour  Intake 360 ml  Output 450 ml  Net -90 ml     Physical Exam  Awake Alert, No new F.N deficits, Normal affect [place].AT,PERRAL Supple Neck,No JVD, No cervical lymphadenopathy appriciated.  Symmetrical Chest wall movement, Good air movement bilaterally, CTAB RRR,No Gallops, Rubs or new Murmurs, No Parasternal Heave +ve B.Sounds, Abd Soft, No tenderness, No organomegaly appriciated, No rebound - guarding or rigidity. No Cyanosis, Clubbing or edema, No new Rash or bruise     Data Review:    CBC Recent Labs  Lab 07/13/19 1959 07/13/19 2012 07/14/19 0236 07/15/19 0407 07/16/19 0412  WBC  --  6.6 4.5 2.6* 4.7  HGB 16.0* 15.1* 12.2 10.7* 11.0*  HCT 47.0* 47.3* 39.5 33.5* 34.2*  PLT  --  207 150 143* 176  MCV  --  84.8 87.2 85.7 85.5  MCH  --  27.1 26.9 27.4 27.5  MCHC  --  31.9 30.9 31.9 32.2  RDW  --  14.5 14.6 14.5 14.4  LYMPHSABS  --  2.6  --  1.3 2.0  MONOABS  --  0.1  --  0.1 0.2  EOSABS  --  0.0  --  0.0 0.0  BASOSABS  --  0.0  --  0.0 0.0    Chemistries  Recent Labs  Lab 07/13/19 1959 07/13/19 2012 07/14/19 0236 07/15/19 0407 07/16/19 0412  NA 138 139 137 139 143  K 4.0 4.1 3.9 3.7 3.8  CL 107 104 107 107 109  CO2  --  21* 18* 21* 22  GLUCOSE 97 123* 100* 124* 111*  BUN 22* 19 16 11 11   CREATININE 1.40* 1.80* 1.31* 0.92 0.82  CALCIUM  --  9.0 7.8* 8.0* 8.1*  AST  --  35 31 23 17   ALT  --  27 23 21 19   ALKPHOS  --  52 37* 37* 33*  BILITOT  --  0.6 0.6 0.6 0.6  INR  --  1.0  --   --   --      ------------------------------------------------------------------------------------------------------------------ No results for input(s): CHOL, HDL, LDLCALC, TRIG, CHOLHDL, LDLDIRECT in the last 72 hours.  No results found for: HGBA1C ------------------------------------------------------------------------------------------------------------------ No results for input(s): TSH, T4TOTAL, T3FREE, THYROIDAB in the last 72 hours.  Invalid input(s): FREET3  Cardiac Enzymes No results for input(s): CKMB, TROPONINI, MYOGLOBIN in the last 168 hours.  Invalid input(s): CK ------------------------------------------------------------------------------------------------------------------ No results found for: BNP  Micro Results Recent Results (from the past 240 hour(s))  Blood Culture (routine x 2)     Status: None (Preliminary result)   Collection Time: 07/13/19  8:11 PM   Specimen: BLOOD  Result Value Ref Range Status   Specimen  Description BLOOD BLOOD RIGHT FOREARM  Final   Special Requests   Final    BOTTLES DRAWN AEROBIC AND ANAEROBIC Blood Culture adequate volume   Culture   Final    NO GROWTH 3 DAYS Performed at [facility], [place] [address] [place], [place] [address]    Report Status PENDING  Incomplete  Blood Culture (routine x 2)     Status: None (Preliminary result)   Collection Time: 07/13/19  9:07 PM   Specimen: BLOOD LEFT HAND  Result Value Ref Range Status   Specimen Description BLOOD LEFT HAND  Final   Special Requests   Final    BOTTLES DRAWN AEROBIC AND ANAEROBIC Blood Culture results may not be optimal due to an inadequate volume of blood received in culture bottles   Culture   Final    NO GROWTH 3 DAYS Performed at [facility], [place] [address] [place], [place] [address]    Report Status PENDING  Incomplete  SARS CORONAVIRUS 2 (TAT 6-24 HRS) Nasopharyngeal Nasopharyngeal Swab     Status: Abnormal   Collection Time: 07/13/19  9:33 PM   Specimen: Nasopharyngeal Swab  Result Value Ref Range Status   SARS Coronavirus 2 POSITIVE (A) NEGATIVE Final    Comment: RESULT CALLED TO, READ BACK BY AND VERIFIED WITH: RN MEGAN RUGGIERO AT 0500 BY [employer] ON 07/14/2019 (NOTE) SARS-CoV-2 target nucleic acids are DETECTED. The SARS-CoV-2 RNA is generally detectable in upper and lower respiratory specimens during the acute phase of infection. Positive results are indicative of the presence of SARS-CoV-2 RNA. Clinical correlation with patient history and other diagnostic information is  necessary to determine patient infection status. Positive results do not rule out bacterial infection or co-infection with other viruses.  The expected result is Negative. Fact Sheet for Patients: SugarRoll.be Fact Sheet for Healthcare Providers: https://www.woods-mathews.com/ This test is not yet approved or cleared by the [place] and  has been  authorized  for detection and/or diagnosis of SARS-CoV-2 by FDA under an Emergency Use Authorization (EUA). This EUA will remain  in effect (meaning this t est can be used) for the duration of the COVID-19 declaration under Section 564(b)(1) of the Act, 21 U.S.C. section 360bbb-3(b)(1), unless the authorization is terminated or revoked sooner. Performed at [facility], [place] [address] [place], [place] [address]   Urine culture     Status: None   Collection Time: 07/13/19 11:08 PM   Specimen: In/Out Cath Urine  Result Value Ref Range Status   Specimen Description IN/OUT CATH URINE  Final   Special Requests NONE  Final   Culture   Final    NO GROWTH Performed at [facility], [place] [address], [place], [place] [address]    Report Status 07/15/2019 FINAL  Final  CSF culture     Status: None (Preliminary result)   Collection Time: 07/14/19  1:08 AM   Specimen: Back; Cerebrospinal Fluid  Result Value Ref Range Status   Specimen Description BACK  Final   Special Requests NONE  Final   Gram Stain   Final    CYTOSPIN SMEAR WBC PRESENT, PREDOMINANTLY MONONUCLEAR NO ORGANISMS SEEN    Culture   Final    NO GROWTH 2 DAYS Performed at [facility], [place] [address] [place], [place] [address]    Report Status PENDING  Incomplete    Radiology Reports CT Head Wo Contrast  Result Date: 07/13/2019 CLINICAL DATA:  Seizure, fall EXAM: CT HEAD WITHOUT CONTRAST CT CERVICAL SPINE WITHOUT CONTRAST TECHNIQUE: Multidetector CT imaging of the head and cervical spine was performed following the standard protocol without intravenous contrast. Multiplanar CT image reconstructions of the cervical spine were also generated. COMPARISON:  None. FINDINGS: CT HEAD FINDINGS Brain: No acute territorial infarction, hemorrhage or intracranial mass. The ventricles are nonenlarged. Vascular: No hyperdense vessel or unexpected calcification. Skull: Normal. Negative for fracture or focal lesion.  Sinuses/Orbits: Mucosal thickening in the sinuses. Other: None CT CERVICAL SPINE FINDINGS Alignment: Reversal of cervical lordosis. No subluxation. Facet alignment is normal Skull base and vertebrae: No acute fracture. No primary bone lesion or focal pathologic process. Soft tissues and spinal canal: No prevertebral fluid or swelling. No visible canal hematoma. Disc levels:  Within normal limits Upper chest: Negative. Other: None IMPRESSION: 1. Negative non contrasted CT appearance of the brain 2. Reversal of cervical lordosis.  No acute osseous abnormality. Electronically Signed   By: Donavan Foil M.D.   On: 07/13/2019 21:54   CT Angio Chest PE W/Cm &/Or Wo Cm  Result Date: 07/14/2019 CLINICAL DATA:  Left-sided chest and abdominal pain. History of pulmonary embolism. EXAM: CT ANGIOGRAPHY CHEST CT ABDOMEN AND PELVIS WITH CONTRAST TECHNIQUE: Multidetector CT imaging of the chest was performed using the standard protocol during bolus administration of intravenous contrast. Multiplanar CT image reconstructions and MIPs were obtained to evaluate the vascular anatomy. Multidetector CT imaging of the abdomen and pelvis was performed using the standard protocol during bolus administration of intravenous contrast. CONTRAST:  26m OMNIPAQUE IOHEXOL 350 MG/ML SOLN COMPARISON:  None. FINDINGS: CTA CHEST FINDINGS Cardiovascular: Pulmonary artery hypertension, the pulmonary trunk measuring 33 mm. No central pulmonary embolism. Patient respiratory motion precludes evaluation of the more peripheral bilateral pulmonary arteries. No thoracic aorta calcified atherosclerosis or aneurysm. Mediastinum/Nodes: Bulky right hilar and subcarinal lymph nodes which are noncalcified and measure up to 18 mm diameter. Normal thyroid and decompressed esophagus. Lungs/Pleura: Patchy alveolar and airspace disease throughout  the right lung and most confluent in the right middle lobe with air bronchograms. A couple small patchy ground-glass  opacities in the left base. No pleural effusion or pneumothorax. A 4 mm pleural based sub solid nodule along the left major fissure, possibly a reactive subpleural lymph node. Musculoskeletal: Normal. Review of the MIP images confirms the above findings. CT ABDOMEN and PELVIS FINDINGS Hepatobiliary: Fatty infiltration of the hepatic parenchyma. Normal gallbladder and biliary tree. Pancreas: Normal. Spleen: Normal. Adrenals/Urinary Tract: Normal. Stomach/Bowel: Normal appendix, axial series 5, image 63. No mucosal thickening or obstruction. Vascular/Lymphatic: A number of small noncalcified lymph nodes at the mesenteric root, likely reactive, measuring up to 17 by 15 mm. No abdominal aortic aneurysm, dissection or calcified atherosclerosis. Patent portal and hepatic veins. 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 Reproductive: No apparent abnormality. Other: No retroperitoneal hematoma, free intraperitoneal fluid or air. Musculoskeletal: Normal. The Review of the MIP images confirms the above findings. IMPRESSION: No central pulmonary embolism. Respiratory motion artifact precludes exclusion of a peripheral pulmonary embolism. Patchy alveolar and airspace disease in the right lung, most confluent in the right middle lobe, likely pneumonia. A couple small focal alveolar opacities in the left base, also probably infectious. Unenhanced CT chest recommended in 3 months to document resolution. Bulky right hilar and subcarinal lymph nodes, likely reactive. Pulmonary artery hypertension. Hepatic steatosis. A number of small noncalcified lymph nodes at the mesenteric root, likely a reactive adenitis. Electronically Signed   By: Revonda Humphrey   On: 07/14/2019 00:14   CT Cervical Spine Wo Contrast  Result Date: 07/13/2019 CLINICAL DATA:  Seizure, fall EXAM: CT HEAD WITHOUT CONTRAST CT CERVICAL SPINE WITHOUT CONTRAST TECHNIQUE: Multidetector CT imaging of the head and  cervical spine was performed following the standard protocol without intravenous contrast. Multiplanar CT image reconstructions of the cervical spine were also generated. COMPARISON:  None. FINDINGS: CT HEAD FINDINGS Brain: No acute territorial infarction, hemorrhage or intracranial mass. The ventricles are nonenlarged. Vascular: No hyperdense vessel or unexpected calcification. Skull: Normal. Negative for fracture or focal lesion. Sinuses/Orbits: Mucosal thickening in the sinuses. Other: None CT CERVICAL SPINE FINDINGS Alignment: Reversal of cervical lordosis. No subluxation. Facet alignment is normal Skull base and vertebrae: No acute fracture. No primary bone lesion or focal pathologic process. Soft tissues and spinal canal: No prevertebral fluid or swelling. No visible canal hematoma. Disc levels:  Within normal limits Upper chest: Negative. Other: None IMPRESSION: 1. Negative non contrasted CT appearance of the brain 2. Reversal of cervical lordosis.  No acute osseous abnormality. Electronically Signed   By: Donavan Foil M.D.   On: 07/13/2019 21:54   MR BRAIN WO CONTRAST  Result Date: 07/14/2019 CLINICAL DATA:  Encephalopathy. Additional history provided: Flu like symptoms, seizure-like activity EXAM: MRI HEAD WITHOUT CONTRAST TECHNIQUE: Multiplanar, multiecho pulse sequences of the brain and surrounding structures were obtained without intravenous contrast. COMPARISON:  CT head 07/13/2019 FINDINGS: Brain: There is no evidence of acute infarct. No evidence of intracranial mass. No midline shift or extra-axial fluid collection. No chronic intracranial blood products. There is a single punctate focus of T2/FLAIR hyperintensity within the right frontal lobe white matter which is nonspecific and of doubtful clinical significance (series 12, image 16). No other focal parenchymal signal abnormality is identified. The hippocampi are symmetric in size and signal. Cerebral volume is normal for age. Partially empty  sella turcica. Mildly low-lying cerebellar tonsils extending 3 mm below the level of the  foramen magnum. Vascular: Flow voids maintained within the proximal large arterial vessels. Skull and upper cervical spine: No focal marrow lesion. Sinuses/Orbits: Visualized orbits demonstrate no acute abnormality. Moderate bilateral sphenoid sinus mucosal thickening. Mild mucosal thickening within the remainder of the paranasal sinuses. Small bilateral mastoid effusions. IMPRESSION: 1. No evidence of acute intracranial abnormality. 2. No specific seizure focus is identified. 3. Mildly low-lying cerebellar tonsils extending 3 mm, likely developmental. 4. Paranasal sinus mucosal thickening greatest within the bilateral sphenoid sinuses. 5. Small bilateral mastoid effusions. Electronically Signed   By: Kellie Simmering DO   On: 07/14/2019 10:50   CT Abdomen Pelvis W Contrast  Result Date: 07/14/2019 CLINICAL DATA:  Left-sided chest and abdominal pain. History of pulmonary embolism. EXAM: CT ANGIOGRAPHY CHEST CT ABDOMEN AND PELVIS WITH CONTRAST TECHNIQUE: Multidetector CT imaging of the chest was performed using the standard protocol during bolus administration of intravenous contrast. Multiplanar CT image reconstructions and MIPs were obtained to evaluate the vascular anatomy. Multidetector CT imaging of the abdomen and pelvis was performed using the standard protocol during bolus administration of intravenous contrast. CONTRAST:  30m OMNIPAQUE IOHEXOL 350 MG/ML SOLN COMPARISON:  None. FINDINGS: CTA CHEST FINDINGS Cardiovascular: Pulmonary artery hypertension, the pulmonary trunk measuring 33 mm. No central pulmonary embolism. Patient respiratory motion precludes evaluation of the more peripheral bilateral pulmonary arteries. No thoracic aorta calcified atherosclerosis or aneurysm. Mediastinum/Nodes: Bulky right hilar and subcarinal lymph nodes which are noncalcified and measure up to 18 mm diameter. Normal thyroid and  decompressed esophagus. Lungs/Pleura: Patchy alveolar and airspace disease throughout the right lung and most confluent in the right middle lobe with air bronchograms. A couple small patchy ground-glass opacities in the left base. No pleural effusion or pneumothorax. A 4 mm pleural based sub solid nodule along the left major fissure, possibly a reactive subpleural lymph node. Musculoskeletal: Normal. Review of the MIP images confirms the above findings. CT ABDOMEN and PELVIS FINDINGS Hepatobiliary: Fatty infiltration of the hepatic parenchyma. Normal gallbladder and biliary tree. Pancreas: Normal. Spleen: Normal. Adrenals/Urinary Tract: Normal. Stomach/Bowel: Normal appendix, axial series 5, image 63. No mucosal thickening or obstruction. Vascular/Lymphatic: A number of small noncalcified lymph nodes at the mesenteric root, likely reactive, measuring up to 17 by 15 mm. No abdominal aortic aneurysm, dissection or calcified atherosclerosis. Patent portal and hepatic veins. 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 Reproductive: No apparent abnormality. Other: No retroperitoneal hematoma, free intraperitoneal fluid or air. Musculoskeletal: Normal. The Review of the MIP images confirms the above findings. IMPRESSION: No central pulmonary embolism. Respiratory motion artifact precludes exclusion of a peripheral pulmonary embolism. Patchy alveolar and airspace disease in the right lung, most confluent in the right middle lobe, likely pneumonia. A couple small focal alveolar opacities in the left base, also probably infectious. Unenhanced CT chest recommended in 3 months to document resolution. Bulky right hilar and subcarinal lymph nodes, likely reactive. Pulmonary artery hypertension. Hepatic steatosis. A number of small noncalcified lymph nodes at the mesenteric root, likely a reactive adenitis. Electronically Signed   By: RRevonda Humphrey  On: 07/14/2019 00:14   DG  Chest Port 1 View  Result Date: 07/13/2019 CLINICAL DATA:  Fever.  Altered mental status. EXAM: PORTABLE CHEST 1 VIEW COMPARISON:  None. FINDINGS: Cardiac silhouette normal in size. No mediastinal or hilar masses or evidence of adenopathy. Mild linear/interstitial type opacities right lower lung, which may reflect atelectasis. Lungs otherwise clear. No pleural effusion or pneumothorax. Skeletal structures  are intact. IMPRESSION: 1. Mild area linear/interstitial opacity at the right lung base, most likely atelectasis and crowding due to low lung volumes and patient rotation. A small focus pneumonia is possible. No other abnormality. Electronically Signed   By: Lajean Manes M.D.   On: 07/13/2019 19:34   EEG adult  Result Date: 07/14/2019 Lora Havens, MD     07/14/2019  4:11 PM Patient Name: QUANIYAH BUGH MRN: [id number] Epilepsy Attending: Lora Havens Referring Physician/Provider: Dr. Lala Lund Date: 07/14/2019 Duration: 25.03 mins Patient history: 34 year old female, found to be Covid positive also had 2 episodes of seizure-like activity.  EEG to evaluate for seizures. Level of alertness: Awake, asleep AEDs during EEG study: None Technical aspects: This EEG study was done with scalp electrodes positioned according to the 10-20 International system of electrode placement. Electrical activity was acquired at a sampling rate of 500Hz  and reviewed with a high frequency filter of 70Hz  and a low frequency filter of 1Hz . EEG data were recorded continuously and digitally stored. Description: The posterior dominant rhythm consists of 9 Hz activity of moderate voltage (25-35 uV) seen predominantly in posterior head regions, symmetric and reactive to eye opening and eye closing.  Sleep was characterized by vertex waves, sleep spindles (12 to 14 Hz), maximal frontocentral.  Hyperventilation and photic stimulation were not performed. IMPRESSION: This study is within normal limits. No seizures or  epileptiform discharges were seen throughout the recording. Priyanka Barbra Sarks

## 2019-07-16 NOTE — Progress Notes (Signed)
1151 patient up to [facility] and had an episode of inc while trying to get to [facility]. Patient with loose stools. Patient completed bathroom trip and wash up. Proceeded to sit in the chair and use her IS then started with a slight tremor in the right hand, followed by moderate tremor of the right hand and arm. Patient never lost conscious and spoke to nurse the entire time, slow to respond but speech remained clear. Patient complained of headache 8/10 and nausea.Pateint continues with a very mild shaking of the hand after 30 mins.

## 2019-07-16 NOTE — Plan of Care (Signed)
  Problem: Education: Goal: Knowledge of General Education information will improve Description: Including pain rating scale, medication(s)/side effects and non-pharmacologic comfort measures Outcome: Progressing   Problem: Clinical Measurements: Goal: Will remain free from infection Outcome: Progressing   

## 2019-07-16 NOTE — Plan of Care (Signed)
Plan of care initiated.

## 2019-07-16 NOTE — Progress Notes (Addendum)
NEURO HOSPITALIST PROGRESS NOTE   Subjective: Patient asleep, but easily arouses to light touch. NAD. States she feels better than yesterday.  Exam: Vitals:   07/16/19 0459 07/16/19 0841  BP:    Pulse: 75   Resp: (!) 25   Temp:  98.3 F (36.8 C)  SpO2: 100%     Physical Exam  Constitutional: Appears well-developed and well-nourished.  Psych: Affect appropriate to situation Eyes: Normal external eye and conjunctiva. HENT: Normocephalic, no lesions, without obvious abnormality.   Musculoskeletal-no joint tenderness, deformity or swelling Cardiovascular: Normal rate and regular rhythm.  Respiratory: Effort normal, non-labored breathing saturations WNL on RA GI: Soft.  No distension. There is no tenderness.  Skin: WDI   Neuro:  Mental Status: Alert, oriented, thought content appropriate.  Speech fluent without evidence of aphasia.  Able to follow  commands without difficulty. Cranial Nerves: II:  Visual fields grossly normal,  III,IV, VI: ptosis not present, extra-ocular motions intact bilaterally pupils equal, round, reactive to light and accommodation V,VII: smile symmetric, facial light touch sensation normal bilaterally VIII: hearing normal bilaterally IX,X: uvula rises symmetrically XI: bilateral shoulder shrug XII: midline tongue extension Motor: Right : Upper extremity   5/5  Left:     Upper extremity   5/5  Lower extremity   5/5   Lower extremity   5/5 Tone and bulk:normal tone throughout; no atrophy noted Sensory:  light touch intact throughout, bilaterally Cerebellar: No ataxia noted     Medications:  Scheduled: . dexamethasone  4 mg Oral Q24H  . doxycycline  100 mg Oral Q12H  . enoxaparin (LOVENOX) injection  40 mg Subcutaneous Q12H  . [START ON 07/17/2019] influenza vac split quadrivalent PF  0.5 mL Intramuscular Tomorrow-1000  . levETIRAcetam  500 mg Oral BID   Continuous: . cefTRIAXone (ROCEPHIN)  IV 2 g (07/15/19 2226)  .  remdesivir 100 mg in NS 100 mL 100 mg (07/16/19 0955)   BWG:[id number], chlorpheniramine-HYDROcodone, guaiFENesin-dextromethorphan, LORazepam, ondansetron (ZOFRAN) IV  Pertinent Labs/Diagnostics:   MR BRAIN WO CONTRAST  Result Date: 07/14/2019 CLINICAL DATA:  Encephalopathy. Additional history provided: Flu like symptoms, seizure-like activity EXAM: MRI HEAD WITHOUT CONTRAST TECHNIQUE: Multiplanar, multiecho pulse sequences of the brain and surrounding structures were obtained without intravenous contrast. COMPARISON:  CT head 07/13/2019 FINDINGS: Brain: There is no evidence of acute infarct. No evidence of intracranial mass. No midline shift or extra-axial fluid collection. No chronic intracranial blood products. There is a single punctate focus of T2/FLAIR hyperintensity within the right frontal lobe white matter which is nonspecific and of doubtful clinical significance (series 12, image 16). No other focal parenchymal signal abnormality is identified. The hippocampi are symmetric in size and signal. Cerebral volume is normal for age. Partially empty sella turcica. Mildly low-lying cerebellar tonsils extending 3 mm below the level of the foramen magnum. Vascular: Flow voids maintained within the proximal large arterial vessels. Skull and upper cervical spine: No focal marrow lesion. Sinuses/Orbits: Visualized orbits demonstrate no acute abnormality. Moderate bilateral sphenoid sinus mucosal thickening. Mild mucosal thickening within the remainder of the paranasal sinuses. Small bilateral mastoid effusions. IMPRESSION: 1. No evidence of acute intracranial abnormality. 2. No specific seizure focus is identified. 3. Mildly low-lying cerebellar tonsils extending 3 mm, likely developmental. 4. Paranasal sinus mucosal thickening greatest within the bilateral sphenoid sinuses. 5. Small bilateral mastoid effusions. Electronically Signed   By: Gabriella Loge DO  On: 07/14/2019 10:50   EEG adult  Result  Date: 07/14/2019 Gabriella Havens, MD     07/14/2019  4:11 PM Patient Name: Gabriella Montgomery MRN: [id number] Epilepsy Attending: Lora Montgomery Referring Physician/Provider: Dr. Lala Lund Date: 07/14/2019 Duration: 25.03 mins Patient history: 34 year old female, found to be Covid positive also had 2 episodes of seizure-like activity.  EEG to evaluate for seizures. Level of alertness: Awake, asleep AEDs during EEG study: None Technical aspects: This EEG study was done with scalp electrodes positioned according to the 10-20 International system of electrode placement. Electrical activity was acquired at a sampling rate of 500Hz  and reviewed with a high frequency filter of 70Hz  and a low frequency filter of 1Hz . EEG data were recorded continuously and digitally stored. Description: The posterior dominant rhythm consists of 9 Hz activity of moderate voltage (25-35 uV) seen predominantly in posterior head regions, symmetric and reactive to eye opening and eye closing.  Sleep was characterized by vertex waves, sleep spindles (12 to 14 Hz), maximal frontocentral.  Hyperventilation and photic stimulation were not performed. IMPRESSION: This study is within normal limits. No seizures or epileptiform discharges were seen throughout the recording. 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  Presented to [facility] with seizure like activity. Patient has been sick with fever/chills/nausea/fatigue for the past 3 days. COVID-19 positive. EEG : was negative for seizures or epileptiform discharges. MRI was negative for anything acute or seizure focus. LP was negative ( was a bloody tap). She was given a 4 g one time dose of Keppra. Given that patient has had 2 seizure events, we will start her on keppra 500 mg BID. Likely seizure disposition which has been unmasked by her inflammatory states in the setting of Covid.   Recommendations:  -- f/u outpatient neurology -- continue keppra 500 mg BID -- seizure  precautions  Per [facility] statutes, patients with seizures are not allowed to drive until  they have been seizure-free for six months. Use caution when using heavy equipment or power tools. Avoid working on ladders or at heights. Take showers instead of baths. Ensure the water temperature is not too high on the home water heater. Do not go swimming alone. When caring for infants or small children, sit down when holding, feeding, or changing them to minimize risk of injury to the child in the event you have a seizure.   Also, Maintain good sleep hygiene. Avoid alcohol.   Laurey Morale, MSN, NP-C [facility] [phone]  07/16/2019, 8:55 AM  Patient had another recurrent seizure, increasing Keppra to 750 twice daily.  If they continue, may consider continuous monitoring.  Roland Rack, MD [facility] [phone]  If 7pm- 7am, please page neurology on call as listed in [place].

## 2019-07-17 ENCOUNTER — Encounter (HOSPITAL_COMMUNITY): Payer: Self-pay | Admitting: Internal Medicine

## 2019-07-17 LAB — CBC WITH DIFFERENTIAL/PLATELET
Abs Immature Granulocytes: 0.01 10*3/uL (ref 0.00–0.07)
Basophils Absolute: 0 10*3/uL (ref 0.0–0.1)
Basophils Relative: 0 %
Eosinophils Absolute: 0 10*3/uL (ref 0.0–0.5)
Eosinophils Relative: 0 %
HCT: 35 % — ABNORMAL LOW (ref 36.0–46.0)
Hemoglobin: 11.1 g/dL — ABNORMAL LOW (ref 12.0–15.0)
Immature Granulocytes: 0 %
Lymphocytes Relative: 60 %
Lymphs Abs: 2.9 10*3/uL (ref 0.7–4.0)
MCH: 27.6 pg (ref 26.0–34.0)
MCHC: 31.7 g/dL (ref 30.0–36.0)
MCV: 87.1 fL (ref 80.0–100.0)
Monocytes Absolute: 0.3 10*3/uL (ref 0.1–1.0)
Monocytes Relative: 6 %
Neutro Abs: 1.7 10*3/uL (ref 1.7–7.7)
Neutrophils Relative %: 34 %
Platelets: 208 10*3/uL (ref 150–400)
RBC: 4.02 MIL/uL (ref 3.87–5.11)
RDW: 14.3 % (ref 11.5–15.5)
WBC: 4.8 10*3/uL (ref 4.0–10.5)
nRBC: 0 % (ref 0.0–0.2)

## 2019-07-17 LAB — COMPREHENSIVE METABOLIC PANEL
ALT: 18 U/L (ref 0–44)
AST: 15 U/L (ref 15–41)
Albumin: 2.8 g/dL — ABNORMAL LOW (ref 3.5–5.0)
Alkaline Phosphatase: 35 U/L — ABNORMAL LOW (ref 38–126)
Anion gap: 9 (ref 5–15)
BUN: 16 mg/dL (ref 6–20)
CO2: 25 mmol/L (ref 22–32)
Calcium: 8.3 mg/dL — ABNORMAL LOW (ref 8.9–10.3)
Chloride: 109 mmol/L (ref 98–111)
Creatinine, Ser: 0.87 mg/dL (ref 0.44–1.00)
GFR calc Af Amer: >60 mL/min (ref >60)
GFR calc non Af Amer: >60 mL/min (ref >60)
Glucose, Bld: 85 mg/dL (ref 70–99)
Potassium: 3.4 mmol/L — ABNORMAL LOW (ref 3.5–5.1)
Sodium: 143 mmol/L (ref 135–145)
Total Bilirubin: 0.6 mg/dL (ref 0.3–1.2)
Total Protein: 6.4 g/dL — ABNORMAL LOW (ref 6.5–8.1)

## 2019-07-17 LAB — C-REACTIVE PROTEIN: CRP: 1.1 mg/dL — ABNORMAL HIGH (ref <1.0)

## 2019-07-17 LAB — CSF CULTURE W GRAM STAIN: Culture: NO GROWTH

## 2019-07-17 LAB — D-DIMER, QUANTITATIVE: D-Dimer, Quant: 1 ug/mL-FEU — ABNORMAL HIGH (ref 0.00–0.50)

## 2019-07-17 LAB — PROCALCITONIN: Procalcitonin: <0.1 ng/mL

## 2019-07-17 MED ORDER — POTASSIUM CHLORIDE CRYS ER 20 MEQ PO TBCR
40.0000 meq | EXTENDED_RELEASE_TABLET | Freq: Once | ORAL | Status: AC
  Administered 2019-07-17: 40 meq via ORAL
  Filled 2019-07-17: qty 2

## 2019-07-17 MED ORDER — MAGNESIUM SULFATE IN D5W 1-5 GM/100ML-% IV SOLN
1.0000 g | Freq: Once | INTRAVENOUS | Status: AC
  Administered 2019-07-17: 1 g via INTRAVENOUS
  Filled 2019-07-17: qty 100

## 2019-07-17 NOTE — Plan of Care (Signed)
  Problem: Clinical Measurements: Goal: Respiratory complications will improve Outcome: Progressing   Problem: Activity: Goal: Risk for activity intolerance will decrease Outcome: Progressing   

## 2019-07-17 NOTE — Progress Notes (Signed)
PROGRESS NOTE                                                                                                                                                                                                             Patient Demographics:    Gabriella Montgomery, is a 34 y.o. female, DOB - 1985/12/26, MGN:[id number]  Outpatient Primary MD for the patient is Patient, No Pcp Per    LOS - 3  Admit date - 07/13/2019    CC - Fever     Brief Narrative - Gabriella Montgomery is a 34 y.o. female with medical history significant of PE (looks like occurred while pregnant, no longer on anticoagulation).  Patient apparently was having cough high fevers and some body aches for the last few days, on the day of hospital visit she had 2 episodes of seizure-like activity after which she was admitted to the hospital, note 1 episode happened in the ER.  Was found to have COVID-19 pneumonia, her LP was negative, CT head, CT C-spine, MRI brain were nonacute.     Subjective:   Patient in bed, appears comfortable, denies any headache, no fever, no chest pain or pressure, no shortness of breath , no abdominal pain. No focal weakness.   Assessment  & Plan :     1.  Acute Covid 19 Viral Pneumonitis during the ongoing 2020 Covid 19 Pandemic - she has moderate disease has been started on steroids and remdesivir.  Will monitor inflammatory markers, advance activity and monitor closely clinically.  Encouraged the patient to sit up in chair in the daytime use I-S and flutter valve for pulmonary toiletry and then prone in bed when at night.   Recent Labs  Lab 07/13/19 2133 07/14/19 0557 07/15/19 0407 07/16/19 0412 07/17/19 0359  CRP  --  3.8* 4.2* 1.4* 1.1*  DDIMER  --  2.11* 1.34* 1.14* 1.00*  PROCALCITON  --  0.21 0.14 <0.10 <0.10  SARSCOV2NAA POSITIVE*  --   --   --   --     Hepatic Function Latest Ref Rng & Units 07/17/2019 07/16/2019 07/15/2019    Total Protein 6.5 - 8.1 g/dL 6.4(L) 6.0(L) 6.4(L)  Albumin 3.5 - 5.0 g/dL 2.8(L) 2.7(L) 2.8(L)  AST 15 - 41 U/L 15 17 23   ALT 0 - 44 U/L 18 19 21   Alk  Phosphatase 38 - 126 U/L 35(L) 33(L) 37(L)  Total Bilirubin 0.3 - 1.2 mg/dL 0.6 0.6 0.6    2. Seizures x 3 now, new onset.  Brain imaging including CT and MRI nonacute, EEG stable, patient headache free, LP reveals CSF which is consistent with a bloody tap but no infection, initially was stable, she so far has had 3 episodes of seizures one at home and 2 witnessed in the hospital last 1 on 07/16/2019, on as needed Ativan for breakthrough seizures along with Depakote, dose increased on 07/16/2019, neurology on board.  Being monitored closely.  If she has another seizure she will require 24-hour EEG monitor if seizure-free for 24 hours then will be discharged home with driving restrictions and outpatient neurology follow-up.  3.  Fever related dehydration and AKI and metabolic acidosis.  Resolved after hydration.  4.  Possible community-acquired pneumonia with interstitial infiltrates and some mention of focal infiltrates on CT.  Will continue Rocephin-doxycycline combination and monitor.  5.  Hypokalemia.  Replace.    6. Mild viral infection induced leukopenia.  Resolved .    Condition - Fair  Family Communication  :  Mother - [phone] on 07/14/19, updated again on 07/15/2019, 07/16/19  Code Status :  Full  Diet :   Diet Order            Diet regular Room service appropriate? Yes; Fluid consistency: Thin  Diet effective now               Disposition Plan  : Stay in the hospital for treatment of COVID-19 pneumonia, new onset seizure treatment and eval  Consults  : Neuro  Procedures  :    CTA -  No central pulmonary embolism. Respiratory motion artifact precludes exclusion of a peripheral pulmonary embolism. Patchy alveolar and airspace disease in the right lung, most confluent in the right middle lobe, likely pneumonia. A  couple small focal alveolar opacities in the left base, also probably infectious. Unenhanced CT chest recommended in 3 months to document resolution. Bulky right hilar and subcarinal lymph nodes, likely reactive. Pulmonary artery hypertension. Hepatic steatosis. A number of small noncalcified lymph nodes at the mesenteric root, likely a reactive adenitis.  MRI Brain - Non acute  CT C spine - Non acute  EEG.  No epileptiform or seizure focus.  PUD Prophylaxis : None  DVT Prophylaxis  :  Lovenox    Lab Results  Component Value Date   PLT 208 07/17/2019    Inpatient Medications  Scheduled Meds:  dexamethasone  4 mg Oral Q24H   doxycycline  100 mg Oral Q12H   enoxaparin (LOVENOX) injection  40 mg Subcutaneous Q12H   influenza vac split quadrivalent PF  0.5 mL Intramuscular Tomorrow-1000   levETIRAcetam  750 mg Oral BID   Continuous Infusions:  cefTRIAXone (ROCEPHIN)  IV 2 g (07/16/19 2117)   remdesivir 100 mg in NS 100 mL 100 mg (07/16/19 0955)   PRN Meds:.acetaminophen, chlorpheniramine-HYDROcodone, guaiFENesin-dextromethorphan, loperamide, LORazepam, ondansetron (ZOFRAN) IV  Antibiotics  :    Anti-infectives (From admission, onward)   Start     Dose/Rate Route Frequency Ordered Stop   07/15/19 1000  remdesivir 100 mg in sodium chloride 0.9 % 100 mL IVPB  Status:  Discontinued     100 mg 200 mL/hr over 30 Minutes Intravenous Daily 07/14/19 0511 07/14/19 0514   07/15/19 1000  remdesivir 100 mg in sodium chloride 0.9 % 100 mL IVPB     100 mg 200 mL/hr over  30 Minutes Intravenous Daily 07/14/19 0515 07/19/19 0959   07/14/19 0600  remdesivir 100 mg in sodium chloride 0.9 % 100 mL IVPB     100 mg 200 mL/hr over 30 Minutes Intravenous Every 1 hr x 2 07/14/19 0515 07/14/19 0746   07/14/19 0515  remdesivir 200 mg in sodium chloride 0.9% 250 mL IVPB  Status:  Discontinued     200 mg 580 mL/hr over 30 Minutes Intravenous Once 07/14/19 0511 07/14/19 0514   07/14/19 0200   levofloxacin (LEVAQUIN) IVPB 750 mg  Status:  Discontinued     750 mg 100 mL/hr over 90 Minutes Intravenous Every 24 hours 07/14/19 0124 07/14/19 0127   07/14/19 0130  cefTRIAXone (ROCEPHIN) 2 g in sodium chloride 0.9 % 100 mL IVPB     2 g 200 mL/hr over 30 Minutes Intravenous Daily at bedtime 07/14/19 0124     07/14/19 0115  cefTRIAXone (ROCEPHIN) 1 g in sodium chloride 0.9 % 100 mL IVPB  Status:  Discontinued     1 g 200 mL/hr over 30 Minutes Intravenous  Once 07/14/19 0105 07/14/19 0114   07/14/19 0115  doxycycline (VIBRA-TABS) tablet 100 mg  Status:  Discontinued     100 mg Oral  Once 07/14/19 0105 07/14/19 0114   07/14/19 0115  cefTRIAXone (ROCEPHIN) 2 g in sodium chloride 0.9 % 100 mL IVPB  Status:  Discontinued     2 g 200 mL/hr over 30 Minutes Intravenous Every 24 hours 07/14/19 0114 07/14/19 0118   07/14/19 0115  doxycycline (VIBRA-TABS) tablet 100 mg     100 mg Oral Every 12 hours 07/14/19 0114         Time Spent in minutes  30   Lala Lund M.D on 07/17/2019 at 10:23 AM  To page go to [email or web address] - password [email or web address]  [facility] -  Office  [phone]    See all Orders from today for further details    Objective:   Vitals:   07/16/19 2300 07/17/19 0000 07/17/19 0100 07/17/19 0400  BP:  111/74  131/68  Pulse:  64  76  Resp: 20 20 20 18   Temp:    98.3 F (36.8 C)  TempSrc:    Oral  SpO2:  (!) 89%  95%  Weight:      Height:        Wt Readings from Last 3 Encounters:  07/14/19 99.8 kg  06/30/18 99.8 kg    No intake or output data in the 24 hours ending 07/17/19 1023   Physical Exam  Awake Alert, No new F.N deficits, Normal affect [place].AT,PERRAL Supple Neck,No JVD, No cervical lymphadenopathy appriciated.  Symmetrical Chest wall movement, Good air movement bilaterally, CTAB RRR,No Gallops, Rubs or new Murmurs, No Parasternal Heave +ve B.Sounds, Abd Soft, No tenderness, No organomegaly appriciated, No rebound - guarding or rigidity. No  Cyanosis, Clubbing or edema, No new Rash or bruise     Data Review:    CBC Recent Labs  Lab 07/13/19 2012 07/14/19 0236 07/15/19 0407 07/16/19 0412 07/17/19 0359  WBC 6.6 4.5 2.6* 4.7 4.8  HGB 15.1* 12.2 10.7* 11.0* 11.1*  HCT 47.3* 39.5 33.5* 34.2* 35.0*  PLT 207 150 143* 176 208  MCV 84.8 87.2 85.7 85.5 87.1  MCH 27.1 26.9 27.4 27.5 27.6  MCHC 31.9 30.9 31.9 32.2 31.7  RDW 14.5 14.6 14.5 14.4 14.3  LYMPHSABS 2.6  --  1.3 2.0 2.9  MONOABS 0.1  --  0.1 0.2 0.3  EOSABS 0.0  --  0.0 0.0 0.0  BASOSABS 0.0  --  0.0 0.0 0.0    Chemistries  Recent Labs  Lab 07/13/19 2012 07/14/19 0236 07/15/19 0407 07/16/19 0412 07/17/19 0359  NA 139 137 139 143 143  K 4.1 3.9 3.7 3.8 3.4*  CL 104 107 107 109 109  CO2 21* 18* 21* 22 25  GLUCOSE 123* 100* 124* 111* 85  BUN 19 16 11 11 16   CREATININE 1.80* 1.31* 0.92 0.82 0.87  CALCIUM 9.0 7.8* 8.0* 8.1* 8.3*  AST 35 31 23 17 15   ALT 27 23 21 19 18   ALKPHOS 52 37* 37* 33* 35*  BILITOT 0.6 0.6 0.6 0.6 0.6  INR 1.0  --   --   --   --      ------------------------------------------------------------------------------------------------------------------ No results for input(s): CHOL, HDL, LDLCALC, TRIG, CHOLHDL, LDLDIRECT in the last 72 hours.  No results found for: HGBA1C ------------------------------------------------------------------------------------------------------------------ No results for input(s): TSH, T4TOTAL, T3FREE, THYROIDAB in the last 72 hours.  Invalid input(s): FREET3  Cardiac Enzymes No results for input(s): CKMB, TROPONINI, MYOGLOBIN in the last 168 hours.  Invalid input(s): CK ------------------------------------------------------------------------------------------------------------------ No results found for: BNP  Micro Results Recent Results (from the past 240 hour(s))  Blood Culture (routine x 2)     Status: None (Preliminary result)   Collection Time: 07/13/19  8:11 PM   Specimen: BLOOD  Result  Value Ref Range Status   Specimen Description BLOOD BLOOD RIGHT FOREARM  Final   Special Requests   Final    BOTTLES DRAWN AEROBIC AND ANAEROBIC Blood Culture adequate volume   Culture   Final    NO GROWTH 3 DAYS Performed at [facility], [place] [address], [place], [place] [address]    Report Status PENDING  Incomplete  Blood Culture (routine x 2)     Status: None (Preliminary result)   Collection Time: 07/13/19  9:07 PM   Specimen: BLOOD LEFT HAND  Result Value Ref Range Status   Specimen Description BLOOD LEFT HAND  Final   Special Requests   Final    BOTTLES DRAWN AEROBIC AND ANAEROBIC Blood Culture results may not be optimal due to an inadequate volume of blood received in culture bottles   Culture   Final    NO GROWTH 3 DAYS Performed at [facility], [place] [address] [place], [place] [address]    Report Status PENDING  Incomplete  SARS CORONAVIRUS 2 (TAT 6-24 HRS) Nasopharyngeal Nasopharyngeal Swab     Status: Abnormal   Collection Time: 07/13/19  9:33 PM   Specimen: Nasopharyngeal Swab  Result Value Ref Range Status   SARS Coronavirus 2 POSITIVE (A) NEGATIVE Final    Comment: RESULT CALLED TO, READ BACK BY AND VERIFIED WITH: RN MEGAN RUGGIERO AT 0500 BY [place] ON 07/14/2019 (NOTE) SARS-CoV-2 target nucleic acids are DETECTED. The SARS-CoV-2 RNA is generally detectable in upper and lower respiratory specimens during the acute phase of infection. Positive results are indicative of the presence of SARS-CoV-2 RNA. Clinical correlation with patient history and other diagnostic information is  necessary to determine patient infection status. Positive results do not rule out bacterial infection or co-infection with other viruses.  The expected result is Negative. Fact Sheet for Patients: SugarRoll.be Fact Sheet for Healthcare Providers: https://www.woods-mathews.com/ This test is not yet approved or cleared by the  [place] FDA and  has been authorized for detection and/or diagnosis of SARS-CoV-2 by FDA under an Emergency Use Authorization (EUA). This EUA will  remain  in effect (meaning this t est can be used) for the duration of the COVID-19 declaration under Section 564(b)(1) of the Act, 21 U.S.C. section 360bbb-3(b)(1), unless the authorization is terminated or revoked sooner. Performed at [facility], [place] [address] [place], [place] [address]   Urine culture     Status: None   Collection Time: 07/13/19 11:08 PM   Specimen: In/Out Cath Urine  Result Value Ref Range Status   Specimen Description IN/OUT CATH URINE  Final   Special Requests NONE  Final   Culture   Final    NO GROWTH Performed at [facility], [place] [address], [place], [place] [address]    Report Status 07/15/2019 FINAL  Final  CSF culture     Status: None   Collection Time: 07/14/19  1:08 AM   Specimen: Back; Cerebrospinal Fluid  Result Value Ref Range Status   Specimen Description BACK  Final   Special Requests NONE  Final   Gram Stain   Final    CYTOSPIN SMEAR WBC PRESENT, PREDOMINANTLY MONONUCLEAR NO ORGANISMS SEEN    Culture   Final    NO GROWTH 3 DAYS Performed at [facility], [place] [address] [place], [place] [address]    Report Status 07/17/2019 FINAL  Final    Radiology Reports CT Head Wo Contrast  Result Date: 07/13/2019 CLINICAL DATA:  Seizure, fall EXAM: CT HEAD WITHOUT CONTRAST CT CERVICAL SPINE WITHOUT CONTRAST TECHNIQUE: Multidetector CT imaging of the head and cervical spine was performed following the standard protocol without intravenous contrast. Multiplanar CT image reconstructions of the cervical spine were also generated. COMPARISON:  None. FINDINGS: CT HEAD FINDINGS Brain: No acute territorial infarction, hemorrhage or intracranial mass. The ventricles are nonenlarged. Vascular: No hyperdense vessel or unexpected calcification. Skull: Normal. Negative for fracture or focal  lesion. Sinuses/Orbits: Mucosal thickening in the sinuses. Other: None CT CERVICAL SPINE FINDINGS Alignment: Reversal of cervical lordosis. No subluxation. Facet alignment is normal Skull base and vertebrae: No acute fracture. No primary bone lesion or focal pathologic process. Soft tissues and spinal canal: No prevertebral fluid or swelling. No visible canal hematoma. Disc levels:  Within normal limits Upper chest: Negative. Other: None IMPRESSION: 1. Negative non contrasted CT appearance of the brain 2. Reversal of cervical lordosis.  No acute osseous abnormality. Electronically Signed   By: Donavan Foil M.D.   On: 07/13/2019 21:54   CT Angio Chest PE W/Cm &/Or Wo Cm  Result Date: 07/14/2019 CLINICAL DATA:  Left-sided chest and abdominal pain. History of pulmonary embolism. EXAM: CT ANGIOGRAPHY CHEST CT ABDOMEN AND PELVIS WITH CONTRAST TECHNIQUE: Multidetector CT imaging of the chest was performed using the standard protocol during bolus administration of intravenous contrast. Multiplanar CT image reconstructions and MIPs were obtained to evaluate the vascular anatomy. Multidetector CT imaging of the abdomen and pelvis was performed using the standard protocol during bolus administration of intravenous contrast. CONTRAST:  39m OMNIPAQUE IOHEXOL 350 MG/ML SOLN COMPARISON:  None. FINDINGS: CTA CHEST FINDINGS Cardiovascular: Pulmonary artery hypertension, the pulmonary trunk measuring 33 mm. No central pulmonary embolism. Patient respiratory motion precludes evaluation of the more peripheral bilateral pulmonary arteries. No thoracic aorta calcified atherosclerosis or aneurysm. Mediastinum/Nodes: Bulky right hilar and subcarinal lymph nodes which are noncalcified and measure up to 18 mm diameter. Normal thyroid and decompressed esophagus. Lungs/Pleura: Patchy alveolar and airspace disease throughout the right lung and most confluent in the right middle lobe with air bronchograms. A couple small patchy  ground-glass opacities in the left base. No pleural effusion or pneumothorax. A 4 mm pleural based sub solid nodule along the left major fissure, possibly a reactive subpleural lymph node. Musculoskeletal: Normal. Review of the MIP images confirms the above findings. CT ABDOMEN and PELVIS FINDINGS Hepatobiliary: Fatty infiltration of the hepatic parenchyma. Normal gallbladder and biliary tree. Pancreas: Normal. Spleen: Normal. Adrenals/Urinary Tract: Normal. Stomach/Bowel: Normal appendix, axial series 5, image 63. No mucosal thickening or obstruction. Vascular/Lymphatic: A number of small noncalcified lymph nodes at the mesenteric root, likely reactive, measuring up to 17 by 15 mm. No abdominal aortic aneurysm, dissection or calcified atherosclerosis. Patent portal and hepatic veins. 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 Reproductive: No apparent abnormality. Other: No retroperitoneal hematoma, free intraperitoneal fluid or air. Musculoskeletal: Normal. The Review of the MIP images confirms the above findings. IMPRESSION: No central pulmonary embolism. Respiratory motion artifact precludes exclusion of a peripheral pulmonary embolism. Patchy alveolar and airspace disease in the right lung, most confluent in the right middle lobe, likely pneumonia. A couple small focal alveolar opacities in the left base, also probably infectious. Unenhanced CT chest recommended in 3 months to document resolution. Bulky right hilar and subcarinal lymph nodes, likely reactive. Pulmonary artery hypertension. Hepatic steatosis. A number of small noncalcified lymph nodes at the mesenteric root, likely a reactive adenitis. Electronically Signed   By: Revonda Humphrey   On: 07/14/2019 00:14   CT Cervical Spine Wo Contrast  Result Date: 07/13/2019 CLINICAL DATA:  Seizure, fall EXAM: CT HEAD WITHOUT CONTRAST CT CERVICAL SPINE WITHOUT CONTRAST TECHNIQUE: Multidetector CT imaging of the  head and cervical spine was performed following the standard protocol without intravenous contrast. Multiplanar CT image reconstructions of the cervical spine were also generated. COMPARISON:  None. FINDINGS: CT HEAD FINDINGS Brain: No acute territorial infarction, hemorrhage or intracranial mass. The ventricles are nonenlarged. Vascular: No hyperdense vessel or unexpected calcification. Skull: Normal. Negative for fracture or focal lesion. Sinuses/Orbits: Mucosal thickening in the sinuses. Other: None CT CERVICAL SPINE FINDINGS Alignment: Reversal of cervical lordosis. No subluxation. Facet alignment is normal Skull base and vertebrae: No acute fracture. No primary bone lesion or focal pathologic process. Soft tissues and spinal canal: No prevertebral fluid or swelling. No visible canal hematoma. Disc levels:  Within normal limits Upper chest: Negative. Other: None IMPRESSION: 1. Negative non contrasted CT appearance of the brain 2. Reversal of cervical lordosis.  No acute osseous abnormality. Electronically Signed   By: Donavan Foil M.D.   On: 07/13/2019 21:54   MR BRAIN WO CONTRAST  Result Date: 07/14/2019 CLINICAL DATA:  Encephalopathy. Additional history provided: Flu like symptoms, seizure-like activity EXAM: MRI HEAD WITHOUT CONTRAST TECHNIQUE: Multiplanar, multiecho pulse sequences of the brain and surrounding structures were obtained without intravenous contrast. COMPARISON:  CT head 07/13/2019 FINDINGS: Brain: There is no evidence of acute infarct. No evidence of intracranial mass. No midline shift or extra-axial fluid collection. No chronic intracranial blood products. There is a single punctate focus of T2/FLAIR hyperintensity within the right frontal lobe white matter which is nonspecific and of doubtful clinical significance (series 12, image 16). No other focal parenchymal signal abnormality is identified. The hippocampi are symmetric in size and signal. Cerebral volume is normal for age.  Partially empty sella turcica. Mildly low-lying cerebellar tonsils extending 3 mm below the level of the foramen magnum. Vascular: Flow voids maintained within the proximal large arterial vessels. Skull and upper cervical spine: No focal  marrow lesion. Sinuses/Orbits: Visualized orbits demonstrate no acute abnormality. Moderate bilateral sphenoid sinus mucosal thickening. Mild mucosal thickening within the remainder of the paranasal sinuses. Small bilateral mastoid effusions. IMPRESSION: 1. No evidence of acute intracranial abnormality. 2. No specific seizure focus is identified. 3. Mildly low-lying cerebellar tonsils extending 3 mm, likely developmental. 4. Paranasal sinus mucosal thickening greatest within the bilateral sphenoid sinuses. 5. Small bilateral mastoid effusions. Electronically Signed   By: Kellie Simmering DO   On: 07/14/2019 10:50   CT Abdomen Pelvis W Contrast  Result Date: 07/14/2019 CLINICAL DATA:  Left-sided chest and abdominal pain. History of pulmonary embolism. EXAM: CT ANGIOGRAPHY CHEST CT ABDOMEN AND PELVIS WITH CONTRAST TECHNIQUE: Multidetector CT imaging of the chest was performed using the standard protocol during bolus administration of intravenous contrast. Multiplanar CT image reconstructions and MIPs were obtained to evaluate the vascular anatomy. Multidetector CT imaging of the abdomen and pelvis was performed using the standard protocol during bolus administration of intravenous contrast. CONTRAST:  46m OMNIPAQUE IOHEXOL 350 MG/ML SOLN COMPARISON:  None. FINDINGS: CTA CHEST FINDINGS Cardiovascular: Pulmonary artery hypertension, the pulmonary trunk measuring 33 mm. No central pulmonary embolism. Patient respiratory motion precludes evaluation of the more peripheral bilateral pulmonary arteries. No thoracic aorta calcified atherosclerosis or aneurysm. Mediastinum/Nodes: Bulky right hilar and subcarinal lymph nodes which are noncalcified and measure up to 18 mm diameter. Normal  thyroid and decompressed esophagus. Lungs/Pleura: Patchy alveolar and airspace disease throughout the right lung and most confluent in the right middle lobe with air bronchograms. A couple small patchy ground-glass opacities in the left base. No pleural effusion or pneumothorax. A 4 mm pleural based sub solid nodule along the left major fissure, possibly a reactive subpleural lymph node. Musculoskeletal: Normal. Review of the MIP images confirms the above findings. CT ABDOMEN and PELVIS FINDINGS Hepatobiliary: Fatty infiltration of the hepatic parenchyma. Normal gallbladder and biliary tree. Pancreas: Normal. Spleen: Normal. Adrenals/Urinary Tract: Normal. Stomach/Bowel: Normal appendix, axial series 5, image 63. No mucosal thickening or obstruction. Vascular/Lymphatic: A number of small noncalcified lymph nodes at the mesenteric root, likely reactive, measuring up to 17 by 15 mm. No abdominal aortic aneurysm, dissection or calcified atherosclerosis. Patent portal and hepatic veins. 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 Reproductive: No apparent abnormality. Other: No retroperitoneal hematoma, free intraperitoneal fluid or air. Musculoskeletal: Normal. The Review of the MIP images confirms the above findings. IMPRESSION: No central pulmonary embolism. Respiratory motion artifact precludes exclusion of a peripheral pulmonary embolism. Patchy alveolar and airspace disease in the right lung, most confluent in the right middle lobe, likely pneumonia. A couple small focal alveolar opacities in the left base, also probably infectious. Unenhanced CT chest recommended in 3 months to document resolution. Bulky right hilar and subcarinal lymph nodes, likely reactive. Pulmonary artery hypertension. Hepatic steatosis. A number of small noncalcified lymph nodes at the mesenteric root, likely a reactive adenitis. Electronically Signed   By: RRevonda Humphrey  On: 07/14/2019 00:14    DG Chest Port 1 View  Result Date: 07/13/2019 CLINICAL DATA:  Fever.  Altered mental status. EXAM: PORTABLE CHEST 1 VIEW COMPARISON:  None. FINDINGS: Cardiac silhouette normal in size. No mediastinal or hilar masses or evidence of adenopathy. Mild linear/interstitial type opacities right lower lung, which may reflect atelectasis. Lungs otherwise clear. No pleural effusion or pneumothorax. Skeletal structures are intact. IMPRESSION: 1. Mild area linear/interstitial opacity at the right lung base, most likely atelectasis and crowding due  to low lung volumes and patient rotation. A small focus pneumonia is possible. No other abnormality. Electronically Signed   By: Lajean Manes M.D.   On: 07/13/2019 19:34   EEG adult  Result Date: 07/14/2019 Lora Havens, MD     07/14/2019  4:11 PM Patient Name: ZUHA DEJONGE MRN: [id number] Epilepsy Attending: Lora Havens Referring Physician/Provider: Dr. Lala Lund Date: 07/14/2019 Duration: 25.03 mins Patient history: 34 year old female, found to be Covid positive also had 2 episodes of seizure-like activity.  EEG to evaluate for seizures. Level of alertness: Awake, asleep AEDs during EEG study: None Technical aspects: This EEG study was done with scalp electrodes positioned according to the 10-20 International system of electrode placement. Electrical activity was acquired at a sampling rate of 500Hz  and reviewed with a high frequency filter of 70Hz  and a low frequency filter of 1Hz . EEG data were recorded continuously and digitally stored. Description: The posterior dominant rhythm consists of 9 Hz activity of moderate voltage (25-35 uV) seen predominantly in posterior head regions, symmetric and reactive to eye opening and eye closing.  Sleep was characterized by vertex waves, sleep spindles (12 to 14 Hz), maximal frontocentral.  Hyperventilation and photic stimulation were not performed. IMPRESSION: This study is within normal limits. No seizures or  epileptiform discharges were seen throughout the recording. Priyanka Barbra Sarks

## 2019-07-17 NOTE — Plan of Care (Signed)
No seizures per RN overnight.  Continue Keppra 750BID.  If has seizures again, will need LTM EEG.  -- Milon Dikes, MD [facility]

## 2019-07-18 LAB — BASIC METABOLIC PANEL
Anion gap: 7 (ref 5–15)
BUN: 16 mg/dL (ref 6–20)
CO2: 24 mmol/L (ref 22–32)
Calcium: 8.2 mg/dL — ABNORMAL LOW (ref 8.9–10.3)
Chloride: 109 mmol/L (ref 98–111)
Creatinine, Ser: 0.85 mg/dL (ref 0.44–1.00)
GFR calc Af Amer: >60 mL/min (ref >60)
GFR calc non Af Amer: >60 mL/min (ref >60)
Glucose, Bld: 109 mg/dL — ABNORMAL HIGH (ref 70–99)
Potassium: 3.3 mmol/L — ABNORMAL LOW (ref 3.5–5.1)
Sodium: 140 mmol/L (ref 135–145)

## 2019-07-18 LAB — CULTURE, BLOOD (ROUTINE X 2)
Culture: NO GROWTH
Culture: NO GROWTH
Special Requests: ADEQUATE

## 2019-07-18 LAB — MAGNESIUM: Magnesium: 2.2 mg/dL (ref 1.7–2.4)

## 2019-07-18 MED ORDER — POTASSIUM CHLORIDE CRYS ER 20 MEQ PO TBCR
40.0000 meq | EXTENDED_RELEASE_TABLET | Freq: Once | ORAL | Status: AC
  Administered 2019-07-18: 09:00:00 40 meq via ORAL
  Filled 2019-07-18: qty 2

## 2019-07-18 NOTE — Plan of Care (Signed)
  Problem: Clinical Measurements: Goal: Respiratory complications will improve Outcome: Progressing   

## 2019-07-18 NOTE — Progress Notes (Signed)
PROGRESS NOTE                                                                                                                                                                                                             Patient Demographics:    Gabriella Montgomery, is a 34 y.o. female, DOB - 18-Feb-1986, ZJI:[id number]  Outpatient Primary MD for the patient is Patient, No Pcp Per    LOS - 4  Admit date - 07/13/2019    CC - Fever     Brief Narrative - Gabriella Montgomery is a 34 y.o. female with medical history significant of PE (looks like occurred while pregnant, no longer on anticoagulation).  Patient apparently was having cough high fevers and some body aches for the last few days, on the day of hospital visit she had 2 episodes of seizure-like activity after which she was admitted to the hospital, note 1 episode happened in the ER.  Was found to have COVID-19 pneumonia, her LP was negative, CT head, CT C-spine, MRI brain were nonacute.     Subjective:   Patient in bed, appears comfortable, denies any headache, no fever, no chest pain or pressure, no shortness of breath , no abdominal pain. No focal weakness.    Assessment  & Plan :     1.  Acute Covid 19 Viral Pneumonitis during the ongoing 2020 Covid 19 Pandemic - she has moderate disease has been started on steroids and remdesivir, finishing her course.  Will monitor inflammatory markers, advance activity and monitor closely clinically.  Encouraged to advance activity.  Encouraged the patient to sit up in chair in the daytime use I-S and flutter valve for pulmonary toiletry and then prone in bed when at night.   Recent Labs  Lab 07/13/19 2133 07/14/19 0557 07/15/19 0407 07/16/19 0412 07/17/19 0359  CRP  --  3.8* 4.2* 1.4* 1.1*  DDIMER  --  2.11* 1.34* 1.14* 1.00*  PROCALCITON  --  0.21 0.14 <0.10 <0.10  SARSCOV2NAA POSITIVE*  --   --   --   --     Hepatic Function  Latest Ref Rng & Units 07/17/2019 07/16/2019 07/15/2019  Total Protein 6.5 - 8.1 g/dL 6.4(L) 6.0(L) 6.4(L)  Albumin 3.5 - 5.0 g/dL 2.8(L) 2.7(L) 2.8(L)  AST 15 - 41 U/L 15 17 23   ALT 0 -  44 U/L 18 19 21   Alk Phosphatase 38 - 126 U/L 35(L) 33(L) 37(L)  Total Bilirubin 0.3 - 1.2 mg/dL 0.6 0.6 0.6    2. Seizures x 3 now, new onset.  Brain imaging including CT and MRI nonacute, EEG stable, patient headache free, LP reveals CSF which is consistent with a bloody tap but no infection, initially was stable, she so far has had 3 episodes of seizures one at home and 2 witnessed in the hospital last 1 on 07/16/2019, on as needed Ativan for breakthrough seizures along with now seems to have stabilized on Keppra 750 twice daily, neurology on board.  Being monitored closely.  If she has another seizure she will require 24-hour EEG monitor, advance activity if remains seizure-free and finishes her Covid treatment likely discharge home on 07/19/2019.  3.  Fever related dehydration and AKI and metabolic acidosis.  Resolved after hydration.  4.  Possible community-acquired pneumonia with interstitial infiltrates and some mention of focal infiltrates on CT.  Will continue Rocephin-doxycycline combination and monitor.  5.  Hypokalemia.  Replace.    6. Mild viral infection induced leukopenia.  Resolved .    Condition - Fair  Family Communication  :  Mother - [phone] on 07/14/19, updated again on 07/15/2019, 07/16/19  Code Status :  Full  Diet :   Diet Order            Diet regular Room service appropriate? Yes; Fluid consistency: Thin  Diet effective now               Disposition Plan  : Stay in the hospital for treatment of COVID-19 pneumonia, new onset seizure treatment and eval, if remains seizure-free for 24 hours discharged home on 07/19/2018.  Of note she has had 2 breakthrough seizures this admission here.  Consults  : Neuro  Procedures  :    CTA -  No central pulmonary embolism.  Respiratory motion artifact precludes exclusion of a peripheral pulmonary embolism. Patchy alveolar and airspace disease in the right lung, most confluent in the right middle lobe, likely pneumonia. A couple small focal alveolar opacities in the left base, also probably infectious. Unenhanced CT chest recommended in 3 months to document resolution. Bulky right hilar and subcarinal lymph nodes, likely reactive. Pulmonary artery hypertension. Hepatic steatosis. A number of small noncalcified lymph nodes at the mesenteric root, likely a reactive adenitis.  MRI Brain - Non acute  CT C spine - Non acute  EEG.  No epileptiform or seizure focus.  PUD Prophylaxis : None  DVT Prophylaxis  :  Lovenox    Lab Results  Component Value Date   PLT 208 07/17/2019    Inpatient Medications  Scheduled Meds:  dexamethasone  4 mg Oral Q24H   doxycycline  100 mg Oral Q12H   enoxaparin (LOVENOX) injection  40 mg Subcutaneous Q12H   influenza vac split quadrivalent PF  0.5 mL Intramuscular Tomorrow-1000   levETIRAcetam  750 mg Oral BID   Continuous Infusions:  cefTRIAXone (ROCEPHIN)  IV 2 g (07/17/19 2055)   PRN Meds:.acetaminophen, chlorpheniramine-HYDROcodone, guaiFENesin-dextromethorphan, loperamide, LORazepam, ondansetron (ZOFRAN) IV  Antibiotics  :    Anti-infectives (From admission, onward)   Start     Dose/Rate Route Frequency Ordered Stop   07/15/19 1000  remdesivir 100 mg in sodium chloride 0.9 % 100 mL IVPB  Status:  Discontinued     100 mg 200 mL/hr over 30 Minutes Intravenous Daily 07/14/19 0511 07/14/19 0514   07/15/19 1000  remdesivir  100 mg in sodium chloride 0.9 % 100 mL IVPB     100 mg 200 mL/hr over 30 Minutes Intravenous Daily 07/14/19 0515 07/18/19 0947   07/14/19 0600  remdesivir 100 mg in sodium chloride 0.9 % 100 mL IVPB     100 mg 200 mL/hr over 30 Minutes Intravenous Every 1 hr x 2 07/14/19 0515 07/14/19 0746   07/14/19 0515  remdesivir 200 mg in sodium chloride  0.9% 250 mL IVPB  Status:  Discontinued     200 mg 580 mL/hr over 30 Minutes Intravenous Once 07/14/19 0511 07/14/19 0514   07/14/19 0200  levofloxacin (LEVAQUIN) IVPB 750 mg  Status:  Discontinued     750 mg 100 mL/hr over 90 Minutes Intravenous Every 24 hours 07/14/19 0124 07/14/19 0127   07/14/19 0130  cefTRIAXone (ROCEPHIN) 2 g in sodium chloride 0.9 % 100 mL IVPB     2 g 200 mL/hr over 30 Minutes Intravenous Daily at bedtime 07/14/19 0124 07/18/19 2359   07/14/19 0115  cefTRIAXone (ROCEPHIN) 1 g in sodium chloride 0.9 % 100 mL IVPB  Status:  Discontinued     1 g 200 mL/hr over 30 Minutes Intravenous  Once 07/14/19 0105 07/14/19 0114   07/14/19 0115  doxycycline (VIBRA-TABS) tablet 100 mg  Status:  Discontinued     100 mg Oral  Once 07/14/19 0105 07/14/19 0114   07/14/19 0115  cefTRIAXone (ROCEPHIN) 2 g in sodium chloride 0.9 % 100 mL IVPB  Status:  Discontinued     2 g 200 mL/hr over 30 Minutes Intravenous Every 24 hours 07/14/19 0114 07/14/19 0118   07/14/19 0115  doxycycline (VIBRA-TABS) tablet 100 mg     100 mg Oral Every 12 hours 07/14/19 0114 07/18/19 2359       Time Spent in minutes  [place] M.D on 07/18/2019 at 10:14 AM  To page go to [email or web address] - password [facility]  [facility] -  Office  [phone]    See all Orders from today for further details    Objective:   Vitals:   07/17/19 1324 07/17/19 1600 07/17/19 2000 07/18/19 0905  BP: (!) 144/93 130/82  112/90  Pulse: 89 73  67  Resp: (!) 21 (!) 22 20 16   Temp: 97.8 F (36.6 C) 98.3 F (36.8 C)  97.7 F (36.5 C)  TempSrc: Oral Oral  Oral  SpO2: 100% 95%  95%  Weight:      Height:        Wt Readings from Last 3 Encounters:  07/14/19 99.8 kg  06/30/18 99.8 kg     Intake/Output Summary (Last 24 hours) at 07/18/2019 1014 Last data filed at 07/18/2019 0917 Gross per 24 hour  Intake 300 ml  Output --  Net 300 ml     Physical Exam  Awake Alert, No new F.N deficits, Normal  affect [place].AT,PERRAL Supple Neck,No JVD, No cervical lymphadenopathy appriciated.  Symmetrical Chest wall movement, Good air movement bilaterally, CTAB RRR,No Gallops, Rubs or new Murmurs, No Parasternal Heave +ve B.Sounds, Abd Soft, No tenderness, No organomegaly appriciated, No rebound - guarding or rigidity. No Cyanosis, Clubbing or edema, No new Rash or bruise    Data Review:    CBC Recent Labs  Lab 07/13/19 2012 07/14/19 0236 07/15/19 0407 07/16/19 0412 07/17/19 0359  WBC 6.6 4.5 2.6* 4.7 4.8  HGB 15.1* 12.2 10.7* 11.0* 11.1*  HCT 47.3* 39.5 33.5* 34.2* 35.0*  PLT 207 150 143* 176 208  MCV 84.8 87.2  85.7 85.5 87.1  MCH 27.1 26.9 27.4 27.5 27.6  MCHC 31.9 30.9 31.9 32.2 31.7  RDW 14.5 14.6 14.5 14.4 14.3  LYMPHSABS 2.6  --  1.3 2.0 2.9  MONOABS 0.1  --  0.1 0.2 0.3  EOSABS 0.0  --  0.0 0.0 0.0  BASOSABS 0.0  --  0.0 0.0 0.0    Chemistries  Recent Labs  Lab 07/13/19 2012 07/13/19 2012 07/14/19 0236 07/15/19 0407 07/16/19 0412 07/17/19 0359 07/18/19 0249  NA 139   < > 137 139 143 143 140  K 4.1   < > 3.9 3.7 3.8 3.4* 3.3*  CL 104   < > 107 107 109 109 109  CO2 21*   < > 18* 21* 22 25 24   GLUCOSE 123*   < > 100* 124* 111* 85 109*  BUN 19   < > 16 11 11 16 16   CREATININE 1.80*   < > 1.31* 0.92 0.82 0.87 0.85  CALCIUM 9.0   < > 7.8* 8.0* 8.1* 8.3* 8.2*  AST 35  --  31 23 17 15   --   ALT 27  --  23 21 19 18   --   ALKPHOS 52  --  37* 37* 33* 35*  --   BILITOT 0.6  --  0.6 0.6 0.6 0.6  --   MG  --   --   --   --   --   --  2.2  INR 1.0  --   --   --   --   --   --    < > = values in this interval not displayed.     ------------------------------------------------------------------------------------------------------------------ No results for input(s): CHOL, HDL, LDLCALC, TRIG, CHOLHDL, LDLDIRECT in the last 72 hours.  No results found for:  HGBA1C ------------------------------------------------------------------------------------------------------------------ No results for input(s): TSH, T4TOTAL, T3FREE, THYROIDAB in the last 72 hours.  Invalid input(s): FREET3  Cardiac Enzymes No results for input(s): CKMB, TROPONINI, MYOGLOBIN in the last 168 hours.  Invalid input(s): CK ------------------------------------------------------------------------------------------------------------------ No results found for: BNP  Micro Results Recent Results (from the past 240 hour(s))  Blood Culture (routine x 2)     Status: None   Collection Time: 07/13/19  8:11 PM   Specimen: BLOOD  Result Value Ref Range Status   Specimen Description BLOOD BLOOD RIGHT FOREARM  Final   Special Requests   Final    BOTTLES DRAWN AEROBIC AND ANAEROBIC Blood Culture adequate volume   Culture NO GROWTH 5 DAYS  Final   Report Status 07/18/2019 FINAL  Final  Blood Culture (routine x 2)     Status: None   Collection Time: 07/13/19  9:07 PM   Specimen: BLOOD LEFT HAND  Result Value Ref Range Status   Specimen Description BLOOD LEFT HAND  Final   Special Requests   Final    BOTTLES DRAWN AEROBIC AND ANAEROBIC Blood Culture results may not be optimal due to an inadequate volume of blood received in culture bottles   Culture NO GROWTH 5 DAYS  Final   Report Status 07/18/2019 FINAL  Final  SARS CORONAVIRUS 2 (TAT 6-24 HRS) Nasopharyngeal Nasopharyngeal Swab     Status: Abnormal   Collection Time: 07/13/19  9:33 PM   Specimen: Nasopharyngeal Swab  Result Value Ref Range Status   SARS Coronavirus 2 POSITIVE (A) NEGATIVE Final    Comment: RESULT CALLED TO, READ BACK BY AND VERIFIED WITH: RN MEGAN RUGGIERO AT 0500 BY MESSAN HOUEGNIFIO ON 07/14/2019 (NOTE) SARS-CoV-2  target nucleic acids are DETECTED. The SARS-CoV-2 RNA is generally detectable in upper and lower respiratory specimens during the acute phase of infection. Positive results are indicative of  the presence of SARS-CoV-2 RNA. Clinical correlation with patient history and other diagnostic information is  necessary to determine patient infection status. Positive results do not rule out bacterial infection or co-infection with other viruses.  The expected result is Negative. Fact Sheet for Patients: SugarRoll.be Fact Sheet for Healthcare Providers: https://www.woods-mathews.com/ This test is not yet approved or cleared by the [place] FDA and  has been authorized for detection and/or diagnosis of SARS-CoV-2 by FDA under an Emergency Use Authorization (EUA). This EUA will remain  in effect (meaning this t est can be used) for the duration of the COVID-19 declaration under Section 564(b)(1) of the Act, 21 U.S.C. section 360bbb-3(b)(1), unless the authorization is terminated or revoked sooner. Performed at [facility], [place] [address], [place], [place] [address]   Urine culture     Status: None   Collection Time: 07/13/19 11:08 PM   Specimen: In/Out Cath Urine  Result Value Ref Range Status   Specimen Description IN/OUT CATH URINE  Final   Special Requests NONE  Final   Culture   Final    NO GROWTH Performed at [facility], [place] [address], [place], [place] [address]    Report Status 07/15/2019 FINAL  Final  CSF culture     Status: None   Collection Time: 07/14/19  1:08 AM   Specimen: Back; Cerebrospinal Fluid  Result Value Ref Range Status   Specimen Description BACK  Final   Special Requests NONE  Final   Gram Stain   Final    CYTOSPIN SMEAR WBC PRESENT, PREDOMINANTLY MONONUCLEAR NO ORGANISMS SEEN    Culture   Final    NO GROWTH 3 DAYS Performed at [facility], [place] [address] [place], [place] [address]    Report Status 07/17/2019 FINAL  Final    Radiology Reports CT Head Wo Contrast  Result Date: 07/13/2019 CLINICAL DATA:  Seizure, fall EXAM: CT HEAD WITHOUT CONTRAST CT CERVICAL SPINE WITHOUT  CONTRAST TECHNIQUE: Multidetector CT imaging of the head and cervical spine was performed following the standard protocol without intravenous contrast. Multiplanar CT image reconstructions of the cervical spine were also generated. COMPARISON:  None. FINDINGS: CT HEAD FINDINGS Brain: No acute territorial infarction, hemorrhage or intracranial mass. The ventricles are nonenlarged. Vascular: No hyperdense vessel or unexpected calcification. Skull: Normal. Negative for fracture or focal lesion. Sinuses/Orbits: Mucosal thickening in the sinuses. Other: None CT CERVICAL SPINE FINDINGS Alignment: Reversal of cervical lordosis. No subluxation. Facet alignment is normal Skull base and vertebrae: No acute fracture. No primary bone lesion or focal pathologic process. Soft tissues and spinal canal: No prevertebral fluid or swelling. No visible canal hematoma. Disc levels:  Within normal limits Upper chest: Negative. Other: None IMPRESSION: 1. Negative non contrasted CT appearance of the brain 2. Reversal of cervical lordosis.  No acute osseous abnormality. Electronically Signed   By: Donavan Foil M.D.   On: 07/13/2019 21:54   CT Angio Chest PE W/Cm &/Or Wo Cm  Result Date: 07/14/2019 CLINICAL DATA:  Left-sided chest and abdominal pain. History of pulmonary embolism. EXAM: CT ANGIOGRAPHY CHEST CT ABDOMEN AND PELVIS WITH CONTRAST TECHNIQUE: Multidetector CT imaging of the chest was performed using the standard protocol during bolus administration of intravenous contrast. Multiplanar CT image reconstructions and MIPs were obtained to evaluate the vascular anatomy.  Multidetector CT imaging of the abdomen and pelvis was performed using the standard protocol during bolus administration of intravenous contrast. CONTRAST:  64m OMNIPAQUE IOHEXOL 350 MG/ML SOLN COMPARISON:  None. FINDINGS: CTA CHEST FINDINGS Cardiovascular: Pulmonary artery hypertension, the pulmonary trunk measuring 33 mm. No central pulmonary embolism. Patient  respiratory motion precludes evaluation of the more peripheral bilateral pulmonary arteries. No thoracic aorta calcified atherosclerosis or aneurysm. Mediastinum/Nodes: Bulky right hilar and subcarinal lymph nodes which are noncalcified and measure up to 18 mm diameter. Normal thyroid and decompressed esophagus. Lungs/Pleura: Patchy alveolar and airspace disease throughout the right lung and most confluent in the right middle lobe with air bronchograms. A couple small patchy ground-glass opacities in the left base. No pleural effusion or pneumothorax. A 4 mm pleural based sub solid nodule along the left major fissure, possibly a reactive subpleural lymph node. Musculoskeletal: Normal. Review of the MIP images confirms the above findings. CT ABDOMEN and PELVIS FINDINGS Hepatobiliary: Fatty infiltration of the hepatic parenchyma. Normal gallbladder and biliary tree. Pancreas: Normal. Spleen: Normal. Adrenals/Urinary Tract: Normal. Stomach/Bowel: Normal appendix, axial series 5, image 63. No mucosal thickening or obstruction. Vascular/Lymphatic: A number of small noncalcified lymph nodes at the mesenteric root, likely reactive, measuring up to 17 by 15 mm. No abdominal aortic aneurysm, dissection or calcified atherosclerosis. Patent portal and hepatic veins. 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 Reproductive: No apparent abnormality. Other: No retroperitoneal hematoma, free intraperitoneal fluid or air. Musculoskeletal: Normal. The Review of the MIP images confirms the above findings. IMPRESSION: No central pulmonary embolism. Respiratory motion artifact precludes exclusion of a peripheral pulmonary embolism. Patchy alveolar and airspace disease in the right lung, most confluent in the right middle lobe, likely pneumonia. A couple small focal alveolar opacities in the left base, also probably infectious. Unenhanced CT chest recommended in 3 months to document  resolution. Bulky right hilar and subcarinal lymph nodes, likely reactive. Pulmonary artery hypertension. Hepatic steatosis. A number of small noncalcified lymph nodes at the mesenteric root, likely a reactive adenitis. Electronically Signed   By: RRevonda Humphrey  On: 07/14/2019 00:14   CT Cervical Spine Wo Contrast  Result Date: 07/13/2019 CLINICAL DATA:  Seizure, fall EXAM: CT HEAD WITHOUT CONTRAST CT CERVICAL SPINE WITHOUT CONTRAST TECHNIQUE: Multidetector CT imaging of the head and cervical spine was performed following the standard protocol without intravenous contrast. Multiplanar CT image reconstructions of the cervical spine were also generated. COMPARISON:  None. FINDINGS: CT HEAD FINDINGS Brain: No acute territorial infarction, hemorrhage or intracranial mass. The ventricles are nonenlarged. Vascular: No hyperdense vessel or unexpected calcification. Skull: Normal. Negative for fracture or focal lesion. Sinuses/Orbits: Mucosal thickening in the sinuses. Other: None CT CERVICAL SPINE FINDINGS Alignment: Reversal of cervical lordosis. No subluxation. Facet alignment is normal Skull base and vertebrae: No acute fracture. No primary bone lesion or focal pathologic process. Soft tissues and spinal canal: No prevertebral fluid or swelling. No visible canal hematoma. Disc levels:  Within normal limits Upper chest: Negative. Other: None IMPRESSION: 1. Negative non contrasted CT appearance of the brain 2. Reversal of cervical lordosis.  No acute osseous abnormality. Electronically Signed   By: KDonavan FoilM.D.   On: 07/13/2019 21:54   MR BRAIN WO CONTRAST  Result Date: 07/14/2019 CLINICAL DATA:  Encephalopathy. Additional history provided: Flu like symptoms, seizure-like activity EXAM: MRI HEAD WITHOUT CONTRAST TECHNIQUE: Multiplanar, multiecho pulse sequences of the brain and surrounding structures were obtained without intravenous contrast.  COMPARISON:  CT head 07/13/2019 FINDINGS: Brain: There is no  evidence of acute infarct. No evidence of intracranial mass. No midline shift or extra-axial fluid collection. No chronic intracranial blood products. There is a single punctate focus of T2/FLAIR hyperintensity within the right frontal lobe white matter which is nonspecific and of doubtful clinical significance (series 12, image 16). No other focal parenchymal signal abnormality is identified. The hippocampi are symmetric in size and signal. Cerebral volume is normal for age. Partially empty sella turcica. Mildly low-lying cerebellar tonsils extending 3 mm below the level of the foramen magnum. Vascular: Flow voids maintained within the proximal large arterial vessels. Skull and upper cervical spine: No focal marrow lesion. Sinuses/Orbits: Visualized orbits demonstrate no acute abnormality. Moderate bilateral sphenoid sinus mucosal thickening. Mild mucosal thickening within the remainder of the paranasal sinuses. Small bilateral mastoid effusions. IMPRESSION: 1. No evidence of acute intracranial abnormality. 2. No specific seizure focus is identified. 3. Mildly low-lying cerebellar tonsils extending 3 mm, likely developmental. 4. Paranasal sinus mucosal thickening greatest within the bilateral sphenoid sinuses. 5. Small bilateral mastoid effusions. Electronically Signed   By: Kellie Simmering DO   On: 07/14/2019 10:50   CT Abdomen Pelvis W Contrast  Result Date: 07/14/2019 CLINICAL DATA:  Left-sided chest and abdominal pain. History of pulmonary embolism. EXAM: CT ANGIOGRAPHY CHEST CT ABDOMEN AND PELVIS WITH CONTRAST TECHNIQUE: Multidetector CT imaging of the chest was performed using the standard protocol during bolus administration of intravenous contrast. Multiplanar CT image reconstructions and MIPs were obtained to evaluate the vascular anatomy. Multidetector CT imaging of the abdomen and pelvis was performed using the standard protocol during bolus administration of intravenous contrast. CONTRAST:  62m  OMNIPAQUE IOHEXOL 350 MG/ML SOLN COMPARISON:  None. FINDINGS: CTA CHEST FINDINGS Cardiovascular: Pulmonary artery hypertension, the pulmonary trunk measuring 33 mm. No central pulmonary embolism. Patient respiratory motion precludes evaluation of the more peripheral bilateral pulmonary arteries. No thoracic aorta calcified atherosclerosis or aneurysm. Mediastinum/Nodes: Bulky right hilar and subcarinal lymph nodes which are noncalcified and measure up to 18 mm diameter. Normal thyroid and decompressed esophagus. Lungs/Pleura: Patchy alveolar and airspace disease throughout the right lung and most confluent in the right middle lobe with air bronchograms. A couple small patchy ground-glass opacities in the left base. No pleural effusion or pneumothorax. A 4 mm pleural based sub solid nodule along the left major fissure, possibly a reactive subpleural lymph node. Musculoskeletal: Normal. Review of the MIP images confirms the above findings. CT ABDOMEN and PELVIS FINDINGS Hepatobiliary: Fatty infiltration of the hepatic parenchyma. Normal gallbladder and biliary tree. Pancreas: Normal. Spleen: Normal. Adrenals/Urinary Tract: Normal. Stomach/Bowel: Normal appendix, axial series 5, image 63. No mucosal thickening or obstruction. Vascular/Lymphatic: A number of small noncalcified lymph nodes at the mesenteric root, likely reactive, measuring up to 17 by 15 mm. No abdominal aortic aneurysm, dissection or calcified atherosclerosis. Patent portal and hepatic veins. 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 Reproductive: No apparent abnormality. Other: No retroperitoneal hematoma, free intraperitoneal fluid or air. Musculoskeletal: Normal. The Review of the MIP images confirms the above findings. IMPRESSION: No central pulmonary embolism. Respiratory motion artifact precludes exclusion of a peripheral pulmonary embolism. Patchy alveolar and airspace disease in the right lung,  most confluent in the right middle lobe, likely pneumonia. A couple small focal alveolar opacities in the left base, also probably infectious. Unenhanced CT chest recommended in 3 months to document resolution. Bulky right hilar and subcarinal lymph nodes,  likely reactive. Pulmonary artery hypertension. Hepatic steatosis. A number of small noncalcified lymph nodes at the mesenteric root, likely a reactive adenitis. Electronically Signed   By: Revonda Humphrey   On: 07/14/2019 00:14   DG Chest Port 1 View  Result Date: 07/13/2019 CLINICAL DATA:  Fever.  Altered mental status. EXAM: PORTABLE CHEST 1 VIEW COMPARISON:  None. FINDINGS: Cardiac silhouette normal in size. No mediastinal or hilar masses or evidence of adenopathy. Mild linear/interstitial type opacities right lower lung, which may reflect atelectasis. Lungs otherwise clear. No pleural effusion or pneumothorax. Skeletal structures are intact. IMPRESSION: 1. Mild area linear/interstitial opacity at the right lung base, most likely atelectasis and crowding due to low lung volumes and patient rotation. A small focus pneumonia is possible. No other abnormality. Electronically Signed   By: Lajean Manes M.D.   On: 07/13/2019 19:34   EEG adult  Result Date: 07/14/2019 Lora Havens, MD     07/14/2019  4:11 PM Patient Name: Gabriella Montgomery MRN: [id number] Epilepsy Attending: Lora Havens Referring Physician/Provider: Dr. Lala Lund Date: 07/14/2019 Duration: 25.03 mins Patient history: 34 year old female, found to be Covid positive also had 2 episodes of seizure-like activity.  EEG to evaluate for seizures. Level of alertness: Awake, asleep AEDs during EEG study: None Technical aspects: This EEG study was done with scalp electrodes positioned according to the 10-20 International system of electrode placement. Electrical activity was acquired at a sampling rate of 500Hz  and reviewed with a high frequency filter of 70Hz  and a low frequency filter of  1Hz . EEG data were recorded continuously and digitally stored. Description: The posterior dominant rhythm consists of 9 Hz activity of moderate voltage (25-35 uV) seen predominantly in posterior head regions, symmetric and reactive to eye opening and eye closing.  Sleep was characterized by vertex waves, sleep spindles (12 to 14 Hz), maximal frontocentral.  Hyperventilation and photic stimulation were not performed. IMPRESSION: This study is within normal limits. No seizures or epileptiform discharges were seen throughout the recording. Priyanka Barbra Sarks

## 2019-07-18 NOTE — Plan of Care (Signed)
Chart Review Note  No overnight seizures  Recs: Continue Keppra 750 BID No need for LTM F/U OP neurology in 6-8 weeks.  -- Milon Dikes, MD [facility]

## 2019-07-19 DIAGNOSIS — U071 COVID-19: Secondary | ICD-10-CM

## 2019-07-19 DIAGNOSIS — J1282 Pneumonia due to coronavirus disease 2019: Secondary | ICD-10-CM

## 2019-07-19 LAB — BASIC METABOLIC PANEL
Anion gap: 10 (ref 5–15)
BUN: 15 mg/dL (ref 6–20)
CO2: 22 mmol/L (ref 22–32)
Calcium: 8.5 mg/dL — ABNORMAL LOW (ref 8.9–10.3)
Chloride: 107 mmol/L (ref 98–111)
Creatinine, Ser: 0.9 mg/dL (ref 0.44–1.00)
GFR calc Af Amer: >60 mL/min (ref >60)
GFR calc non Af Amer: >60 mL/min (ref >60)
Glucose, Bld: 98 mg/dL (ref 70–99)
Potassium: 3.8 mmol/L (ref 3.5–5.1)
Sodium: 139 mmol/L (ref 135–145)

## 2019-07-19 MED ORDER — LEVETIRACETAM 750 MG PO TABS: 750.0000 mg | ORAL_TABLET | Freq: Two times a day (BID) | ORAL | 0 refills | Status: DC

## 2019-07-19 NOTE — Care Management (Signed)
Pt deemed stable for discharge home today.  Pt confirms she is independent from home with mom.  Pt has active medicaid and denied barriers with paying for prescription medications.  Pt denied barriers with discharging back to home setting.  CM signing off

## 2019-07-19 NOTE — Plan of Care (Signed)
  Problem: Clinical Measurements: Goal: Respiratory complications will improve Outcome: Progressing   

## 2019-07-19 NOTE — Discharge Summary (Signed)
Physician Discharge Summary  Gabriella Montgomery WUJ:[id number] DOB: May 17, 1985 DOA: 07/13/2019  PCP: Patient, No Pcp Per  Admit date: 07/13/2019 Discharge date: 07/19/2019  Admitted From: Home  Disposition:  Home   Recommendations for Outpatient Follow-up and new medication changes:  1. Follow up with Primary care in 2 weeks.  2. Continue self quarantine for 2 weeks, maintain physical distancing and use a mask in public.  3. Patient started on Keppra 750 mg bid for new onset seizures. 4. Follow with outpatient neurology.   Home Health: no   Equipment/Devices: nn    Discharge Condition: stable  CODE STATUS: full  Diet recommendation: regular.   Brief/Interim Summary: Patient was admitted to the hospital with a working diagnosis of acute hypoxic respiratory failure due to SARS COVID-19 viral pneumonia, complicated by new onset generalized seizures  34 year old female who presented to the hospital with seizure-like activity.  She does have significant past medical history of pulmonary embolism while pregnant.  Patient reported 3 days of flulike symptoms including cough, fever, headaches and body aches.  Positive sick contacts at home.  On her way to the hospital she had seizure activity, received Versed per EMS.  On arrival to the hospital she was confused but able to follow commands and answer questions, her temperature was 102.9, heart rate 139, pressure 124/72, respiratory rate 20, oxygen saturation 94% on supplemental oxygen per nasal cannula.  Her lungs are clear to auscultation bilaterally, heart S1-S2 present rhythm, soft abdomen, no lower extremity edema, no neck stiffness. Sodium 137, potassium 3.9, chloride 107, bicarb 18, glucose 100, BUN 16, creatinine 1.31, white count 4.5, hemoglobin 12.2, hematocrit 39.5, platelets 150.  SARS COVID-19 was positive, urinalysis more than 300 protein, specific gravity 1.030.  CSF fluid with 67 glucose, 7 white cells, 25 protein.  Drug screen positive for  benzodiazepines, alcohol less than 10.  Head cervical CT negative for acute changes.  Chest x-ray with interstitial infiltrate at the right lower lobe.  CT chest with alveolar infiltrates on the right lung, right upper lobe, right middle lobe and right lower lobe.  Negative for pulmonary embolism, abdominal CT no acute changes.  EKG 140 bpm, normal axis, normal intervals, sinus rhythm, no significant ST segment or T wave changes.  Patient received Keppra for seizure control, she had further seizure activity in the hospital, last one on March 21.  Further work-up with brain MRI no acute changes.  For her cough pneumonia she received remdesivir and systemic steroids with good response.  1.  Acute hypoxic respiratory failure due to SARS COVID-19 viral pneumonia.  Patient was admitted to the medical ward, she received supplemental oxygen per nasal cannula, antiviral therapy with remdesivir and systemic corticosteroids with dexamethasone.  Suspicion for bacterial coinfection, received antibiotic therapy with ceftriaxone and azithromycin.  Patient had improvement of her symptoms, along with her oxygenation.  At discharge her oximetry is 95 to 97% on room air.  2.  New onset seizures.  Patient likely has a seizure predisposition which was unmasked by general inflammatory state related to SARS COVID-19 infection.  Further work-up with brain MRI and lumbar puncture revealed no structural abnormalities or infectious process.  Patient last seizure activity witnessed March 21.  She will continue taking Keppra 750 g twice daily and follow-up with neurology as an outpatient.  3.  Acute kidney injury with metabolic acidosis/hypokalemia.  Patient received IV fluids with improvement of kidney function.  Potassium was corrected with potassium chloride.  4.  Leukopenia.  Likely viral  related, clinically improved.  Patient ruled out for sepsis.   Discharge Diagnoses:  Principal Problem:   Acute hypoxemic  respiratory failure due to COVID-19 [facility]) Active Problems:   CAP (community acquired pneumonia)   Seizure-like activity (HCC)   AKI (acute kidney injury) (HCC)   Pneumonia due to COVID-19 virus     Discharge Instructions   Allergies as of 07/19/2019      Reactions   Azithromycin Anaphylaxis   Amoxicillin Hives   Has patient had a PCN reaction causing immediate rash, facial/tongue/throat swelling, SOB or lightheadedness with hypotension: No Has patient had a PCN reaction causing severe rash involving mucus membranes or skin necrosis: YEs Has patient had a PCN reaction that required hospitalization: No Has patient had a PCN reaction occurring within the last 10 years: No If all of the above answers are "NO", then may proceed with Cephalosporin use.   Clindamycin Hives   Ibuprofen Hives   Tramadol Hives      Medication List    TAKE these medications   acetaminophen 325 MG tablet Commonly known as: Tylenol Take 2 tablets (650 mg total) by mouth every 6 (six) hours as needed. What changed: reasons to take this   levETIRAcetam 750 MG tablet Commonly known as: KEPPRA Take 1 tablet (750 mg total) by mouth 2 (two) times daily.   THERAFLU COLD & COUGH PO Take 5 mLs by mouth daily as needed (For cold and flu symptoms).      Follow-up Information     [facility]. Schedule an appointment as soon as possible for a visit in 1 week(s).   Contact information: [address] [place] [address] [phone]       Suanne Marker, MD. Schedule an appointment as soon as possible for a visit in 2 week(s).   Specialties: Neurology, Radiology Why: ? seizures Contact information: [address] [place] [address] [phone]          Allergies  Allergen Reactions  . Azithromycin Anaphylaxis  . Amoxicillin Hives    Has patient had a PCN reaction causing immediate rash, facial/tongue/throat swelling, SOB  or lightheadedness with hypotension: No Has patient had a PCN reaction causing severe rash involving mucus membranes or skin necrosis: YEs Has patient had a PCN reaction that required hospitalization: No Has patient had a PCN reaction occurring within the last 10 years: No If all of the above answers are "NO", then may proceed with Cephalosporin use.  . Clindamycin Hives  . Ibuprofen Hives  . Tramadol Hives    Consultations:  Neurology   Procedures/Studies: CT Head Wo Contrast  Result Date: 07/13/2019 CLINICAL DATA:  Seizure, fall EXAM: CT HEAD WITHOUT CONTRAST CT CERVICAL SPINE WITHOUT CONTRAST TECHNIQUE: Multidetector CT imaging of the head and cervical spine was performed following the standard protocol without intravenous contrast. Multiplanar CT image reconstructions of the cervical spine were also generated. COMPARISON:  None. FINDINGS: CT HEAD FINDINGS Brain: No acute territorial infarction, hemorrhage or intracranial mass. The ventricles are nonenlarged. Vascular: No hyperdense vessel or unexpected calcification. Skull: Normal. Negative for fracture or focal lesion. Sinuses/Orbits: Mucosal thickening in the sinuses. Other: None CT CERVICAL SPINE FINDINGS Alignment: Reversal of cervical lordosis. No subluxation. Facet alignment is normal Skull base and vertebrae: No acute fracture. No primary bone lesion or focal pathologic process. Soft tissues and spinal canal: No prevertebral fluid or swelling. No visible canal hematoma. Disc levels:  Within normal limits Upper chest: Negative. Other: None  IMPRESSION: 1. Negative non contrasted CT appearance of the brain 2. Reversal of cervical lordosis.  No acute osseous abnormality. Electronically Signed   By: Jasmine Pang M.D.   On: 07/13/2019 21:54   CT Angio Chest PE W/Cm &/Or Wo Cm  Result Date: 07/14/2019 CLINICAL DATA:  Left-sided chest and abdominal pain. History of pulmonary embolism. EXAM: CT ANGIOGRAPHY CHEST CT ABDOMEN AND PELVIS WITH  CONTRAST TECHNIQUE: Multidetector CT imaging of the chest was performed using the standard protocol during bolus administration of intravenous contrast. Multiplanar CT image reconstructions and MIPs were obtained to evaluate the vascular anatomy. Multidetector CT imaging of the abdomen and pelvis was performed using the standard protocol during bolus administration of intravenous contrast. CONTRAST:  80mL OMNIPAQUE IOHEXOL 350 MG/ML SOLN COMPARISON:  None. FINDINGS: CTA CHEST FINDINGS Cardiovascular: Pulmonary artery hypertension, the pulmonary trunk measuring 33 mm. No central pulmonary embolism. Patient respiratory motion precludes evaluation of the more peripheral bilateral pulmonary arteries. No thoracic aorta calcified atherosclerosis or aneurysm. Mediastinum/Nodes: Bulky right hilar and subcarinal lymph nodes which are noncalcified and measure up to 18 mm diameter. Normal thyroid and decompressed esophagus. Lungs/Pleura: Patchy alveolar and airspace disease throughout the right lung and most confluent in the right middle lobe with air bronchograms. A couple small patchy ground-glass opacities in the left base. No pleural effusion or pneumothorax. A 4 mm pleural based sub solid nodule along the left major fissure, possibly a reactive subpleural lymph node. Musculoskeletal: Normal. Review of the MIP images confirms the above findings. CT ABDOMEN and PELVIS FINDINGS Hepatobiliary: Fatty infiltration of the hepatic parenchyma. Normal gallbladder and biliary tree. Pancreas: Normal. Spleen: Normal. Adrenals/Urinary Tract: Normal. Stomach/Bowel: Normal appendix, axial series 5, image 63. No mucosal thickening or obstruction. Vascular/Lymphatic: A number of small noncalcified lymph nodes at the mesenteric root, likely reactive, measuring up to 17 by 15 mm. No abdominal aortic aneurysm, dissection or calcified atherosclerosis. Patent portal and hepatic veins. 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 Reproductive: No apparent abnormality. Other: No retroperitoneal hematoma, free intraperitoneal fluid or air. Musculoskeletal: Normal. The Review of the MIP images confirms the above findings. IMPRESSION: No central pulmonary embolism. Respiratory motion artifact precludes exclusion of a peripheral pulmonary embolism. Patchy alveolar and airspace disease in the right lung, most confluent in the right middle lobe, likely pneumonia. A couple small focal alveolar opacities in the left base, also probably infectious. Unenhanced CT chest recommended in 3 months to document resolution. Bulky right hilar and subcarinal lymph nodes, likely reactive. Pulmonary artery hypertension. Hepatic steatosis. A number of small noncalcified lymph nodes at the mesenteric root, likely a reactive adenitis. Electronically Signed   By: Laurence Ferrari   On: 07/14/2019 00:14   CT Cervical Spine Wo Contrast  Result Date: 07/13/2019 CLINICAL DATA:  Seizure, fall EXAM: CT HEAD WITHOUT CONTRAST CT CERVICAL SPINE WITHOUT CONTRAST TECHNIQUE: Multidetector CT imaging of the head and cervical spine was performed following the standard protocol without intravenous contrast. Multiplanar CT image reconstructions of the cervical spine were also generated. COMPARISON:  None. FINDINGS: CT HEAD FINDINGS Brain: No acute territorial infarction, hemorrhage or intracranial mass. The ventricles are nonenlarged. Vascular: No hyperdense vessel or unexpected calcification. Skull: Normal. Negative for fracture or focal lesion. Sinuses/Orbits: Mucosal thickening in the sinuses. Other: None CT CERVICAL SPINE FINDINGS Alignment: Reversal of cervical lordosis. No subluxation. Facet alignment is normal Skull base and vertebrae: No acute fracture. No primary bone lesion or focal pathologic process.  Soft tissues and spinal canal: No prevertebral fluid or swelling. No visible canal hematoma. Disc levels:  Within normal limits Upper chest:  Negative. Other: None IMPRESSION: 1. Negative non contrasted CT appearance of the brain 2. Reversal of cervical lordosis.  No acute osseous abnormality. Electronically Signed   By: Jasmine Pang M.D.   On: 07/13/2019 21:54   MR BRAIN WO CONTRAST  Result Date: 07/14/2019 CLINICAL DATA:  Encephalopathy. Additional history provided: Flu like symptoms, seizure-like activity EXAM: MRI HEAD WITHOUT CONTRAST TECHNIQUE: Multiplanar, multiecho pulse sequences of the brain and surrounding structures were obtained without intravenous contrast. COMPARISON:  CT head 07/13/2019 FINDINGS: Brain: There is no evidence of acute infarct. No evidence of intracranial mass. No midline shift or extra-axial fluid collection. No chronic intracranial blood products. There is a single punctate focus of T2/FLAIR hyperintensity within the right frontal lobe white matter which is nonspecific and of doubtful clinical significance (series 12, image 16). No other focal parenchymal signal abnormality is identified. The hippocampi are symmetric in size and signal. Cerebral volume is normal for age. Partially empty sella turcica. Mildly low-lying cerebellar tonsils extending 3 mm below the level of the foramen magnum. Vascular: Flow voids maintained within the proximal large arterial vessels. Skull and upper cervical spine: No focal marrow lesion. Sinuses/Orbits: Visualized orbits demonstrate no acute abnormality. Moderate bilateral sphenoid sinus mucosal thickening. Mild mucosal thickening within the remainder of the paranasal sinuses. Small bilateral mastoid effusions. IMPRESSION: 1. No evidence of acute intracranial abnormality. 2. No specific seizure focus is identified. 3. Mildly low-lying cerebellar tonsils extending 3 mm, likely developmental. 4. Paranasal sinus mucosal thickening greatest within the bilateral sphenoid sinuses. 5. Small bilateral mastoid effusions. Electronically Signed   By: Jackey Loge DO   On: 07/14/2019 10:50   CT  Abdomen Pelvis W Contrast  Result Date: 07/14/2019 CLINICAL DATA:  Left-sided chest and abdominal pain. History of pulmonary embolism. EXAM: CT ANGIOGRAPHY CHEST CT ABDOMEN AND PELVIS WITH CONTRAST TECHNIQUE: Multidetector CT imaging of the chest was performed using the standard protocol during bolus administration of intravenous contrast. Multiplanar CT image reconstructions and MIPs were obtained to evaluate the vascular anatomy. Multidetector CT imaging of the abdomen and pelvis was performed using the standard protocol during bolus administration of intravenous contrast. CONTRAST:  55mL OMNIPAQUE IOHEXOL 350 MG/ML SOLN COMPARISON:  None. FINDINGS: CTA CHEST FINDINGS Cardiovascular: Pulmonary artery hypertension, the pulmonary trunk measuring 33 mm. No central pulmonary embolism. Patient respiratory motion precludes evaluation of the more peripheral bilateral pulmonary arteries. No thoracic aorta calcified atherosclerosis or aneurysm. Mediastinum/Nodes: Bulky right hilar and subcarinal lymph nodes which are noncalcified and measure up to 18 mm diameter. Normal thyroid and decompressed esophagus. Lungs/Pleura: Patchy alveolar and airspace disease throughout the right lung and most confluent in the right middle lobe with air bronchograms. A couple small patchy ground-glass opacities in the left base. No pleural effusion or pneumothorax. A 4 mm pleural based sub solid nodule along the left major fissure, possibly a reactive subpleural lymph node. Musculoskeletal: Normal. Review of the MIP images confirms the above findings. CT ABDOMEN and PELVIS FINDINGS Hepatobiliary: Fatty infiltration of the hepatic parenchyma. Normal gallbladder and biliary tree. Pancreas: Normal. Spleen: Normal. Adrenals/Urinary Tract: Normal. Stomach/Bowel: Normal appendix, axial series 5, image 63. No mucosal thickening or obstruction. Vascular/Lymphatic: A number of small noncalcified lymph nodes at the mesenteric root, likely reactive,  measuring up to 17 by 15 mm. No abdominal aortic aneurysm, dissection or calcified atherosclerosis. Patent portal and hepatic veins. 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 Reproductive: No apparent abnormality. Other: No retroperitoneal hematoma, free intraperitoneal fluid or air. Musculoskeletal: Normal. The Review of the MIP images confirms the above findings. IMPRESSION: No central pulmonary embolism. Respiratory motion artifact precludes exclusion of a peripheral pulmonary embolism. Patchy alveolar and airspace disease in the right lung, most confluent in the right middle lobe, likely pneumonia. A couple small focal alveolar opacities in the left base, also probably infectious. Unenhanced CT chest recommended in 3 months to document resolution. Bulky right hilar and subcarinal lymph nodes, likely reactive. Pulmonary artery hypertension. Hepatic steatosis. A number of small noncalcified lymph nodes at the mesenteric root, likely a reactive adenitis. Electronically Signed   By: Laurence Ferrari   On: 07/14/2019 00:14   DG Chest Port 1 View  Result Date: 07/13/2019 CLINICAL DATA:  Fever.  Altered mental status. EXAM: PORTABLE CHEST 1 VIEW COMPARISON:  None. FINDINGS: Cardiac silhouette normal in size. No mediastinal or hilar masses or evidence of adenopathy. Mild linear/interstitial type opacities right lower lung, which may reflect atelectasis. Lungs otherwise clear. No pleural effusion or pneumothorax. Skeletal structures are intact. IMPRESSION: 1. Mild area linear/interstitial opacity at the right lung base, most likely atelectasis and crowding due to low lung volumes and patient rotation. A small focus pneumonia is possible. No other abnormality. Electronically Signed   By: Amie Portland M.D.   On: 07/13/2019 19:34   EEG adult  Result Date: 07/14/2019 Charlsie Quest, MD     07/14/2019  4:11 PM Patient Name: EDDY TERMINE MRN: [id number] Epilepsy  Attending: Charlsie Quest Referring Physician/Provider: Dr. Susa Raring Date: 07/14/2019 Duration: 25.03 mins Patient history: 34 year old female, found to be Covid positive also had 2 episodes of seizure-like activity.  EEG to evaluate for seizures. Level of alertness: Awake, asleep AEDs during EEG study: None Technical aspects: This EEG study was done with scalp electrodes positioned according to the 10-20 International system of electrode placement. Electrical activity was acquired at a sampling rate of  and reviewed with a high frequency filter of  and a low frequency filter of . EEG data were recorded continuously and digitally stored. Description: The posterior dominant rhythm consists of 9 Hz activity of moderate voltage (25-35 uV) seen predominantly in posterior head regions, symmetric and reactive to eye opening and eye closing.  Sleep was characterized by vertex waves, sleep spindles (12 to 14 Hz), maximal frontocentral.  Hyperventilation and photic stimulation were not performed. IMPRESSION: This study is within normal limits. No seizures or epileptiform discharges were seen throughout the recording. Priyanka Annabelle Harman       Subjective: Patient is feeling well, no dyspnea or chest pain, no nausea or vomiting.   Discharge Exam: Vitals:   07/19/19 0819 07/19/19 0821  BP:  115/68  Pulse:  72  Resp:  20  Temp:  98.5 F (36.9 C)  SpO2: 97% 95%   Vitals:   07/18/19 1941 07/19/19 0644 07/19/19 0819 07/19/19 0821  BP: 126/88 (!) 126/96  115/68  Pulse:  (!) 46  72  Resp: Temp:  97.9 F (36.6 C)  98.5 F (36.9 C)  TempSrc:  Oral  Oral  SpO2:  98% 97% 95%  Weight:      Height:        General: Not in pain or dyspnea. Neurology: Awake and alert, non focal  E ENT: no pallor, no icterus, oral mucosa moist Cardiovascular:  No JVD. S1-S2 present, rhythmic, no gallops, rubs, or murmurs. No lower extremity edema. Pulmonary: positive breath sounds bilaterally,  adequate air movement, no wheezing, rhonchi or rales. Gastrointestinal. Abdomen with no organomegaly, non tender, no rebound or guarding Skin. No rashes Musculoskeletal: no joint deformities   The results of significant diagnostics from this hospitalization (including imaging, microbiology, ancillary and laboratory) are listed below for reference.     Microbiology: Recent Results (from the past 240 hour(s))  Blood Culture (routine x 2)     Status: None   Collection Time: 07/13/19  8:11 PM   Specimen: BLOOD  Result Value Ref Range Status   Specimen Description BLOOD BLOOD RIGHT FOREARM  Final   Special Requests   Final    BOTTLES DRAWN AEROBIC AND ANAEROBIC Blood Culture adequate volume   Culture NO GROWTH 5 DAYS  Final   Report Status 07/18/2019 FINAL  Final  Blood Culture (routine x 2)     Status: None   Collection Time: 07/13/19  9:07 PM   Specimen: BLOOD LEFT HAND  Result Value Ref Range Status   Specimen Description BLOOD LEFT HAND  Final   Special Requests   Final    BOTTLES DRAWN AEROBIC AND ANAEROBIC Blood Culture results may not be optimal due to an inadequate volume of blood received in culture bottles   Culture NO GROWTH 5 DAYS  Final   Report Status 07/18/2019 FINAL  Final  SARS CORONAVIRUS 2 (TAT 6-24 HRS) Nasopharyngeal Nasopharyngeal Swab     Status: Abnormal   Collection Time: 07/13/19  9:33 PM   Specimen: Nasopharyngeal Swab  Result Value Ref Range Status   SARS Coronavirus 2 POSITIVE (A) NEGATIVE Final    Comment: RESULT CALLED TO, READ BACK BY AND VERIFIED WITH: RN MEGAN RUGGIERO AT 0500 BY MESSAN HOUEGNIFIO ON 07/14/2019 (NOTE) SARS-CoV-2 target nucleic acids are DETECTED. The SARS-CoV-2 RNA is generally detectable in upper and lower respiratory specimens during the acute phase of infection. Positive results are indicative of the presence of SARS-CoV-2 RNA. Clinical correlation with patient history and other diagnostic information is  necessary to determine  patient infection status. Positive results do not rule out bacterial infection or co-infection with other viruses.  The expected result is Negative. Fact Sheet for Patients: HairSlick.no Fact Sheet for Healthcare Providers: quierodirigir.com This test is not yet approved or cleared by the [place] FDA and  has been authorized for detection and/or diagnosis of SARS-CoV-2 by FDA under an Emergency Use Authorization (EUA). This EUA will remain  in effect (meaning this t est can be used) for the duration of the COVID-19 declaration under Section 564(b)(1) of the Act, 21 U.S.C. section 360bbb-3(b)(1), unless the authorization is terminated or revoked sooner. Performed at [facility], [address] [place], [place] [address]   Urine culture     Status: None   Collection Time: 07/13/19 11:08 PM   Specimen: In/Out Cath Urine  Result Value Ref Range Status   Specimen Description IN/OUT CATH URINE  Final   Special Requests NONE  Final   Culture   Final    NO GROWTH Performed at [facility], [address] [place], [place] [address]    Report Status 07/15/2019 FINAL  Final  CSF culture     Status: None   Collection Time: 07/14/19  1:08 AM   Specimen: Back; Cerebrospinal Fluid  Result Value Ref Range Status   Specimen Description BACK  Final   Special Requests NONE  Final   Gram Stain   Final    CYTOSPIN SMEAR WBC PRESENT, PREDOMINANTLY MONONUCLEAR NO ORGANISMS SEEN    Culture   Final    NO GROWTH 3 DAYS Performed at [facility], [address] [place], [place] [address]    Report Status 07/17/2019 FINAL  Final     Labs: BNP (last 3 results) No results for input(s): BNP in the last 8760 hours. Basic Metabolic Panel: Recent Labs  Lab 07/15/19 0407 07/16/19 0412 07/17/19 0359 07/18/19 0249 07/19/19 0426  NA 139 143 143 140 139  K 3.7 3.8 3.4* 3.3* 3.8  CL 107 109 109 109 107  CO2 21* 22 25  24 22   GLUCOSE 124* 111* 85 109* 98  BUN 11 11 16 16 15   CREATININE 0.92 0.82 0.87 0.85 0.90  CALCIUM 8.0* 8.1* 8.3* 8.2* 8.5*  MG  --   --   --  2.2  --    Liver Function Tests: Recent Labs  Lab 07/13/19 2012 07/14/19 0236 07/15/19 0407 07/16/19 0412 07/17/19 0359  AST 35 31 23 17 15   ALT 27 23 21 19 18   ALKPHOS 52 37* 37* 33* 35*  BILITOT 0.6 0.6 0.6 0.6 0.6  PROT 8.5* 6.4* 6.4* 6.0* 6.4*  ALBUMIN 3.8 3.0* 2.8* 2.7* 2.8*   No results for input(s): LIPASE, AMYLASE in the last 168 hours. No results for input(s): AMMONIA in the last 168 hours. CBC: Recent Labs  Lab 07/13/19 2012 07/14/19 0236 07/15/19 0407 07/16/19 0412 07/17/19 0359  WBC 6.6 4.5 2.6* 4.7 4.8  NEUTROABS 3.9  --  1.2* 2.5 1.7  HGB 15.1* 12.2 10.7* 11.0* 11.1*  HCT 47.3* 39.5 33.5* 34.2* 35.0*  MCV 84.8 87.2 85.7 85.5 87.1  PLT 207 150 143* 176 208   Cardiac Enzymes: No results for input(s): CKTOTAL, CKMB, CKMBINDEX, TROPONINI in the last 168 hours. BNP: Invalid input(s): POCBNP CBG: Recent Labs  Lab 07/13/19 2047  GLUCAP 98   D-Dimer Recent Labs    07/17/19 0359  DDIMER 1.00*   Hgb A1c No results for input(s): HGBA1C in the last 72 hours. Lipid Profile No results for input(s): CHOL, HDL, LDLCALC, TRIG, CHOLHDL, LDLDIRECT in the last 72 hours. Thyroid function studies No results for input(s): TSH, T4TOTAL, T3FREE, THYROIDAB in the last 72 hours.  Invalid input(s): FREET3 Anemia work up No results for input(s): VITAMINB12, FOLATE, FERRITIN, TIBC, IRON, RETICCTPCT in the last 72 hours. Urinalysis    Component Value Date/Time   COLORURINE AMBER (A) 07/13/2019 2317   APPEARANCEUR HAZY (A) 07/13/2019 2317   LABSPEC 1.030 07/13/2019 2317   PHURINE 5.0 07/13/2019 2317   GLUCOSEU NEGATIVE 07/13/2019 2317   HGBUR NEGATIVE 07/13/2019 2317   BILIRUBINUR NEGATIVE 07/13/2019 2317   KETONESUR NEGATIVE 07/13/2019 2317   PROTEINUR >=300 (A) 07/13/2019 2317   NITRITE NEGATIVE 07/13/2019 2317    LEUKOCYTESUR NEGATIVE 07/13/2019 2317   Sepsis Labs Invalid input(s): PROCALCITONIN,  WBC,  LACTICIDVEN Microbiology Recent Results (from the past 240 hour(s))  Blood Culture (routine x 2)     Status: None   Collection Time: 07/13/19  8:11 PM   Specimen: BLOOD  Result Value Ref Range Status   Specimen Description BLOOD BLOOD RIGHT FOREARM  Final   Special Requests   Final    BOTTLES DRAWN AEROBIC AND ANAEROBIC Blood Culture adequate volume   Culture NO GROWTH 5 DAYS  Final   Report Status 07/18/2019 FINAL  Final  Blood Culture (routine x 2)  Status: None   Collection Time: 07/13/19  9:07 PM   Specimen: BLOOD LEFT HAND  Result Value Ref Range Status   Specimen Description BLOOD LEFT HAND  Final   Special Requests   Final    BOTTLES DRAWN AEROBIC AND ANAEROBIC Blood Culture results may not be optimal due to an inadequate volume of blood received in culture bottles   Culture NO GROWTH 5 DAYS  Final   Report Status 07/18/2019 FINAL  Final  SARS CORONAVIRUS 2 (TAT 6-24 HRS) Nasopharyngeal Nasopharyngeal Swab     Status: Abnormal   Collection Time: 07/13/19  9:33 PM   Specimen: Nasopharyngeal Swab  Result Value Ref Range Status   SARS Coronavirus 2 POSITIVE (A) NEGATIVE Final    Comment: RESULT CALLED TO, READ BACK BY AND VERIFIED WITH: RN MEGAN RUGGIERO AT 0500 BY MESSAN HOUEGNIFIO ON 07/14/2019 (NOTE) SARS-CoV-2 target nucleic acids are DETECTED. The SARS-CoV-2 RNA is generally detectable in upper and lower respiratory specimens during the acute phase of infection. Positive results are indicative of the presence of SARS-CoV-2 RNA. Clinical correlation with patient history and other diagnostic information is  necessary to determine patient infection status. Positive results do not rule out bacterial infection or co-infection with other viruses.  The expected result is Negative. Fact Sheet for Patients: HairSlick.no Fact Sheet for Healthcare  Providers: quierodirigir.com This test is not yet approved or cleared by the [place] FDA and  has been authorized for detection and/or diagnosis of SARS-CoV-2 by FDA under an Emergency Use Authorization (EUA). This EUA will remain  in effect (meaning this t est can be used) for the duration of the COVID-19 declaration under Section 564(b)(1) of the Act, 21 U.S.C. section 360bbb-3(b)(1), unless the authorization is terminated or revoked sooner. Performed at [facility], [address] [place], [place] [address]   Urine culture     Status: None   Collection Time: 07/13/19 11:08 PM   Specimen: In/Out Cath Urine  Result Value Ref Range Status   Specimen Description IN/OUT CATH URINE  Final   Special Requests NONE  Final   Culture   Final    NO GROWTH Performed at [facility], [address] [place], [place] [address]    Report Status 07/15/2019 FINAL  Final  CSF culture     Status: None   Collection Time: 07/14/19  1:08 AM   Specimen: Back; Cerebrospinal Fluid  Result Value Ref Range Status   Specimen Description BACK  Final   Special Requests NONE  Final   Gram Stain   Final    CYTOSPIN SMEAR WBC PRESENT, PREDOMINANTLY MONONUCLEAR NO ORGANISMS SEEN    Culture   Final    NO GROWTH 3 DAYS Performed at [facility], [address] [place], [place] [address]    Report Status 07/17/2019 FINAL  Final     Time coordinating discharge: 45 minutes  SIGNED:   Coralie Keens, MD  [facility] 07/19/2019, 8:43 AM

## 2019-08-18 ENCOUNTER — Encounter: Payer: Self-pay | Admitting: Neurology

## 2019-08-18 ENCOUNTER — Encounter: Payer: Self-pay | Admitting: Internal Medicine

## 2019-08-18 ENCOUNTER — Ambulatory Visit: Payer: Medicaid Other | Attending: Internal Medicine | Admitting: Internal Medicine

## 2019-08-18 ENCOUNTER — Other Ambulatory Visit: Payer: Self-pay

## 2019-08-18 VITALS — BP 122/74 | Temp 97.5°F | Ht 65.0 in | Wt 270.8 lb

## 2019-08-18 DIAGNOSIS — D649 Anemia, unspecified: Secondary | ICD-10-CM

## 2019-08-18 DIAGNOSIS — Z6841 Body Mass Index (BMI) 40.0 and over, adult: Secondary | ICD-10-CM | POA: Diagnosis not present

## 2019-08-18 DIAGNOSIS — G43009 Migraine without aura, not intractable, without status migrainosus: Secondary | ICD-10-CM

## 2019-08-18 DIAGNOSIS — Z09 Encounter for follow-up examination after completed treatment for conditions other than malignant neoplasm: Secondary | ICD-10-CM | POA: Diagnosis not present

## 2019-08-18 DIAGNOSIS — G40909 Epilepsy, unspecified, not intractable, without status epilepticus: Secondary | ICD-10-CM | POA: Diagnosis not present

## 2019-08-18 DIAGNOSIS — Z8616 Personal history of COVID-19: Secondary | ICD-10-CM | POA: Diagnosis not present

## 2019-08-18 MED ORDER — LEVETIRACETAM 750 MG PO TABS: 750.0000 mg | ORAL_TABLET | Freq: Two times a day (BID) | ORAL | 3 refills | Status: DC

## 2019-08-18 MED ORDER — SUMATRIPTAN SUCCINATE 50 MG PO TABS: ORAL_TABLET | ORAL | 2 refills | Status: DC

## 2019-08-18 NOTE — Patient Instructions (Signed)
Obesity, Adult Obesity is having too much body fat. Being obese means that your weight is more than what is healthy for you. BMI is a number that explains how much body fat you have. If you have a BMI of 30 or more, you are obese. Obesity is often caused by eating or drinking more calories than your body uses. Changing your lifestyle can help you lose weight. Obesity can cause serious health problems, such as:  Stroke.  Coronary artery disease (CAD).  Type 2 diabetes.  Some types of cancer, including cancers of the colon, breast, uterus, and gallbladder.  Osteoarthritis.  High blood pressure (hypertension).  High cholesterol.  Sleep apnea.  Gallbladder stones.  Infertility problems. What are the causes?  Eating meals each day that are high in calories, sugar, and fat.  Being born with genes that may make you more likely to become obese.  Having a medical condition that causes obesity.  Taking certain medicines.  Sitting a lot (having a sedentary lifestyle).  Not getting enough sleep.  Drinking a lot of drinks that have sugar in them. What increases the risk?  Having a family history of obesity.  Being an African American woman.  Being a Hispanic man.  Living in an area with limited access to: ? Parks, recreation centers, or sidewalks. ? Healthy food choices, such as grocery stores and farmers' markets. What are the signs or symptoms? The main sign is having too much body fat. How is this treated?  Treatment for this condition often includes changing your lifestyle. Treatment may include: ? Changing your diet. This may include making a healthy meal plan. ? Exercise. This may include activity that causes your heart to beat faster (aerobic exercise) and strength training. Work with your doctor to design a program that works for you. ? Medicine to help you lose weight. This may be used if you are not able to lose 1 pound a week after 6 weeks of healthy eating and  more exercise. ? Treating conditions that cause the obesity. ? Surgery. Options may include gastric banding and gastric bypass. This may be done if:  Other treatments have not helped to improve your condition.  You have a BMI of 40 or higher.  You have life-threatening health problems related to obesity. Follow these instructions at home: Eating and drinking   Follow advice from your doctor about what to eat and drink. Your doctor may tell you to: ? Limit fast food, sweets, and processed snack foods. ? Choose low-fat options. For example, choose low-fat milk instead of whole milk. ? Eat 5 or more servings of fruits or vegetables each day. ? Eat at home more often. This gives you more control over what you eat. ? Choose healthy foods when you eat out. ? Learn to read food labels. This will help you learn how much food is in 1 serving. ? Keep low-fat snacks available. ? Avoid drinks that have a lot of sugar in them. These include soda, fruit juice, iced tea with sugar, and flavored milk.  Drink enough water to keep your pee (urine) pale yellow.  Do not go on fad diets. Physical activity  Exercise often, as told by your doctor. Most adults should get up to 150 minutes of moderate-intensity exercise every week.Ask your doctor: ? What types of exercise are safe for you. ? How often you should exercise.  Warm up and stretch before being active.  Do slow stretching after being active (cool down).  Rest between   times of being active. Lifestyle  Work with your doctor and a food expert (dietitian) to set a weight-loss goal that is best for you.  Limit your screen time.  Find ways to reward yourself that do not involve food.  Do not drink alcohol if: ? Your doctor tells you not to drink. ? You are pregnant, may be pregnant, or are planning to become pregnant.  If you drink alcohol: ? Limit how much you use to:  0-1 drink a day for women.  0-2 drinks a day for men. ? Be  aware of how much alcohol is in your drink. In the [place] one drink equals one 12 oz bottle of beer (355 mL), one 5 oz glass of wine (148 mL), or one 1 oz glass of hard liquor (44 mL). General instructions  Keep a weight-loss journal. This can help you keep track of: ? The food that you eat. ? How much exercise you get.  Take over-the-counter and prescription medicines only as told by your doctor.  Take vitamins and supplements only as told by your doctor.  Think about joining a support group.  Keep all follow-up visits as told by your doctor. This is important. Contact a doctor if:  You cannot meet your weight loss goal after you have changed your diet and lifestyle for 6 weeks. Get help right away if you:  Are having trouble breathing.  Are having thoughts of harming yourself. Summary  Obesity is having too much body fat.  Being obese means that your weight is more than what is healthy for you.  Work with your doctor to set a weight-loss goal.  Get regular exercise as told by your doctor. This information is not intended to replace advice given to you by your health care provider. Make sure you discuss any questions you have with your health care provider. Document Revised: 12/16/2017 Document Reviewed: 12/16/2017 Elsevier Patient Education  2020 [employer]   Migraine Headache A migraine headache is a very strong throbbing pain on one side or both sides of your head. This type of headache can also cause other symptoms. It can last from 4 hours to 3 days. Talk with your doctor about what things may bring on (trigger) this condition. What are the causes? The exact cause of this condition is not known. This condition may be triggered or caused by:  Drinking alcohol.  Smoking.  Taking medicines, such as: ? Medicine used to treat chest pain (nitroglycerin). ? Birth control pills. ? Estrogen. ? Some blood pressure medicines.  Eating or drinking certain  products.  Doing physical activity. Other things that may trigger a migraine headache include:  Having a menstrual period.  Pregnancy.  Hunger.  Stress.  Not getting enough sleep or getting too much sleep.  Weather changes.  Tiredness (fatigue). What increases the risk?  Being 25-55 years old.  Being female.  Having a family history of migraine headaches.  Being Caucasian.  Having depression or anxiety.  Being very overweight. What are the signs or symptoms?  A throbbing pain. This pain may: ? Happen in any area of the head, such as on one side or both sides. ? Make it hard to do daily activities. ? Get worse with physical activity. ? Get worse around bright lights or loud noises.  Other symptoms may include: ? Feeling sick to your stomach (nauseous). ? Vomiting. ? Dizziness. ? Being sensitive to bright lights, loud noises, or smells.  Before you get a migraine headache,   you may get warning signs (an aura). An aura may include: ? Seeing flashing lights or having blind spots. ? Seeing bright spots, halos, or zigzag lines. ? Having tunnel vision or blurred vision. ? Having numbness or a tingling feeling. ? Having trouble talking. ? Having weak muscles.  Some people have symptoms after a migraine headache (postdromal phase), such as: ? Tiredness. ? Trouble thinking (concentrating). How is this treated?  Taking medicines that: ? Relieve pain. ? Relieve the feeling of being sick to your stomach. ? Prevent migraine headaches.  Treatment may also include: ? Having acupuncture. ? Avoiding foods that bring on migraine headaches. ? Learning ways to control your body functions (biofeedback). ? Therapy to help you know and deal with negative thoughts (cognitive behavioral therapy). Follow these instructions at home: Medicines  Take over-the-counter and prescription medicines only as told by your doctor.  Ask your doctor if the medicine prescribed to  you: ? Requires you to avoid driving or using heavy machinery. ? Can cause trouble pooping (constipation). You may need to take these steps to prevent or treat trouble pooping:  Drink enough fluid to keep your pee (urine) pale yellow.  Take over-the-counter or prescription medicines.  Eat foods that are high in fiber. These include beans, whole grains, and fresh fruits and vegetables.  Limit foods that are high in fat and sugar. These include fried or sweet foods. Lifestyle  Do not drink alcohol.  Do not use any products that contain nicotine or tobacco, such as cigarettes, e-cigarettes, and chewing tobacco. If you need help quitting, ask your doctor.  Get at least 8 hours of sleep every night.  Limit and deal with stress. General instructions      Keep a journal to find out what may bring on your migraine headaches. For example, write down: ? What you eat and drink. ? How much sleep you get. ? Any change in what you eat or drink. ? Any change in your medicines.  If you have a migraine headache: ? Avoid things that make your symptoms worse, such as bright lights. ? It may help to lie down in a dark, quiet room. ? Do not drive or use heavy machinery. ? Ask your doctor what activities are safe for you.  Keep all follow-up visits as told by your doctor. This is important. Contact a doctor if:  You get a migraine headache that is different or worse than others you have had.  You have more than 15 headache days in one month. Get help right away if:  Your migraine headache gets very bad.  Your migraine headache lasts longer than 72 hours.  You have a fever.  You have a stiff neck.  You have trouble seeing.  Your muscles feel weak or like you cannot control them.  You start to lose your balance a lot.  You start to have trouble walking.  You pass out (faint).  You have a seizure. Summary  A migraine headache is a very strong throbbing pain on one side or  both sides of your head. These headaches can also cause other symptoms.  This condition may be treated with medicines and changes to your lifestyle.  Keep a journal to find out what may bring on your migraine headaches.  Contact a doctor if you get a migraine headache that is different or worse than others you have had.  Contact your doctor if you have more than 15 headache days in a month. This information is   not intended to replace advice given to you by your health care provider. Make sure you discuss any questions you have with your health care provider. Document Revised: 08/05/2018 Document Reviewed: 05/26/2018 Elsevier Patient Education  2020 [employer] Inc.  

## 2019-08-18 NOTE — Progress Notes (Signed)
Patient ID: Gabriella Montgomery, female    DOB: Feb 25, 1986  MRN: [id number]  CC: New Patient (Initial Visit)   Subjective: Gabriella Montgomery is a 34 y.o. female who presents for new patient visit and hospital follow-up.   Her concerns today include:  History of obesity, PE during pregnancy in 2017  Previous PCP was [facility] in [facility].  Last seen there in 2019.  Patient hospitalized 3/18-20 07/2019 with hypoxic respiratory failure secondary to COVID-19 viral pneumonia.  She was treated with remdesivir and steroids.  There was suspicion for bacterial coinfection and she received antibiotic therapy with ceftriaxone and azithromycin.  Hospital course was complicated by new onset generalized seizure.  She had seizure in route to the hospital noted by EMS and also seizure activity during her hospital stay.  Brain MRI and lumbar puncture revealed no structural abnormalities or infectious process.  Patient was started on Keppra and was discharged home on Keppra 750 milligrams twice a day and told to follow-up with neurology as an outpatient.  She also had acute kidney injury that resolved.  Today: Patient reports that she is fully recovered.  Her breathing is back to normal.  She has no residual cough or shortness of breath.  Seizure disorder: She is compliant with Keppra.  She is about to run out and needs refills.  She has not had any further seizures.  She complains of intermittent headaches since February of this year.  Headaches occur about twice a week and can last for 4 to 6 hours.  Headaches always on the right side.  Associated with photophobia, mild dizziness and ringing in the right ear.  She denies any nausea or vomiting with the headaches.  She has not identified any triggers but endorses that they are worse during her menses.  She drinks 1 cup of coffee every morning.  She takes 2 Tylenol when she gets the headache.  It knocks it out in about 30 minutes but the headache  returns once the Tylenol wears off.  She is also concerned about her weight.  She is wanting to lose weight.  Admits that she can do better with eating habits.  She is not very active.  She works from home on the computer all day.  Past medical, surgical, family and social histories reviewed and updated. Patient Active Problem List   Diagnosis Date Noted  . Pneumonia due to COVID-19 virus 07/19/2019  . Sepsis due to pneumonia ([place]) 07/14/2019  . CAP (community acquired pneumonia) 07/14/2019  . Seizure-like activity ([place]) 07/14/2019  . AKI (acute kidney injury) ([place]) 07/14/2019  . Acute hypoxemic respiratory failure due to COVID-19 ([place]) 07/14/2019  . Sepsis ([place]) 07/14/2019     Current Outpatient Medications on File Prior to Visit  Medication Sig Dispense Refill  . acetaminophen (TYLENOL) 325 MG tablet Take 2 tablets (650 mg total) by mouth every 6 (six) hours as needed. (Patient not taking: Reported on 08/18/2019) 56 tablet 0  . Phenylephrine-Pheniramine-DM (THERAFLU COLD & COUGH PO) Take 5 mLs by mouth daily as needed (For cold and flu symptoms).     No current facility-administered medications on file prior to visit.    Allergies  Allergen Reactions  . Azithromycin Anaphylaxis  . Amoxicillin Hives    Has patient had a PCN reaction causing immediate rash, facial/tongue/throat swelling, SOB or lightheadedness with hypotension: No Has patient had a PCN reaction causing severe rash involving mucus membranes or skin necrosis: YEs Has patient had a  PCN reaction that required hospitalization: No Has patient had a PCN reaction occurring within the last 10 years: No If all of the above answers are "NO", then may proceed with Cephalosporin use.  . Clindamycin Hives  . Ibuprofen Hives  . Tramadol Hives    Social History   Socioeconomic History  . Marital status: Single    Spouse name: Not on file  . Number of children: 4  . Years of education: Not on file  . Highest education  level: Some college, no degree  Occupational History  . Not on file  Tobacco Use  . Smoking status: Never Smoker  . Smokeless tobacco: Never Used  Substance and Sexual Activity  . Alcohol use: Yes    Comment: occasionally  . Drug use: Never  . Sexual activity: Not on file  Other Topics Concern  . Not on file  Social History Narrative  . Not on file   Social Determinants of Health   Financial Resource Strain:   . Difficulty of Paying Living Expenses:   Food Insecurity:   . Worried About Programme researcher, broadcasting/film/video in the Last Year:   . Barista in the Last Year:   Transportation Needs:   . Freight forwarder (Medical):   Marland Kitchen Lack of Transportation (Non-Medical):   Physical Activity:   . Days of Exercise per Week:   . Minutes of Exercise per Session:   Stress:   . Feeling of Stress :   Social Connections:   . Frequency of Communication with Friends and Family:   . Frequency of Social Gatherings with Friends and Family:   . Attends Religious Services:   . Active Member of Clubs or Organizations:   . Attends Banker Meetings:   Marland Kitchen Marital Status:   Intimate Partner Violence:   . Fear of Current or Ex-Partner:   . Emotionally Abused:   Marland Kitchen Physically Abused:   . Sexually Abused:     Family History  Problem Relation Age of Onset  . Heart disease Maternal Grandmother   . Lung cancer Maternal Grandmother   . Heart disease Paternal Grandmother     Past Surgical History:  Procedure Laterality Date  . NO PAST SURGERIES      ROS: Review of Systems Negative except as stated above  PHYSICAL EXAM: BP 122/74   Temp (!) 97.5 F (36.4 C)   Ht 5\' 5"  (1.651 m)   Wt 270 lb 12.8 oz (122.8 kg)   BMI 45.06 kg/m   Physical Exam  General appearance - alert, well appearing, obese young African-American female and in no distress Mental status - normal mood, behavior, speech, dress, motor activity, and thought processes Eyes -pale conjunctiva.  Pupils equal and  reactive, extraocular eye movements intact Mouth - mucous membranes moist, pharynx normal without lesions Neck - supple, no significant adenopathy Chest - clear to auscultation, no wheezes, rales or rhonchi, symmetric air entry Heart - normal rate, regular rhythm, normal S1, S2, no murmurs, rubs, clicks or gallops Neurological -cranial nerves grossly intact.  Sensation grossly intact.  Power 5/5 bilaterally in both upper and lower extremities.  Gait is stable.   Extremities - peripheral pulses normal, no pedal edema, no clubbing or cyanosis   CMP Latest Ref Rng & Units 07/19/2019 07/18/2019 07/17/2019  Glucose 70 - 99 mg/dL 98 07/19/2019) 85  BUN 6 - 20 mg/dL 15 16 16   Creatinine 0.44 - 1.00 mg/dL 322(G 2.54  Sodium 135 - 145 mmol/L  139 140 143  Potassium 3.5 - 5.1 mmol/L 3.8 3.3(L) 3.4(L)  Chloride 98 - 111 mmol/L 107 109 109  CO2 22 - 32 mmol/L 22 24 25   Calcium 8.9 - 10.3 mg/dL ) 7.2(C) 8.3(L)  Total Protein 6.5 - 8.1 g/dL - - 6.4(L)  Total Bilirubin 0.3 - 1.2 mg/dL - - 0.6  Alkaline Phos 38 - 126 U/L - - 35(L)  AST 15 - 41 U/L - - 15  ALT 0 - 44 U/L - - 18   Lipid Panel  No results found for: CHOL, TRIG, HDL, CHOLHDL, VLDL, LDLCALC, LDLDIRECT  CBC    Component Value Date/Time   WBC 4.8 07/17/2019 0359   RBC 4.02 07/17/2019 0359   HGB 11.1 (L) 07/17/2019 0359   HCT 35.0 (L) 07/17/2019 0359   PLT 208 07/17/2019 0359   MCV 87.1 07/17/2019 0359   MCH 27.6 07/17/2019 0359   MCHC 31.7 07/17/2019 0359   RDW 14.3 07/17/2019 0359   LYMPHSABS 2.9 07/17/2019 0359   MONOABS 0.3 07/17/2019 0359   EOSABS 0.0 07/17/2019 0359   BASOSABS 0.0 07/17/2019 0359    ASSESSMENT AND PLAN: 1. Hospital discharge follow-up 2. History of 2019 novel coronavirus disease (COVID-19) Patient completely recovered from Covid pneumonia.  3. Seizure disorder (HCC) Developed new onset seizures in the setting of Covid infection.  I will refer her to neurology to get an opinion of whether the seizures  may have been related to the infection and whether the Keppra needs to be continued.  In the meantime I have told the patient to continue taking the Keppra - levETIRAcetam (KEPPRA) 750 MG tablet; Take 1 tablet (750 mg total) by mouth 2 (two) times daily.  Dispense: 60 tablet; Refill: 3 - Ambulatory referral to Neurology  4. Migraine without aura and without status migrainosus, not intractable Headaches suggestive of migraines.  She had recent imaging study of the head during hospitalization.  Discussed migraine diagnoses and management.  I will start her on Imitrex to use as needed for abortive therapy.  I would like to know from the neurologist whether it would be okay for me to put her on Topamax given that she is on Keppra - SUMAtriptan (IMITREX) 50 MG tablet; Take 1 tablet PO at the start of the headache.  May repeat in 2 hours if no relief.  Max 2 tabs/24 hr.  Dispense: 10 tablet; Refill: 2  5. Class 3 severe obesity due to excess calories without serious comorbidity with body mass index (BMI) of 45.0 to 49.9 in adult [facility]) Dietary counseling given and printed information also given. Discussed the importance of regular exercise.  Recommended 150 minutes total of moderate intensity exercise per week.  She is agreeable to referral to medical weight management.  6. Normochromic anemia - CBC - Iron, TIBC and Ferritin Panel    Patient was given the opportunity to ask questions.  Patient verbalized understanding of the plan and was able to repeat key elements of the plan.   Orders Placed This Encounter  Procedures  . CBC  . Iron, TIBC and Ferritin Panel  . Ambulatory referral to Neurology     Requested Prescriptions   Signed Prescriptions Disp Refills  . levETIRAcetam (KEPPRA) 750 MG tablet 60 tablet 3    Sig: Take 1 tablet (750 mg total) by mouth 2 (two) times daily.  . SUMAtriptan (IMITREX) 50 MG tablet 10 tablet 2    Sig: Take 1 tablet PO at the start of the headache.  May repeat  in 2 hours if no relief.  Max 2 tabs/24 hr.    Return in about 7 weeks (around 10/06/2019).  Jonah Blue, MD, FACP

## 2019-08-19 LAB — CBC
Hematocrit: 37.2 % (ref 34.0–46.6)
Hemoglobin: 12 g/dL (ref 11.1–15.9)
MCH: 27.3 pg (ref 26.6–33.0)
MCHC: 32.3 g/dL (ref 31.5–35.7)
MCV: 85 fL (ref 79–97)
Platelets: 293 10*3/uL (ref 150–450)
RBC: 4.39 x10E6/uL (ref 3.77–5.28)
RDW: 14.5 % (ref 11.7–15.4)
WBC: 5.9 10*3/uL (ref 3.4–10.8)

## 2019-08-19 LAB — IRON,TIBC AND FERRITIN PANEL
Ferritin: 33 ng/mL (ref 15–150)
Iron Saturation: 40 % (ref 15–55)
Iron: 147 ug/dL (ref 27–159)
Total Iron Binding Capacity: 363 ug/dL (ref 250–450)
UIBC: 216 ug/dL (ref 131–425)

## 2019-10-03 ENCOUNTER — Ambulatory Visit (INDEPENDENT_AMBULATORY_CARE_PROVIDER_SITE_OTHER): Payer: Medicaid Other | Admitting: Family Medicine

## 2019-10-03 ENCOUNTER — Encounter (INDEPENDENT_AMBULATORY_CARE_PROVIDER_SITE_OTHER): Payer: Self-pay | Admitting: Family Medicine

## 2019-10-03 ENCOUNTER — Other Ambulatory Visit: Payer: Self-pay

## 2019-10-03 VITALS — BP 128/85 | HR 90 | Temp 97.9°F | Ht 66.0 in | Wt 267.0 lb

## 2019-10-03 DIAGNOSIS — R0602 Shortness of breath: Secondary | ICD-10-CM

## 2019-10-03 DIAGNOSIS — R5383 Other fatigue: Secondary | ICD-10-CM | POA: Diagnosis not present

## 2019-10-03 DIAGNOSIS — G40909 Epilepsy, unspecified, not intractable, without status epilepticus: Secondary | ICD-10-CM | POA: Diagnosis not present

## 2019-10-03 DIAGNOSIS — Z1331 Encounter for screening for depression: Secondary | ICD-10-CM | POA: Diagnosis not present

## 2019-10-03 DIAGNOSIS — Z0289 Encounter for other administrative examinations: Secondary | ICD-10-CM

## 2019-10-03 DIAGNOSIS — Z6841 Body Mass Index (BMI) 40.0 and over, adult: Secondary | ICD-10-CM | POA: Diagnosis not present

## 2019-10-03 NOTE — Progress Notes (Signed)
Dear Dr. Laural Montgomery,   Thank you for referring Gabriella Montgomery to our clinic. The following note includes my evaluation and treatment recommendations.  Chief Complaint:   OBESITY Gabriella Montgomery (MR# [id number]) is a 34 y.o. female who presents for evaluation and treatment of obesity and related comorbidities. Current BMI is Body mass index is 43.09 kg/m. Gabriella Montgomery has been struggling with her weight for many years and has been unsuccessful in either losing weight, maintaining weight loss, or reaching her healthy weight goal.  Gabriella Montgomery is currently in the action stage of change and ready to dedicate time achieving and maintaining a healthier weight. Gabriella Montgomery is interested in becoming our patient and working on intensive lifestyle modifications including (but not limited to) diet and exercise for weight loss.  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  She lives with her mother and her 3 children, ages 37, 27, and 7.  Her mother buys the groceries and does the cooking.  She works from 12 pm-9 pm.  Gabriella Montgomery provided the following food recall today:  Breakfast:  Skips. Lunch:  Sandwich. Dinner:  Gets full fast. Snacks:  Chips. Drinks:  Geophysical data processor. The above food recall is typical since she had COVID.  Gabriella Montgomery habits were reviewed today and are as follows: Her family eats meals together, she thinks her family will eat healthier with her, her desired weight loss is 80 pounds, she started gaining weight last year, her heaviest weight ever was 280 pounds, she is a picky eater, she craves sour foods, she skips breakfast frequently, she is frequently drinking liquids with calories and she frequently eats larger portions than normal.  Depression Screen Gabriella Montgomery Food and Mood (modified PHQ-9) score was 8.  Depression screen [facility] 2/9 10/03/2019  Decreased Interest 0  Down, Depressed, Hopeless 1  PHQ - 2 Score 1  Altered sleeping 0  Tired, decreased energy 1  Change in appetite 1  Feeling  bad or failure about yourself  2  Trouble concentrating 3  Moving slowly or fidgety/restless 0  Suicidal thoughts 0  PHQ-9 Score 8  Difficult doing work/chores Not difficult at all   Subjective:   1. Other fatigue Gabriella Montgomery denies daytime somnolence and denies waking up still tired. Patent has a history of symptoms of snoring. Gabriella Montgomery generally gets 7 or 8 hours of sleep per night, and states that she has generally restful sleep. Snoring is present. Apneic episodes are not present. Epworth Sleepiness Score is 3.  2. SOB (shortness of breath) on exertion Gabriella Montgomery notes increasing shortness of breath with exercising and seems to be worsening over time with weight gain. She notes getting out of breath sooner with activity than she used to. This has gotten worse recently. Gabriella Montgomery denies shortness of breath at rest or orthopnea.  3. Seizure disorder Gabriella Montgomery) This is a new diagnosis for Gabriella Montgomery.  She was diagnosed 2 months after having COVID.  4. Depression screening Gabriella Montgomery was screened for depression as a routine part of her new patient workup today.  Assessment/Plan:   1. Other fatigue Gabriella Montgomery does feel that her weight is causing her energy to be lower than it should be. Fatigue may be related to obesity, depression or many other causes. Labs will be ordered, and in the meanwhile, Gabriella Montgomery will focus on self care including making healthy food choices, increasing physical activity and focusing on stress reduction.  Orders - EKG 12-Lead - Comprehensive metabolic panel - Hemoglobin A1c - Insulin, random - VITAMIN  D 25 Hydroxy (Vit-D Deficiency, Fractures) - TSH - T4, free - T3 - Anemia panel  2. SOB (shortness of breath) on exertion Gabriella Montgomery does feel that she gets out of breath more easily that she used to when she exercises. Gabriella Montgomery's shortness of breath appears to be obesity related and exercise induced. She has agreed to work on weight loss and gradually increase exercise to treat her exercise  induced shortness of breath. Will continue to monitor closely.  Orders - CBC with Differential/Platelet - Lipid panel  3. Seizure disorder [facility]) We will continue to monitor.  4. Depression screening Gabriella Montgomery had a positive depression screening. Depression is commonly associated with obesity and often results in emotional eating behaviors. We will monitor this closely and work on CBT to help improve the non-hunger eating patterns. Referral to Psychology may be required if no improvement is seen as she continues in our clinic.  5. Class 3 severe obesity with serious comorbidity and body mass index (BMI) of 40.0 to 44.9 in adult, unspecified obesity type (HCC) Gabriella Montgomery is currently in the action stage of change and her goal is to continue with weight loss efforts. I recommend Gabriella Montgomery begin the structured treatment plan as follows:  She has agreed to the Category 1 Plan.  Exercise goals: No exercise has been prescribed at this time.   Behavioral modification strategies: increasing lean protein intake, decreasing simple carbohydrates, increasing vegetables, increasing water intake, decreasing liquid calories and decreasing alcohol intake.  She was informed of the importance of frequent follow-up visits to maximize her success with intensive lifestyle modifications for her multiple health conditions. She was informed we would discuss her lab results at her next visit unless there is a critical issue that needs to be addressed sooner. Gabriella Montgomery agreed to keep her next visit at the agreed upon time to discuss these results.  Objective:   Blood pressure 128/85, pulse 90, temperature 97.9 F (36.6 C), temperature source Oral, height 5\' 6"  (1.676 m), weight 267 lb (121.1 kg), last menstrual period 09/12/2019, SpO2 100 %. Body mass index is 43.09 kg/m.  EKG: Normal sinus rhythm, rate 87 bpm.  Indirect Calorimeter completed today shows a VO2 of 190 and a REE of 1322.  Her calculated basal metabolic rate is  6644 thus her basal metabolic rate is worse than expected.  General: Cooperative, alert, well developed, in no acute distress. HEENT: Conjunctivae and lids unremarkable. Cardiovascular: Regular rhythm.  Lungs: Normal work of breathing. Neurologic: No focal deficits.   Lab Results  Component Value Date   CREATININE 0.90 07/19/2019   BUN 15 07/19/2019   NA 139 07/19/2019   K 3.8 07/19/2019   CL 107 07/19/2019   CO2 22 07/19/2019   Lab Results  Component Value Date   ALT 18 07/17/2019   AST 15 07/17/2019   ALKPHOS 35 (L) 07/17/2019   BILITOT 0.6 07/17/2019   Lab Results  Component Value Date   WBC 5.9 08/18/2019   HGB 12.0 08/18/2019   HCT 37.2 08/18/2019   MCV 85 08/18/2019   PLT 293 08/18/2019   Lab Results  Component Value Date   IRON 147 08/18/2019   TIBC 363 08/18/2019   FERRITIN 33 08/18/2019   Attestation Statements:   This is the patient's first visit at [facility]. The patient's NEW PATIENT PACKET was reviewed at length. Included in the packet: current and past health history, medications, allergies, ROS, gynecologic history (women only), surgical history, family history, social history, weight history, weight  loss surgery history (for those that have had weight loss surgery), nutritional evaluation, mood and food questionnaire, PHQ9, Epworth questionnaire, sleep habits questionnaire, patient life and health improvement goals questionnaire. These will all be scanned into the patient's chart under media.   During the visit, I independently reviewed the patient's EKG, bioimpedance scale results, and indirect calorimeter results. I used this information to tailor a meal plan for the patient that will help her to lose weight and will improve her obesity-related conditions going forward. I performed a medically necessary appropriate examination and/or evaluation. I discussed the assessment and treatment plan with the patient. The patient was provided an  opportunity to ask questions and all were answered. The patient agreed with the plan and demonstrated an understanding of the instructions. Labs were ordered at this visit and will be reviewed at the next visit unless more critical results need to be addressed immediately. Clinical information was updated and documented in the EMR.   Time spent on visit including pre-visit chart review and post-visit charting and care was 60 minutes.   I, Insurance claims handler, CMA, am acting as transcriptionist for Helane Rima, DO  I have reviewed the above documentation for accuracy and completeness, and I agree with the above. Helane Rima, DO

## 2019-10-04 LAB — HEMOGLOBIN A1C
Est. average glucose Bld gHb Est-mCnc: 108 mg/dL
Hgb A1c MFr Bld: 5.4 % (ref 4.8–5.6)

## 2019-10-04 LAB — CBC WITH DIFFERENTIAL/PLATELET
Basophils Absolute: 0 10*3/uL (ref 0.0–0.2)
Basos: 1 %
EOS (ABSOLUTE): 0.2 10*3/uL (ref 0.0–0.4)
Eos: 3 %
Hemoglobin: 12.9 g/dL (ref 11.1–15.9)
Immature Grans (Abs): 0 10*3/uL (ref 0.0–0.1)
Immature Granulocytes: 0 %
Lymphocytes Absolute: 2.8 10*3/uL (ref 0.7–3.1)
Lymphs: 45 %
MCH: 27.8 pg (ref 26.6–33.0)
MCHC: 32.2 g/dL (ref 31.5–35.7)
MCV: 86 fL (ref 79–97)
Monocytes Absolute: 0.4 10*3/uL (ref 0.1–0.9)
Monocytes: 6 %
Neutrophils Absolute: 2.7 10*3/uL (ref 1.4–7.0)
Neutrophils: 45 %
Platelets: 269 10*3/uL (ref 150–450)
RBC: 4.64 x10E6/uL (ref 3.77–5.28)
RDW: 14.2 % (ref 11.7–15.4)
WBC: 6.1 10*3/uL (ref 3.4–10.8)

## 2019-10-04 LAB — ANEMIA PANEL
Ferritin: 21 ng/mL (ref 15–150)
Folate, Hemolysate: 462 ng/mL
Folate, RBC: 1152 ng/mL (ref >498)
Hematocrit: 40.1 % (ref 34.0–46.6)
Iron Saturation: 11 % — ABNORMAL LOW (ref 15–55)
Iron: 41 ug/dL (ref 27–159)
Retic Ct Pct: 2.5 % (ref 0.6–2.6)
Total Iron Binding Capacity: 375 ug/dL (ref 250–450)
UIBC: 334 ug/dL (ref 131–425)
Vitamin B-12: 829 pg/mL (ref 232–1245)

## 2019-10-04 LAB — COMPREHENSIVE METABOLIC PANEL
ALT: 36 IU/L — ABNORMAL HIGH (ref 0–32)
AST: 24 IU/L (ref 0–40)
Albumin/Globulin Ratio: 1.4 (ref 1.2–2.2)
Albumin: 4.6 g/dL (ref 3.8–4.8)
Alkaline Phosphatase: 76 IU/L (ref 48–121)
BUN/Creatinine Ratio: 13 (ref 9–23)
BUN: 10 mg/dL (ref 6–20)
Bilirubin Total: 0.3 mg/dL (ref 0.0–1.2)
CO2: 22 mmol/L (ref 20–29)
Calcium: 9.6 mg/dL (ref 8.7–10.2)
Chloride: 102 mmol/L (ref 96–106)
Creatinine, Ser: 0.8 mg/dL (ref 0.57–1.00)
GFR calc Af Amer: 114 mL/min/{1.73_m2} (ref >59)
GFR calc non Af Amer: 99 mL/min/{1.73_m2} (ref >59)
Globulin, Total: 3.2 g/dL (ref 1.5–4.5)
Glucose: 77 mg/dL (ref 65–99)
Potassium: 4.3 mmol/L (ref 3.5–5.2)
Sodium: 139 mmol/L (ref 134–144)
Total Protein: 7.8 g/dL (ref 6.0–8.5)

## 2019-10-04 LAB — LIPID PANEL
Chol/HDL Ratio: 3.5 ratio (ref 0.0–4.4)
Cholesterol, Total: 147 mg/dL (ref 100–199)
HDL: 42 mg/dL (ref >39)
LDL Chol Calc (NIH): 87 mg/dL (ref 0–99)
Triglycerides: 94 mg/dL (ref 0–149)
VLDL Cholesterol Cal: 18 mg/dL (ref 5–40)

## 2019-10-04 LAB — T4, FREE: Free T4: 1.08 ng/dL (ref 0.82–1.77)

## 2019-10-04 LAB — INSULIN, RANDOM: INSULIN: 26 u[IU]/mL — ABNORMAL HIGH (ref 2.6–24.9)

## 2019-10-04 LAB — VITAMIN D 25 HYDROXY (VIT D DEFICIENCY, FRACTURES): Vit D, 25-Hydroxy: 17.7 ng/mL — ABNORMAL LOW (ref 30.0–100.0)

## 2019-10-04 LAB — T3: T3, Total: 137 ng/dL (ref 71–180)

## 2019-10-04 LAB — TSH: TSH: 1.78 u[IU]/mL (ref 0.450–4.500)

## 2019-10-12 ENCOUNTER — Ambulatory Visit: Payer: Medicaid Other | Attending: Internal Medicine | Admitting: Internal Medicine

## 2019-10-12 ENCOUNTER — Encounter: Payer: Self-pay | Admitting: Internal Medicine

## 2019-10-12 ENCOUNTER — Other Ambulatory Visit: Payer: Self-pay

## 2019-10-12 VITALS — BP 122/84 | HR 88 | Temp 97.3°F | Resp 16 | Wt 270.4 lb

## 2019-10-12 DIAGNOSIS — Z79899 Other long term (current) drug therapy: Secondary | ICD-10-CM | POA: Insufficient documentation

## 2019-10-12 DIAGNOSIS — G43009 Migraine without aura, not intractable, without status migrainosus: Secondary | ICD-10-CM | POA: Diagnosis not present

## 2019-10-12 DIAGNOSIS — Z6841 Body Mass Index (BMI) 40.0 and over, adult: Secondary | ICD-10-CM | POA: Diagnosis not present

## 2019-10-12 DIAGNOSIS — G40909 Epilepsy, unspecified, not intractable, without status epilepticus: Secondary | ICD-10-CM | POA: Diagnosis not present

## 2019-10-12 DIAGNOSIS — E559 Vitamin D deficiency, unspecified: Secondary | ICD-10-CM

## 2019-10-12 DIAGNOSIS — R569 Unspecified convulsions: Secondary | ICD-10-CM | POA: Insufficient documentation

## 2019-10-12 DIAGNOSIS — Z7189 Other specified counseling: Secondary | ICD-10-CM

## 2019-10-12 DIAGNOSIS — Z8616 Personal history of COVID-19: Secondary | ICD-10-CM | POA: Insufficient documentation

## 2019-10-12 DIAGNOSIS — Z8249 Family history of ischemic heart disease and other diseases of the circulatory system: Secondary | ICD-10-CM | POA: Insufficient documentation

## 2019-10-12 NOTE — Progress Notes (Signed)
Patient ID: Gabriella Montgomery, female    DOB: [birth year]  MRN: [id number]  CC: Migraine and Seizures   Subjective: Gabriella Montgomery is a 34 y.o. female who presents for 6 wks f/u Her concerns today include:  History of obesity, PE during pregnancy in 2017, COVID-19 pneumonia requiring hospitalization and complicated with new onset seizure 06/2019   Migraines: reports Imitrex works well.  Relief in 15 mins.  Has 2 episodes of HA a mth Has appt with neurologist next mth  Sz disorder: no sz since last visit.  Compliant with taking Keppra.  Has appointment coming up with the neurologist next month.  Obesity:  Started [facility].  It was helpful.  Started walking QOD for past 2 wks. found to have vitamin D deficiency with vitamin D level of 17.7.  HM:  Last pap was July 2018.  Patient Active Problem List   Diagnosis Date Noted  . Vitamin D deficiency 10/12/2019  . Seizure disorder ([place]) 08/18/2019  . Migraine without aura and without status migrainosus, not intractable 08/18/2019  . Class 3 severe obesity due to excess calories without serious comorbidity with body mass index (BMI) of 45.0 to 49.9 in adult [facility]) 08/18/2019  . Normochromic anemia 08/18/2019  . Pneumonia due to COVID-19 virus 07/19/2019  . Seizure-like activity ([place]) 07/14/2019     Current Outpatient Medications on File Prior to Visit  Medication Sig Dispense Refill  . acetaminophen (TYLENOL) 325 MG tablet Take 2 tablets (650 mg total) by mouth every 6 (six) hours as needed. 56 tablet 0  . levETIRAcetam (KEPPRA) 750 MG tablet Take 1 tablet (750 mg total) by mouth 2 (two) times daily. 60 tablet 3  . SUMAtriptan (IMITREX) 50 MG tablet Take 1 tablet PO at the start of the headache.  May repeat in 2 hours if no relief.  Max 2 tabs/24 hr. 10 tablet 2   No current facility-administered medications on file prior to visit.    Allergies  Allergen Reactions  . Azithromycin Anaphylaxis  . Amoxicillin Hives    Has  patient had a PCN reaction causing immediate rash, facial/tongue/throat swelling, SOB or lightheadedness with hypotension: No Has patient had a PCN reaction causing severe rash involving mucus membranes or skin necrosis: YEs Has patient had a PCN reaction that required hospitalization: No Has patient had a PCN reaction occurring within the last 10 years: No If all of the above answers are "NO", then may proceed with Cephalosporin use.  . Clindamycin Hives  . Ibuprofen Hives  . Tramadol Hives    Social History   Socioeconomic History  . Marital status: Single    Spouse name: Not on file  . Number of children: 4  . Years of education: Not on file  . Highest education level: Some college, no degree  Occupational History  . Occupation: works from home  Tobacco Use  . Smoking status: Never Smoker  . Smokeless tobacco: Never Used  Vaping Use  . Vaping Use: Never used  Substance and Sexual Activity  . Alcohol use: Yes    Comment: occasionally  . Drug use: Never  . Sexual activity: Not on file  Other Topics Concern  . Not on file  Social History Narrative  . Not on file   Social Determinants of Health   Financial Resource Strain:   . Difficulty of Paying Living Expenses:   Food Insecurity:   . Worried About Charity fundraiser in the Last Year:   . Ran  Out of Food in the Last Year:   Transportation Needs:   . Lack of Transportation (Medical):   Marland Kitchen Lack of Transportation (Non-Medical):   Physical Activity:   . Days of Exercise per Week:   . Minutes of Exercise per Session:   Stress:   . Feeling of Stress :   Social Connections:   . Frequency of Communication with Friends and Family:   . Frequency of Social Gatherings with Friends and Family:   . Attends Religious Services:   . Active Member of Clubs or Organizations:   . Attends Banker Meetings:   Marland Kitchen Marital Status:   Intimate Partner Violence:   . Fear of Current or Ex-Partner:   . Emotionally Abused:     Marland Kitchen Physically Abused:   . Sexually Abused:     Family History  Problem Relation Age of Onset  . Hyperlipidemia Mother   . Hypertension Mother   . Depression Mother   . Anxiety disorder Mother   . Obesity Mother   . Diabetes Father   . Heart disease Maternal Grandmother   . Lung cancer Maternal Grandmother   . Heart disease Paternal Grandmother     Past Surgical History:  Procedure Laterality Date  . NO PAST SURGERIES      ROS: Review of Systems Negative except as stated above  PHYSICAL EXAM: BP 122/84   Pulse 88   Temp (!) 97.3 F (36.3 C)   Resp 16   Wt 270 lb 6.4 oz (122.7 kg)   LMP 09/12/2019 (Exact Date)   SpO2 99%   BMI 43.64 kg/m   Wt Readings from Last 3 Encounters:  10/12/19 270 lb 6.4 oz (122.7 kg)  10/03/19 267 lb (121.1 kg)  08/18/19 270 lb 12.8 oz (122.8 kg)    Physical Exam  General appearance - alert, well appearing, young obese African-American female and in no distress Mental status - normal mood, behavior, speech, dress, motor activity, and thought processes Chest - clear to auscultation, no wheezes, rales or rhonchi, symmetric air entry Heart - normal rate, regular rhythm, normal S1, S2, no murmurs, rubs, clicks or gallops Extremities - peripheral pulses normal, no pedal edema, no clubbing or cyanosis   CMP Latest Ref Rng & Units 10/03/2019 07/19/2019 07/18/2019  Glucose 65 - 99 mg/dL 77 98 188(C)  BUN 6 - 20 mg/dL 10 15 16   Creatinine 0.57 - 1.00 mg/dL 1.66 0.63  Sodium 134 - 144 mmol/L 139 139 140  Potassium 3.5 - 5.2 mmol/L 4.3 3.8 3.3(L)  Chloride 96 - 106 mmol/L 102 107 109  CO2 20 - 29 mmol/L 22 22 24   Calcium 8.7 - 10.2 mg/dL 9.6 0.16) )  Total Protein 6.0 - 8.5 g/dL 7.8 - -  Total Bilirubin 0.0 - 1.2 mg/dL 0.3 - -  Alkaline Phos 48 - 121 IU/L 76 - -  AST 0 - 40 IU/L 24 - -  ALT 0 - 32 IU/L 36(H) - -   Lipid Panel     Component Value Date/Time   CHOL 147 10/03/2019 1521   TRIG 94 10/03/2019 1521   HDL 42  10/03/2019 1521   CHOLHDL 3.5 10/03/2019 1521   LDLCALC 87 10/03/2019 1521    CBC    Component Value Date/Time   WBC 6.1 10/03/2019 1521   WBC 4.8 07/17/2019 0359   RBC 4.64 10/03/2019 1521   RBC 4.02 07/17/2019 0359   HGB 12.9 10/03/2019 1521   HCT 40.1 10/03/2019 1521  PLT 269 10/03/2019 1521   MCV 86 10/03/2019 1521   MCH 27.8 10/03/2019 1521   MCH 27.6 07/17/2019 0359   MCHC 32.2 10/03/2019 1521   MCHC 31.7 07/17/2019 0359   RDW 14.2 10/03/2019 1521   LYMPHSABS 2.8 10/03/2019 1521   MONOABS 0.3 07/17/2019 0359   EOSABS 0.2 10/03/2019 1521   BASOSABS 0.0 10/03/2019 1521    ASSESSMENT AND PLAN: 1. Migraine without aura and without status migrainosus, not intractable Doing well on Imitrex for abortive therapy.  2. Seizure disorder (HCC) Controlled on Keppra.  Keep appointment with neurology next month  3. Class 3 severe obesity due to excess calories without serious comorbidity with body mass index (BMI) of 40.0 to 44.9 in adult [facility]) She has now been plugged in with medical weight management and plans to continue following with them.  Encouraged her to adhere to dietary recommendations put forth by them.  Commended her on increasing her physical activity  4. Vitamin D deficiency Advised to purchase vitamin D 400 IU over-the-counter and take daily  5. Educated about COVID-19 virus infection She is about 3 months out since having COVID-19 pneumonia.  Advised to get the COVID-19 vaccine once she is 3 to 4 months out.   Patient was given the opportunity to ask questions.  Patient verbalized understanding of the plan and was able to repeat key elements of the plan.   No orders of the defined types were placed in this encounter.    Requested Prescriptions    No prescriptions requested or ordered in this encounter    Return in about 4 months (around 02/11/2020) for 6 wks with Ricky Stabs for PAP.  Jonah Blue, MD, FACP

## 2019-10-12 NOTE — Patient Instructions (Signed)
You have vitamin D deficiency.  You should purchase vitamin D3 400 IU over-the-counter and take 1 daily.  We will have you return in 6 weeks to do your Pap smear.Marland Kitchen

## 2019-10-17 ENCOUNTER — Ambulatory Visit (INDEPENDENT_AMBULATORY_CARE_PROVIDER_SITE_OTHER): Payer: No Typology Code available for payment source | Admitting: Family Medicine

## 2019-10-17 ENCOUNTER — Encounter (INDEPENDENT_AMBULATORY_CARE_PROVIDER_SITE_OTHER): Payer: Self-pay

## 2019-11-09 ENCOUNTER — Ambulatory Visit (INDEPENDENT_AMBULATORY_CARE_PROVIDER_SITE_OTHER): Payer: Medicaid Other | Admitting: Neurology

## 2019-11-09 ENCOUNTER — Encounter: Payer: Self-pay | Admitting: Neurology

## 2019-11-09 ENCOUNTER — Other Ambulatory Visit: Payer: Self-pay

## 2019-11-09 VITALS — BP 122/84 | HR 84 | Ht 66.0 in | Wt 269.4 lb

## 2019-11-09 DIAGNOSIS — R569 Unspecified convulsions: Secondary | ICD-10-CM | POA: Diagnosis not present

## 2019-11-09 DIAGNOSIS — G43009 Migraine without aura, not intractable, without status migrainosus: Secondary | ICD-10-CM

## 2019-11-09 MED ORDER — LEVETIRACETAM 1000 MG PO TABS: 1000.0000 mg | ORAL_TABLET | Freq: Two times a day (BID) | ORAL | 11 refills | Status: DC

## 2019-11-09 NOTE — Patient Instructions (Signed)
Good to meet you!  1. Increase Keppra (Levetiracetam) to 1000mg : take 1 tablet twice a day  2. Schedule 24-hour EEG  3. Keep a calendar of your symptoms  4. Follow-up in 3-4 months, call for any changes  Seizure Precautions: 1. If medication has been prescribed for you to prevent seizures, take it exactly as directed.  Do not stop taking the medicine without talking to your doctor first, even if you have not had a seizure in a long time.   2. Avoid activities in which a seizure would cause danger to yourself or to others.  Don't operate dangerous machinery, swim alone, or climb in high or dangerous places, such as on ladders, roofs, or girders.  Do not drive unless your doctor says you may.  3. If you have any warning that you may have a seizure, lay down in a safe place where you can't hurt yourself.    4.  No driving for 6 months from last seizure, as per [facility].   Please refer to the following link on the Epile[employer] Foundation of America's website for more information: http://www.epilepsyfoundation.org/answerplace/Social/driving/drivingu.cfm   5.  Maintain good sleep hygiene. Avoid alcohol.  6.  Notify your neurology if you are planning pregnancy or if you become pregnant.  7.  Contact your doctor if you have any problems that may be related to the medicine you are taking.  8.  Call 911 and bring the patient back to the ED if:        A.  The seizure lasts longer than 5 minutes.       B.  The patient doesn't awaken shortly after the seizure  C.  The patient has new problems such as difficulty seeing, speaking or moving  D.  The patient was injured during the seizure  E.  The patient has a temperature over 102 F (39C)  F.  The patient vomited and now is having trouble breathing

## 2019-11-09 NOTE — Progress Notes (Signed)
NEUROLOGY CONSULTATION NOTE  TRIS HOWELL MRN: [id number] DOB: [birth year]  Referring provider: Dr. Jonah Blue Primary care provider: Dr. Jonah Blue  Reason for consult:  seizures  Dear Dr Laural Benes:  Thank you for your kind referral of [name] for consultation of the above symptoms. Although her history is well known to you, please allow me to reiterate it for the purpose of our medical record. She is alone in the office today. Records and images were personally reviewed where available.   HISTORY OF PRESENT ILLNESS: This is a pleasant 34 year old right-handed woman with a history of PE during pregnancy in 2017, admitted for COVID pneumonia in March 2021. She had been sick for 3 days with cough, fever, chills, body aches,and vomiting. She recalls going to the bathroom and feeling dizzy, then her legs gave out and she lost consciousness. She partly recalls her mother pulling her to the hallway, then she was told she had a generalized seizure. EMS gave Midazolam. On arrival to the ER, she had one episode of hand shaking where she could still follow some commands. She states she had another episode in the hospital while she was in the bathroom, she started having right arm and leg shaking with no loss of consciousness. She was able to pull the nurse call bell and stand up, the shaking lasted 5-10 seconds. I personally reviewed MRI brain without contrast which was unremarkable. Her routine EEG was within normal limits. She had a lumbar puncture which was traumatic, with pink CSF, it appears tubs were reversed, it was noted that tube 1 had RBC 168, WBC 7, tube 4 RBC 13,125, WBC 8, protein and culture normal. She was discharged home on Levetiracetam 750mg  BID. She denies any prior history of seizures. Since hospital discharge, she has had 3-4 nocturnal right-sided shaking episodes, waking her up from sleep. This is followed by right hand and foot numbness, she usually feels tired and  goes back to sleep. Last episode was at the beginning of June. She denies any sleep deprivation or alcohol use, no missed medication. No side effects on LEV. She has noticed she stares off into space and her mother would ask if she heard what she said. She denies any olfactory/gustatory hallucinations. She has no prior history of headaches, but since March, she has had more headaches, localized over the right frontal region. She has been told there is an "air pocket" above her right brow. She takes Imitrex with good effect, she has a headache twice a month with throbbing, photo/phonosensitivity, no nausea/vomiting. She has occasional episodes of dizziness where she feels it coming on and sits down, resolving in 2 minutes, no associated confusion/focal symptoms. She denies any diplopia, dysarthria/dysphagia, neck/back pain, bowel/bladder dysfunction. She works in [place] and lives with her mother and children.   Epilepsy Risk Factors:  She was born preterm but had a normal early development.  There is no history of febrile convulsions, CNS infections such as meningitis/encephalitis, significant traumatic brain injury, neurosurgical procedures, or family history of seizures.    PAST MEDICAL HISTORY: Past Medical History:  Diagnosis Date  . Anemia   . Kidney disease   . Lactose intolerance   . Pulmonary embolism (HCC)   . Seizures (H[facility])     PAST SURGICAL HISTORY: Past Surgical History:  Procedure Laterality Date  . NO PAST SURGERIES      MEDICATIONS: Current Outpatient Medications on File Prior to Visit  Medication Sig Dispense Refill  .  acetaminophen (TYLENOL) 325 MG tablet Take 2 tablets (650 mg total) by mouth every 6 (six) hours as needed. 56 tablet 0  . cholecalciferol (VITAMIN D3) 25 MCG (1000 UNIT) tablet Take 1,000 Units by mouth daily.    . ferrous sulfate 325 (65 FE) MG EC tablet Take 325 mg by mouth daily.    Marland Kitchen levETIRAcetam (KEPPRA) 750 MG tablet Take 1 tablet (750 mg  total) by mouth 2 (two) times daily. 60 tablet 3  . SUMAtriptan (IMITREX) 50 MG tablet Take 1 tablet PO at the start of the headache.  May repeat in 2 hours if no relief.  Max 2 tabs/24 hr. 10 tablet 2   No current facility-administered medications on file prior to visit.    ALLERGIES: Allergies  Allergen Reactions  . Azithromycin Anaphylaxis  . Amoxicillin Hives    Has patient had a PCN reaction causing immediate rash, facial/tongue/throat swelling, SOB or lightheadedness with hypotension: No Has patient had a PCN reaction causing severe rash involving mucus membranes or skin necrosis: YEs Has patient had a PCN reaction that required hospitalization: No Has patient had a PCN reaction occurring within the last 10 years: No If all of the above answers are "NO", then may proceed with Cephalosporin use.  . Clindamycin Hives  . Ibuprofen Hives  . Tramadol Hives    FAMILY HISTORY: Family History  Problem Relation Age of Onset  . Hyperlipidemia Mother   . Hypertension Mother   . Depression Mother   . Anxiety disorder Mother   . Obesity Mother   . Diabetes Father   . Heart disease Maternal Grandmother   . Lung cancer Maternal Grandmother   . Heart disease Paternal Grandmother     SOCIAL HISTORY: Social History   Socioeconomic History  . Marital status: Single    Spouse name: Not on file  . Number of children: 4  . Years of education: Not on file  . Highest education level: Some college, no degree  Occupational History  . Occupation: works from home  Tobacco Use  . Smoking status: Never Smoker  . Smokeless tobacco: Never Used  Vaping Use  . Vaping Use: Never used  Substance and Sexual Activity  . Alcohol use: Yes    Comment: occasionally  . Drug use: Never  . Sexual activity: Not on file  Other Topics Concern  . Not on file  Social History Narrative   Right handed    Lives with mother and children    Social Determinants of Health   Financial Resource Strain:     . Difficulty of Paying Living Expenses:   Food Insecurity:   . Worried About Programme researcher, broadcasting/film/video in the Last Year:   . Barista in the Last Year:   Transportation Needs:   . Freight forwarder (Medical):   Marland Kitchen Lack of Transportation (Non-Medical):   Physical Activity:   . Days of Exercise per Week:   . Minutes of Exercise per Session:   Stress:   . Feeling of Stress :   Social Connections:   . Frequency of Communication with Friends and Family:   . Frequency of Social Gatherings with Friends and Family:   . Attends Religious Services:   . Active Member of Clubs or Organizations:   . Attends Banker Meetings:   Marland Kitchen Marital Status:   Intimate Partner Violence:   . Fear of Current or Ex-Partner:   . Emotionally Abused:   Marland Kitchen Physically Abused:   .  Sexually Abused:     PHYSICAL EXAM: Vitals:   11/09/19 1021  BP: 122/84  Pulse: 84  SpO2: 100%   General: No acute distress Head:  Normocephalic/atraumatic Skin/Extremities: No rash, no edema Neurological Exam: Mental status: alert and oriented to person, place, and time, no dysarthria or aphasia, Fund of knowledge is appropriate.  Recent and remote memory are intact.  Attention and concentration are normal.   Cranial nerves: CN I: not tested CN II: pupils equal, round and reactive to light, visual fields intact CN III, IV, VI:  full range of motion, no nystagmus, no ptosis CN V: increased pin on right V2 CN VII: upper and lower face symmetric CN VIII: hearing intact to conversation Bulk & Tone: normal, no fasciculations. Motor: 5/5 throughout with no pronator drift. Sensation: hyperesthesia to pin on right arm and leg, intact cold and vibration sense. Romberg test negative Deep Tendon Reflexes: +2 throughout, no ankle clonus Cerebellar: no incoordination on finger to nose testing Gait: narrow-based and steady, able to tandem walk adequately. Tremor: none  IMPRESSION: This is a pleasant 34 year old  right-handed woman with a history of PE during pregnancy in 2017, with new onset seizures that started during COVID infection in March 2021. MRI brain and routine EEG unremarkable. She continues to report nocturnal episodes concerning for focal motor seizures arising from the left hemisphere (right-sided jerking). A 24-hour EEG will be ordered to further classify her seizures. Increase Levetiracetam to 1000mg  BID. She was advised to keep a calendar of her seizures. She has also started having occasional migraines since then, with good response to prn Imitrex. [place] driving laws were discussed with the patient, and she knows to stop driving after a seizure, until 6 months seizure-free. Follow-up in 3-4 months, she knows to call for any changes.   Thank you for allowing me to participate in the care of this patient. Please do not hesitate to call for any questions or concerns.   , M.D.  CC: Dr. Patrcia Dolly

## 2019-11-15 ENCOUNTER — Telehealth: Payer: Self-pay | Admitting: *Deleted

## 2019-11-15 NOTE — Telephone Encounter (Signed)
No answer, unable to [facility] voicemail box not set up.

## 2019-11-23 ENCOUNTER — Ambulatory Visit (HOSPITAL_BASED_OUTPATIENT_CLINIC_OR_DEPARTMENT_OTHER): Payer: Medicaid Other | Admitting: Family

## 2019-11-23 DIAGNOSIS — Z5329 Procedure and treatment not carried out because of patient's decision for other reasons: Secondary | ICD-10-CM

## 2019-11-23 NOTE — Progress Notes (Signed)
Patient did not show for appointment.   

## 2019-12-13 ENCOUNTER — Ambulatory Visit (INDEPENDENT_AMBULATORY_CARE_PROVIDER_SITE_OTHER): Payer: Medicaid Other | Admitting: Neurology

## 2019-12-13 ENCOUNTER — Other Ambulatory Visit: Payer: Self-pay

## 2019-12-13 DIAGNOSIS — R569 Unspecified convulsions: Secondary | ICD-10-CM

## 2019-12-14 ENCOUNTER — Other Ambulatory Visit (HOSPITAL_COMMUNITY)
Admission: RE | Admit: 2019-12-14 | Discharge: 2019-12-14 | Disposition: A | Discharge status: Home / Self Care | Payer: Medicaid Other | Source: Ambulatory Visit | Attending: Family | Admitting: Family

## 2019-12-14 ENCOUNTER — Encounter: Payer: Self-pay | Admitting: Family

## 2019-12-14 ENCOUNTER — Ambulatory Visit: Payer: Medicaid Other | Attending: Family | Admitting: Family

## 2019-12-14 VITALS — BP 126/87 | HR 90 | Temp 98.1°F | Resp 16 | Wt 268.2 lb

## 2019-12-14 DIAGNOSIS — B9689 Other specified bacterial agents as the cause of diseases classified elsewhere: Secondary | ICD-10-CM

## 2019-12-14 DIAGNOSIS — Z124 Encounter for screening for malignant neoplasm of cervix: Secondary | ICD-10-CM | POA: Insufficient documentation

## 2019-12-14 DIAGNOSIS — N760 Acute vaginitis: Secondary | ICD-10-CM

## 2019-12-14 NOTE — Patient Instructions (Signed)
PAP today. Will call with results. Follow-up with primary physician as needed.  Pap Test Why am I having this test? A Pap test, also called a Pap smear, is a screening test to check for signs of:  Cancer of the vagina, cervix, and uterus. The cervix is the lower part of the uterus that opens into the vagina.  Infection.  Changes that may be a sign that cancer is developing (precancerous changes). Women need this test on a regular basis. In general, you should have a Pap test every 3 years until you reach menopause or age 34. Women aged 30-60 may choose to have their Pap test done at the same time as an HPV (human papillomavirus) test every 5 years (instead of every 3 years). Your health care provider may recommend having Pap tests more or less often depending on your medical conditions and past Pap test results. What kind of sample is taken?  Your health care provider will collect a sample of cells from the surface of your cervix. This will be done using a small cotton swab, plastic spatula, or brush. This sample is often collected during a pelvic exam, when you are lying on your back on an exam table with feet in footrests (stirrups). In some cases, fluids (secretions) from the cervix or vagina may also be collected. How do I prepare for this test?  Be aware of where you are in your menstrual cycle. If you are menstruating on the day of the test, you may be asked to reschedule.  You may need to reschedule if you have a known vaginal infection on the day of the test.  Follow instructions from your health care provider about: ? Changing or stopping your regular medicines. Some medicines can cause abnormal test results, such as digitalis and tetracycline. ? Avoiding douching or taking a bath the day before or the day of the test. Tell a health care provider about:  Any allergies you have.  All medicines you are taking, including vitamins, herbs, eye drops, creams, and over-the-counter  medicines.  Any blood disorders you have.  Any surgeries you have had.  Any medical conditions you have.  Whether you are pregnant or may be pregnant. How are the results reported? Your test results will be reported as either abnormal or normal. A false-positive result can occur. A false positive is incorrect because it means that a condition is present when it is not. A false-negative result can occur. A false negative is incorrect because it means that a condition is not present when it is. What do the results mean? A normal test result means that you do not have signs of cancer of the vagina, cervix, or uterus. An abnormal result may mean that you have:  Cancer. A Pap test by itself is not enough to diagnose cancer. You will have more tests done in this case.  Precancerous changes in your vagina, cervix, or uterus.  Inflammation of the cervix.  An STD (sexually transmitted disease).  A fungal infection.  A parasite infection. Talk with your health care provider about what your results mean. Questions to ask your health care provider Ask your health care provider, or the department that is doing the test:  When will my results be ready?  How will I get my results?  What are my treatment options?  What other tests do I need?  What are my next steps? Summary  In general, women should have a Pap test every 3 years until they  reach menopause or age 81.  Your health care provider will collect a sample of cells from the surface of your cervix. This will be done using a small cotton swab, plastic spatula, or brush.  In some cases, fluids (secretions) from the cervix or vagina may also be collected. This information is not intended to replace advice given to you by your health care provider. Make sure you discuss any questions you have with your health care provider. Document Revised: 12/21/2016 Document Reviewed: 12/21/2016 Elsevier Patient Education  [place].

## 2019-12-14 NOTE — Progress Notes (Signed)
Patient ID: Gabriella Montgomery, female    DOB: 05-17-1985  MRN: [id number]  CC: PAP Smear  Subjective: Gabriella Montgomery is a 34 y.o. female with history of seizure disorder, migraine without aura and without status migrainosus not intractable, pneumonia due to Covid-19 virus, normochromic anemia, vitamin D deficiency, and history of pulmonary embolism who presents for PAP smear.   1. PAP Smear: Age at menarche: 34 years old LMP: ended August 4th, 2021  Menses duration, flow, frequency: lasts 3 to 5 days, flow varies, irregular period sometimes 2 times monthly Pregnancies: 4 Births: 4 Abortions: 0 Miscarriages: 1 Sexual concerns: denies Sexually active: denies Contraception use present: denies Vaginal discharge: denies Vaginal lesions: denies Pelvic pain: denies  Last PAP: 2018 Results of last PAP: normal  Patient Active Problem List   Diagnosis Date Noted  . Vitamin D deficiency 10/12/2019  . Seizure disorder (HCC) 08/18/2019  . Migraine without aura and without status migrainosus, not intractable 08/18/2019  . Class 3 severe obesity due to excess calories without serious comorbidity with body mass index (BMI) of 45.0 to 49.9 in adult [facility]) 08/18/2019  . Normochromic anemia 08/18/2019  . Pneumonia due to COVID-19 virus 07/19/2019  . Seizure-like activity (HCC) 07/14/2019  . History of pulmonary embolism 02/17/2016     Current Outpatient Medications on File Prior to Visit  Medication Sig Dispense Refill  . acetaminophen (TYLENOL) 325 MG tablet Take 2 tablets (650 mg total) by mouth every 6 (six) hours as needed. 56 tablet 0  . cholecalciferol (VITAMIN D3) 25 MCG (1000 UNIT) tablet Take 1,000 Units by mouth daily.    . ferrous sulfate 325 (65 FE) MG EC tablet Take 325 mg by mouth daily.    Marland Kitchen levETIRAcetam (KEPPRA) 1000 MG tablet Take 1 tablet (1,000 mg total) by mouth 2 (two) times daily. 60 tablet 11  . SUMAtriptan (IMITREX) 50 MG tablet Take 1 tablet PO at the start of the  headache.  May repeat in 2 hours if no relief.  Max 2 tabs/24 hr. 10 tablet 2   No current facility-administered medications on file prior to visit.    Allergies  Allergen Reactions  . Azithromycin Anaphylaxis  . Amoxicillin Hives    Has patient had a PCN reaction causing immediate rash, facial/tongue/throat swelling, SOB or lightheadedness with hypotension: No Has patient had a PCN reaction causing severe rash involving mucus membranes or skin necrosis: YEs Has patient had a PCN reaction that required hospitalization: No Has patient had a PCN reaction occurring within the last 10 years: No If all of the above answers are "NO", then may proceed with Cephalosporin use.  . Clindamycin Hives  . Ibuprofen Hives  . Tramadol Hives    Social History   Socioeconomic History  . Marital status: Single    Spouse name: Not on file  . Number of children: 4  . Years of education: Not on file  . Highest education level: Some college, no degree  Occupational History  . Occupation: works from home  Tobacco Use  . Smoking status: Never Smoker  . Smokeless tobacco: Never Used  Vaping Use  . Vaping Use: Never used  Substance and Sexual Activity  . Alcohol use: Yes    Comment: occasionally  . Drug use: Never  . Sexual activity: Not on file  Other Topics Concern  . Not on file  Social History Narrative   Right handed    Lives with mother and children    Social Determinants of  Health   Financial Resource Strain:   . Difficulty of Paying Living Expenses: Not on file  Food Insecurity:   . Worried About Programme researcher, broadcasting/film/video in the Last Year: Not on file  . Ran Out of Food in the Last Year: Not on file  Transportation Needs:   . Lack of Transportation (Medical): Not on file  . Lack of Transportation (Non-Medical): Not on file  Physical Activity:   . Days of Exercise per Week: Not on file  . Minutes of Exercise per Session: Not on file  Stress:   . Feeling of Stress : Not on file    Social Connections:   . Frequency of Communication with Friends and Family: Not on file  . Frequency of Social Gatherings with Friends and Family: Not on file  . Attends Religious Services: Not on file  . Active Member of Clubs or Organizations: Not on file  . Attends Banker Meetings: Not on file  . Marital Status: Not on file  Intimate Partner Violence:   . Fear of Current or Ex-Partner: Not on file  . Emotionally Abused: Not on file  . Physically Abused: Not on file  . Sexually Abused: Not on file    Family History  Problem Relation Age of Onset  . Hyperlipidemia Mother   . Hypertension Mother   . Depression Mother   . Anxiety disorder Mother   . Obesity Mother   . Diabetes Father   . Heart disease Maternal Grandmother   . Lung cancer Maternal Grandmother   . Heart disease Paternal Grandmother     Past Surgical History:  Procedure Laterality Date  . NO PAST SURGERIES      ROS: Review of Systems Negative except as stated above  PHYSICAL EXAM: Vitals with BMI 12/14/2019 11/09/2019 10/12/2019  Height - 5\' 6"  -  Weight 268 lbs 3 oz 269 lbs 6 oz 270 lbs 6 oz  BMI - 43.5 43.66  Systolic 126 122  Diastolic 87 84 84  Pulse 90 84 88   Wt Readings from Last 3 Encounters:  12/14/19 268 lb 3.2 oz (121.7 kg)  11/09/19 269 lb 6.4 oz (122.2 kg)  10/12/19 270 lb 6.4 oz (122.7 kg)     Physical Exam General appearance - alert, well appearing, and in no distress, oriented to person, place, and time and overweight Mental status - normal mood, behavior, speech, dress, motor activity, and thought processes Neck - supple, no significant adenopathy Lymphatics - no palpable lymphadenopathy, no hepatosplenomegaly Chest - clear to auscultation, no wheezes, rales or rhonchi, symmetric air entry, no tachypnea, retractions or cyanosis Heart - normal rate, regular rhythm, normal S1, S2, no murmurs, rubs, clicks or gallops Breasts - patient declines to have breast  exam Pelvic - normal external genitalia, vulva, vagina, cervix, uterus and adnexa, VULVA: normal appearing vulva with no masses, tenderness or lesions, VAGINA: normal appearing vagina with normal color and discharge, no lesions, vaginal discharge - white and milky, DNA probe for chlamydia and GC obtained, CERVIX: normal appearing cervix without discharge or lesions, UTERUS: uterus is normal size, shape, consistency and nontender, ADNEXA: normal adnexa in size, nontender and no masses, PAP: Pap smear done today, thin-prep method, HPV test, WET MOUNT done - results: DNA probe for chlamydia and GC obtained, exam chaperoned by 10/14/19, CMA  ASSESSMENT AND PLAN: 1. Cervical cancer screening: - PAP smear today for cervical cancer screening.  - Cervicovaginal ancillary for sexually transmitted infection screening.  -  Will call patient with results.  - Follow-up with primary physician as needed.  - Cervicovaginal ancillary only - Cytology - PAP  Patient was given the opportunity to ask questions.  Patient verbalized understanding of the plan and was able to repeat key elements of the plan. Patient was given clear instructions to go to Emergency Department or return to medical center if symptoms don't improve, worsen, or new problems develop.The patient verbalized understanding.  Rema Fendt, NP

## 2019-12-15 LAB — CERVICOVAGINAL ANCILLARY ONLY
Bacterial Vaginitis (gardnerella): POSITIVE — AB
Candida Glabrata: NEGATIVE
Candida Vaginitis: NEGATIVE
Chlamydia: NEGATIVE
Comment: NEGATIVE
Comment: NEGATIVE
Comment: NEGATIVE
Comment: NEGATIVE
Comment: NEGATIVE
Comment: NORMAL
Neisseria Gonorrhea: NEGATIVE
Trichomonas: NEGATIVE

## 2019-12-15 MED ORDER — METRONIDAZOLE 500 MG PO TABS: 500.0000 mg | ORAL_TABLET | Freq: Two times a day (BID) | ORAL | 0 refills | Status: AC

## 2019-12-15 NOTE — Progress Notes (Signed)
Please call patient with update.   Positive for bacteria vaginitis. Metronidazole prescribed and sent to [facility] at [address] and [address].   Negative for gonorrhea, chlamydia, trichomonas, and yeast.   PAP results smear pending.

## 2019-12-15 NOTE — Addendum Note (Signed)
Addended by: Rema Fendt on: 12/15/2019 03:37 PM   Modules accepted: Orders

## 2019-12-18 LAB — CYTOLOGY - PAP
Adequacy: ABSENT
Comment: NEGATIVE
Diagnosis: NEGATIVE
High risk HPV: NEGATIVE

## 2019-12-18 NOTE — Progress Notes (Signed)
Please call patient with update.   PAP negative for lesions and malignancy.   The transformation zone is absent. The transformation zone is the most common place for cancer cells to develop.   However, in accordance to the American Society for Colposcopy and Cervical Pathology (ASCCP) guidelines patient may keep regular PAP schedule of every 3 to 5 years.   Follow-up with primary physician as needed.

## 2019-12-20 NOTE — Procedures (Signed)
ELECTROENCEPHALOGRAM REPORT  Dates of Recording: 12/13/2019 2:57PM to 12/14/2019 3:02PM Patient's Name: Gabriella Montgomery MRN: [id number] Date of Birth: [birth year]  Referring Provider: Dr. Patrcia Dolly  Procedure: 24-hour ambulatory video EEG  History: This is a 34 year old woman with new onset seizures. She reports recurrent nocturnal episodes of right-sided jerking. EEG for classification.  Medications:  KEPPRA 1000 MG tablet VITAMIN D3 25 MCG (1000 UNIT) tablet 65 FE MG EC tablet IMITREX 50 MG tablet  Technical Summary: This is a 24-hour multichannel digital video EEG recording measured by the international 10-20 system with electrodes applied with paste and impedances below 5000 ohms performed as portable with EKG monitoring.  The digital EEG was referentially recorded, reformatted, and digitally filtered in a variety of bipolar and referential montages for optimal display.    DESCRIPTION OF RECORDING: During maximal wakefulness, the background activity consisted of a symmetric 10 Hz posterior dominant rhythm which was reactive to eye opening.  There is occasional sweat artifact over the frontal leads. There is EKG artifact throughout the recording. There were no epileptiform discharges or focal slowing seen in wakefulness.   During the recording, the patient progresses through wakefulness, drowsiness, and Stage 2 sleep.  Again, there were no epileptiform discharges seen.  Events: On 8/18 at 1754 hours, she has a headache. Patient not on video. Electrographically, there were no EEG or EKG changes seen.  On 8/18 at 2112 hours, she has dizziness. She is sitting on the bed, no clinical changes seen. Electrographically, there were no EEG or EKG changes seen.  On 8/19 at 0927 hours, she has an eye twitch. Patient not on video. Electrographically, there were no EEG or EKG changes seen.  On 8/19 at 1432 hours, she has a headache. Patient not on video. Electrographically, there were no EEG or  EKG changes seen.  There were no electrographic seizures seen.  EKG lead was unremarkable.  IMPRESSION: This 24-hour ambulatory video EEG study is normal.    CLINICAL CORRELATION: A normal EEG does not exclude a clinical diagnosis of epilepsy. Episodes of headache, dizziness, eye twitch, did not show epileptiform correlate. Typical nocturnal jerking episodes were not captured.  If further clinical questions remain, inpatient video EEG monitoring may be helpful.   Patrcia Dolly, M.D.

## 2019-12-25 ENCOUNTER — Telehealth: Payer: Self-pay

## 2019-12-25 NOTE — Telephone Encounter (Signed)
-----   Message from Karen M Aquino, MD sent at 12/20/2019 12:50 PM EDT ----- Pls let her know 24-hour EEG was normal. How is she feeling on the higher dose of Keppra? Any more right arm or leg shaking episodes at night? Thanks 

## 2019-12-25 NOTE — Telephone Encounter (Signed)
Called patient and was unable to leave a message due to mail box not being set up.

## 2019-12-28 NOTE — Telephone Encounter (Signed)
-----   Message from Van Clines, MD sent at 12/20/2019 12:50 PM EDT ----- Pls let her know 24-hour EEG was normal. How is she feeling on the higher dose of Keppra? Any more right arm or leg shaking episodes at night? Thanks

## 2019-12-28 NOTE — Telephone Encounter (Signed)
Called patient and was unable to leave a message because their mail box was not set up.

## 2019-12-29 ENCOUNTER — Ambulatory Visit
Admission: RE | Admit: 2019-12-29 | Discharge: 2019-12-29 | Disposition: A | Discharge status: Home / Self Care | Payer: Medicaid Other | Source: Ambulatory Visit | Attending: Physician Assistant | Admitting: Physician Assistant

## 2019-12-29 ENCOUNTER — Other Ambulatory Visit: Payer: Self-pay

## 2019-12-29 VITALS — BP 139/107 | HR 104 | Temp 98.7°F | Resp 20

## 2019-12-29 DIAGNOSIS — N760 Acute vaginitis: Secondary | ICD-10-CM

## 2019-12-29 MED ORDER — FLUCONAZOLE 150 MG PO TABS: 150.0000 mg | ORAL_TABLET | Freq: Every day | ORAL | 0 refills | Status: DC

## 2019-12-29 NOTE — Discharge Instructions (Signed)
You were treated empirically for yeast. Diflucan as directed. Cytology sent, you will be contacted with any positive results that requires further treatment. Refrain from sexual activity for the next 7 days. Monitor for any worsening of symptoms, fever, abdominal pain, nausea, vomiting, to follow up for reevaluation. 

## 2019-12-29 NOTE — ED Provider Notes (Signed)
[facility]    CSN: [id number] Arrival date & time: 12/29/19  1135      History   Chief Complaint Chief Complaint  Patient presents with  . Vaginitis    HPI Gabriella Montgomery is a 34 y.o. female.   34 year old female comes in for few day history of vaginal itching/irritation, white clumpy discharge. Denies odor, vaginal rashes. Denies abdominal pain, n/v/d. Denies urinary changes. Denies fever, flank/back pain. Had cytology 2 weeks ago during regular checkup with positive BV, she was asymptomatic at the time. Had sexual intercourse since then and would like repeat of cytology. Did have condom use. States had used new soap prior to symptom onset.      Past Medical History:  Diagnosis Date  . Anemia   . Kidney disease   . Lactose intolerance   . Pulmonary embolism (HCC)   . Seizures [facility])     Patient Active Problem List   Diagnosis Date Noted  . Vitamin D deficiency 10/12/2019  . Seizure disorder (HCC) 08/18/2019  . Migraine without aura and without status migrainosus, not intractable 08/18/2019  . Class 3 severe obesity due to excess calories without serious comorbidity with body mass index (BMI) of 45.0 to 49.9 in adult [facility]) 08/18/2019  . Normochromic anemia 08/18/2019  . Pneumonia due to COVID-19 virus 07/19/2019  . Seizure-like activity (HCC) 07/14/2019  . History of pulmonary embolism 02/17/2016    Past Surgical History:  Procedure Laterality Date  . NO PAST SURGERIES      OB History    Gravida  4   Para      Term      Preterm      AB      Living        SAB      TAB      Ectopic      Multiple      Live Births               Home Medications    Prior to Admission medications   Medication Sig Start Date End Date Taking? Authorizing Provider  levETIRAcetam (KEPPRA) 1000 MG tablet Take 1 tablet (1,000 mg total) by mouth 2 (two) times daily. 11/09/19  Yes Van Clines, MD  SUMAtriptan (IMITREX) 50 MG tablet Take 1 tablet  PO at the start of the headache.  May repeat in 2 hours if no relief.  Max 2 tabs/24 hr. 08/18/19  Yes Marcine Matar, MD  acetaminophen (TYLENOL) 325 MG tablet Take 2 tablets (650 mg total) by mouth every 6 (six) hours as needed. 04/03/18   Aviva Kluver B, PA-C  cholecalciferol (VITAMIN D3) 25 MCG (1000 UNIT) tablet Take 1,000 Units by mouth daily.    [provider]  ferrous sulfate 325 (65 FE) MG EC tablet Take 325 mg by mouth daily.    [provider]  fluconazole (DIFLUCAN) 150 MG tablet Take 1 tablet (150 mg total) by mouth daily. Take second dose 72 hours later if symptoms still persists. 12/29/19   Belinda Fisher, PA-C    Family History Family History  Problem Relation Age of Onset  . Hyperlipidemia Mother   . Hypertension Mother   . Depression Mother   . Anxiety disorder Mother   . Obesity Mother   . Diabetes Father   . Heart disease Maternal Grandmother   . Lung cancer Maternal Grandmother   . Heart disease Paternal Grandmother     Social History  Social History   Tobacco Use  . Smoking status: Never Smoker  . Smokeless tobacco: Never Used  Vaping Use  . Vaping Use: Never used  Substance Use Topics  . Alcohol use: Yes    Comment: occasionally  . Drug use: Never     Allergies   Azithromycin, Amoxicillin, Clindamycin, Ibuprofen, and Tramadol   Review of Systems Review of Systems  Reason unable to perform ROS: See HPI as above.     Physical Exam Triage Vital Signs ED Triage Vitals  Enc Vitals Group     BP 12/29/19 1228 (!) 139/107     Pulse Rate 12/29/19 1228 (!) 104     Resp 12/29/19 1228 20     Temp 12/29/19 1228 98.7 F (37.1 C)     Temp Source 12/29/19 1228 Oral     SpO2 12/29/19 1228 98 %     Weight --      Height --      Head Circumference --      Peak Flow --      Pain Score 12/29/19 1226 0     Pain Loc --      Pain Edu? --      Excl. in GC? --    No data found.  Updated Vital Signs BP (!) 139/107 (BP Location: Left  Wrist)   Pulse (!) 104   Temp 98.7 F (37.1 C) (Oral)   Resp 20   LMP 11/26/2019 (Approximate)   SpO2 98%   Physical Exam Constitutional:      General: She is not in acute distress.    Appearance: Normal appearance. She is well-developed. She is not toxic-appearing or diaphoretic.  HENT:     Head: Normocephalic and atraumatic.  Eyes:     Conjunctiva/sclera: Conjunctivae normal.     Pupils: Pupils are equal, round, and reactive to light.  Pulmonary:     Effort: Pulmonary effort is normal. No respiratory distress.  Musculoskeletal:     Cervical back: Normal range of motion and neck supple.  Skin:    General: Skin is warm and dry.  Neurological:     Mental Status: She is alert and oriented to person, place, and time.      UC Treatments / Results  Labs (all labs ordered are listed, but only abnormal results are displayed) Labs Reviewed  CERVICOVAGINAL ANCILLARY ONLY    EKG   Radiology No results found.  Procedures Procedures (including critical care time)  Medications Ordered in UC Medications - No data to display  Initial Impression / Assessment and Plan / UC Course  I have reviewed the triage vital signs and the nursing notes.  Pertinent labs & imaging results that were available during my care of the patient were reviewed by me and considered in my medical decision making (see chart for details).    Patient was treated empirically for yeast with diflucan. Patient was positive for BV 2 weeks ago, but asymptomatic, can defer flagyl. Cytology sent, patient will be contacted with any positive results that require additional treatment. Patient to refrain from sexual activity for the next 7 days. Return precautions given.   Final Clinical Impressions(s) / UC Diagnoses   Final diagnoses:  Acute vaginitis    ED Prescriptions    Medication Sig Dispense Auth. Provider   fluconazole (DIFLUCAN) 150 MG tablet Take 1 tablet (150 mg total) by mouth daily. Take second  dose 72 hours later if symptoms still persists. 2 tablet Belinda Fisher, PA-C  PDMP not reviewed this encounter.   Belinda Fisher, PA-C 12/29/19 1256

## 2019-12-29 NOTE — ED Triage Notes (Signed)
Pt c/o vaginal itching and white discharge since Tuesday. Pt states she was out of town and used a new soap on Sunday.  Pt denies abdominal pain, fever, chills, n/v/d, dysuria symptoms, flank/back pain, or vaginal rashes. No OTC meds tried.

## 2020-01-02 LAB — CERVICOVAGINAL ANCILLARY ONLY
Bacterial Vaginitis (gardnerella): POSITIVE — AB
Chlamydia: NEGATIVE
Comment: NEGATIVE
Comment: NEGATIVE
Comment: NEGATIVE
Comment: NORMAL
Neisseria Gonorrhea: NEGATIVE
Trichomonas: NEGATIVE

## 2020-01-03 ENCOUNTER — Encounter (INDEPENDENT_AMBULATORY_CARE_PROVIDER_SITE_OTHER): Payer: Self-pay | Admitting: Family Medicine

## 2020-02-12 ENCOUNTER — Encounter: Payer: Self-pay | Admitting: Internal Medicine

## 2020-02-12 ENCOUNTER — Ambulatory Visit: Payer: Medicaid Other | Attending: Internal Medicine | Admitting: Internal Medicine

## 2020-02-12 ENCOUNTER — Other Ambulatory Visit: Payer: Self-pay

## 2020-02-12 VITALS — Wt 250.0 lb

## 2020-02-12 DIAGNOSIS — G40909 Epilepsy, unspecified, not intractable, without status epilepticus: Secondary | ICD-10-CM | POA: Diagnosis not present

## 2020-02-12 DIAGNOSIS — Z23 Encounter for immunization: Secondary | ICD-10-CM | POA: Diagnosis not present

## 2020-02-12 DIAGNOSIS — Z6841 Body Mass Index (BMI) 40.0 and over, adult: Secondary | ICD-10-CM

## 2020-02-12 NOTE — Progress Notes (Signed)
Virtual Visit via Telephone Note Due to current restrictions/limitations of in-office visits due to the COVID-19 pandemic, this scheduled clinical appointment was converted to a telehealth visit  I connected with Gabriella Montgomery on 02/12/20 at 10:29 a.m by telephone and verified that I am speaking with the correct person using two identifiers. I am in my office.  The patient is at home.  Only the patient, CMA Julius Bowels and myself participated in this encounter.  I discussed the limitations, risks, security and privacy concerns of performing an evaluation and management service by telephone and the availability of in person appointments. I also discussed with the patient that there may be a patient responsible charge related to this service. The patient expressed understanding and agreed to proceed.   History of Present Illness: History of obesity, migraines w/o aura, vit D def, PE during pregnancy in 2017, COVID-19 pneumonia requiring hospitalization and complicated with new onset seizure 06/2019.  I last saw her in June.  Purpose of today's visit is chronic disease management.  SZ: Since last visit, she has seen the neurologist Dr. Karel Jarvis in July.  24-hour video EEG was done which was normal.  Keppra was increased to 1000 mg twice a day. -Patient reports having a sz in 11/2019.  She felt numbness in her legs then she started having tonic clonic movements in all of her extremities. Mom was with her and told her the episode lasted about 3 mins.  No episode since then.  Obesity:  wgh down to 250 lbs. Down 20 lbs since 09/2019. She has cut out sodas and juices.  Smaller portion sizes. She walks 30 minsaround the block every night with her aunt.  She has not gone back to medical weight management as she cannot afford her co-pay at this time.  HM: she has appt tomorrow to get flu shot from [place].  Outpatient Encounter Medications as of 02/12/2020  Medication Sig  . acetaminophen (TYLENOL) 325 MG tablet  Take 2 tablets (650 mg total) by mouth every 6 (six) hours as needed.  . cholecalciferol (VITAMIN D3) 25 MCG (1000 UNIT) tablet Take 1,000 Units by mouth daily.  . ferrous sulfate 325 (65 FE) MG EC tablet Take 325 mg by mouth daily.  . fluconazole (DIFLUCAN) 150 MG tablet Take 1 tablet (150 mg total) by mouth daily. Take second dose 72 hours later if symptoms still persists.  . levETIRAcetam (KEPPRA) 1000 MG tablet Take 1 tablet (1,000 mg total) by mouth 2 (two) times daily.  . SUMAtriptan (IMITREX) 50 MG tablet Take 1 tablet PO at the start of the headache.  May repeat in 2 hours if no relief.  Max 2 tabs/24 hr.   No facility-administered encounter medications on file as of 02/12/2020.      Observations/Objective: No direct observation done as this was a telephone encounter.  Assessment and Plan: 1. Seizure disorder (HCC) Continue current dose of Keppra.  She will let me know if she has any further seizures so that we can check the Keppra level.  Otherwise she will keep follow-up appointment with the neurologist again next month.  2. Class 3 severe obesity due to excess calories without serious comorbidity with body mass index (BMI) of 40.0 to 44.9 in adult [facility]) Commended her on weight loss so far.  Encouraged her to keep up the good work with healthy eating habits and regular exercise.  3.  Need for flu vaccine Patient will come to get her flu vaccine this week.  Follow Up  Instructions: 4 mths in person   I discussed the assessment and treatment plan with the patient. The patient was provided an opportunity to ask questions and all were answered. The patient agreed with the plan and demonstrated an understanding of the instructions.   The patient was advised to call back or seek an in-person evaluation if the symptoms worsen or if the condition fails to improve as anticipated.  I provided 9 minutes of non-face-to-face time during this encounter.   Jonah Blue, MD

## 2020-02-14 ENCOUNTER — Ambulatory Visit: Payer: Medicaid Other | Admitting: Pharmacist

## 2020-03-06 ENCOUNTER — Ambulatory Visit: Payer: Medicaid Other | Admitting: Neurology

## 2020-06-16 IMAGING — DX DG CHEST 1V PORT
1 series · 1 of 1 positions shown · non-contrast
Comparison: None.

CLINICAL DATA: Fever.  Altered mental status.

EXAM:
PORTABLE CHEST 1 VIEW

[chest]
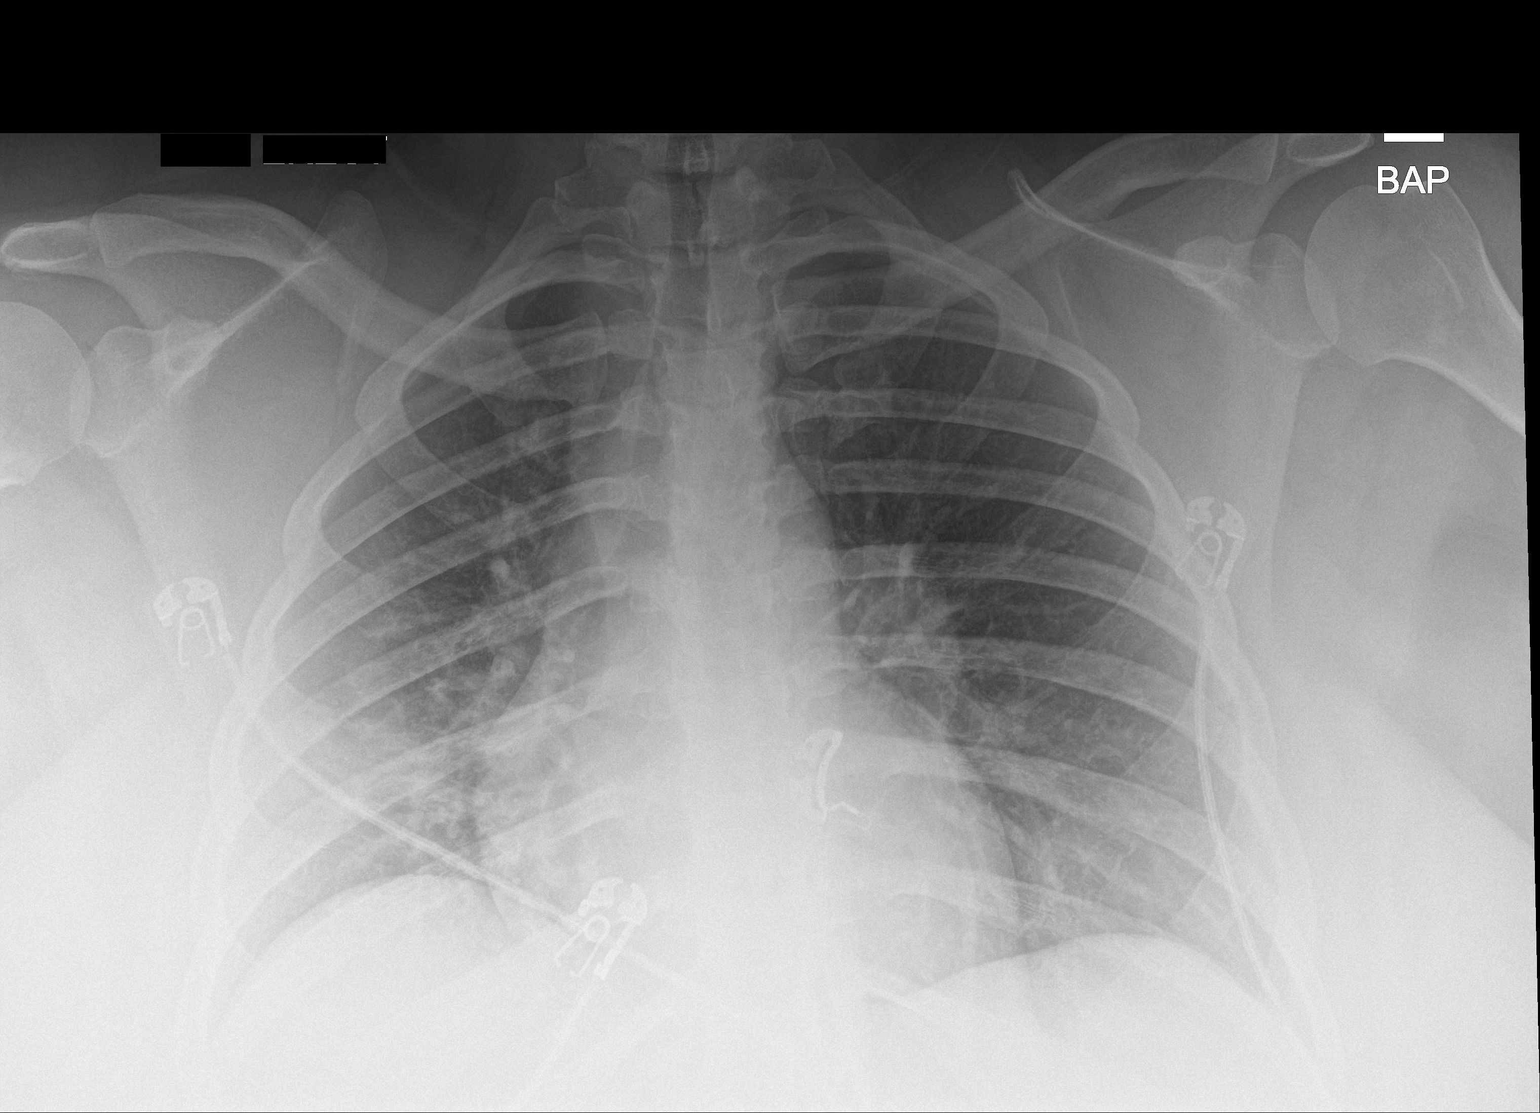

[1 of 1 positions shown; findings below may reference images not displayed]

FINDINGS: Cardiac silhouette normal in size. No mediastinal or hilar masses or
evidence of adenopathy.

Mild linear/interstitial type opacities right lower lung, which may
reflect atelectasis. Lungs otherwise clear. No pleural effusion or
pneumothorax.

Skeletal structures are intact.
IMPRESSION: 1. Mild area linear/interstitial opacity at the right lung base,
most likely atelectasis and crowding due to low lung volumes and
patient rotation. A small focus pneumonia is possible. No other
abnormality.

## 2020-06-16 IMAGING — CT CT ANGIO CHEST
2 of 5 series · 17 of 46 positions shown · IV contrast (APPLIED)
Comparison: None.

CLINICAL DATA: Left-sided chest and abdominal pain. History of
pulmonary embolism.

EXAM:
CT ANGIOGRAPHY CHEST
CT ABDOMEN AND PELVIS WITH CONTRAST
TECHNIQUE: Multidetector CT imaging of the chest was performed using the
standard protocol during bolus administration of intravenous
contrast. Multiplanar CT image reconstructions and MIPs were
obtained to evaluate the vascular anatomy. Multidetector CT imaging
of the abdomen and pelvis was performed using the standard protocol
during bolus administration of intravenous contrast.
CONTRAST:  80mL OMNIPAQUE IOHEXOL 350 MG/ML SOLN

[Series 3: pe 3.0 i30f 3 · axial · 0.85mm/px · z∈[+1180,+1414]mm · 15 of 87 slices shown]
[im 6/87  lung]
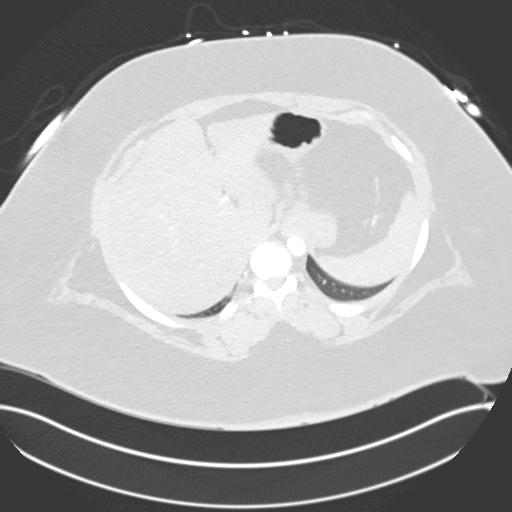
[im 12/87  soft-tissue]
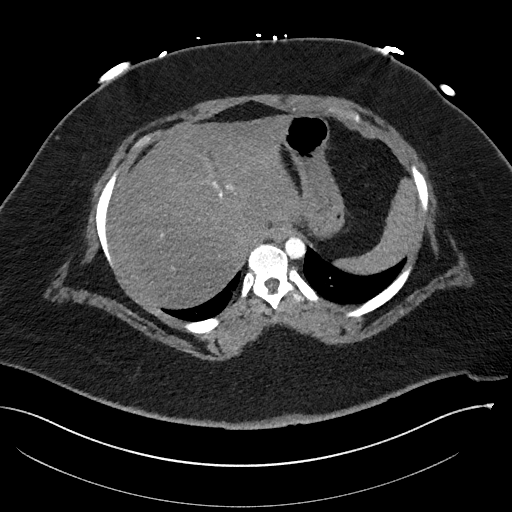
[im 17/87  lung]
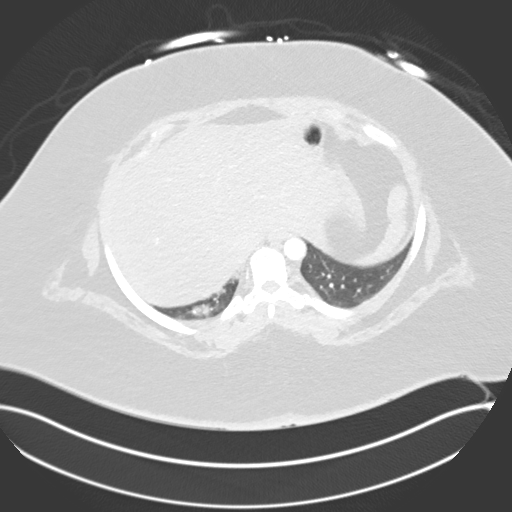
[im 23/87  soft-tissue]
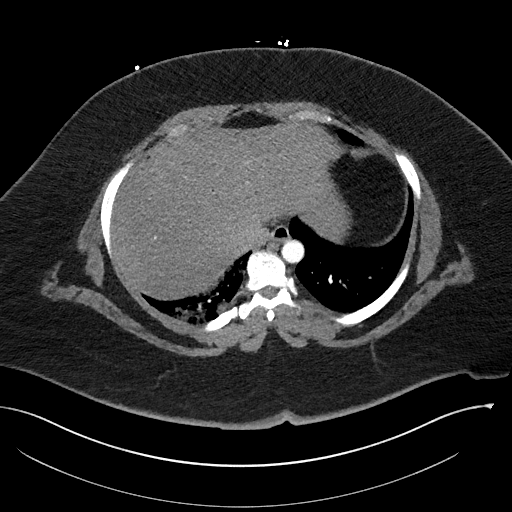
[im 28/87  lung]
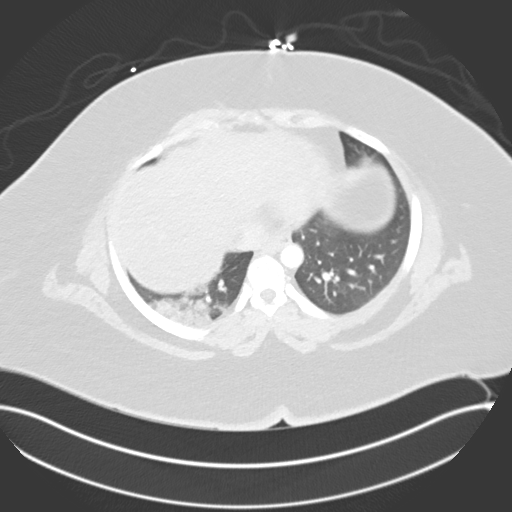
[im 34/87  soft-tissue]
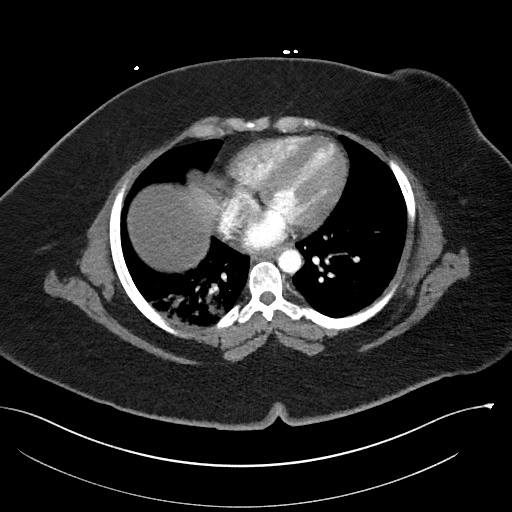
[im 39/87  lung]
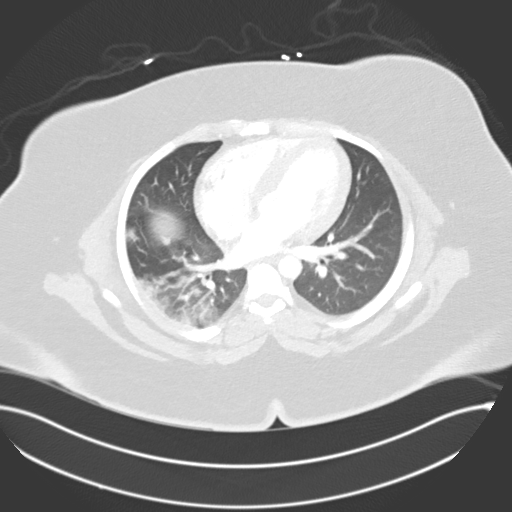
[im 45/87  soft-tissue]
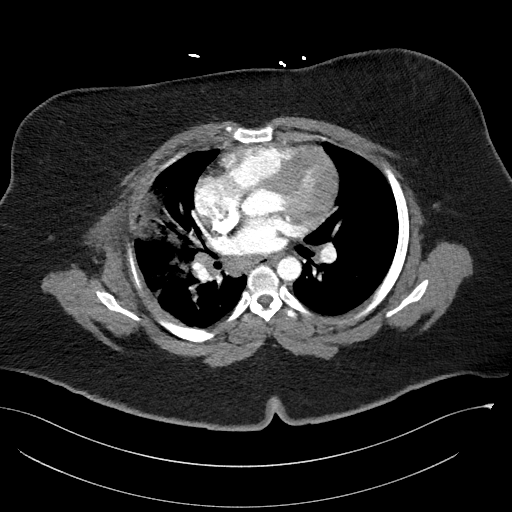
[im 50/87  lung]
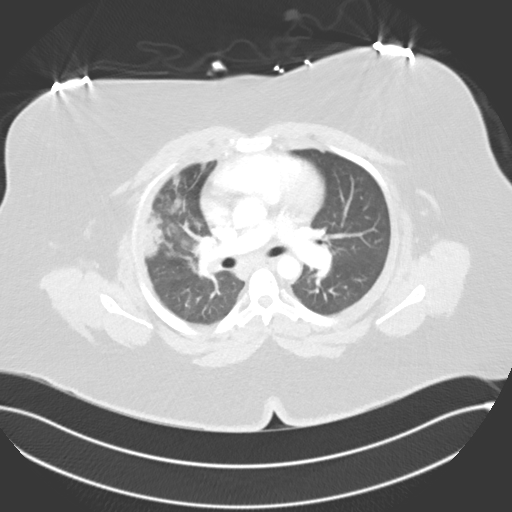
[im 56/87  soft-tissue]
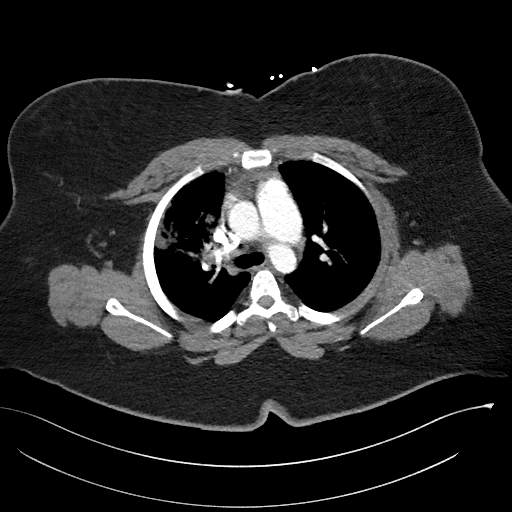
[im 62/87  lung]
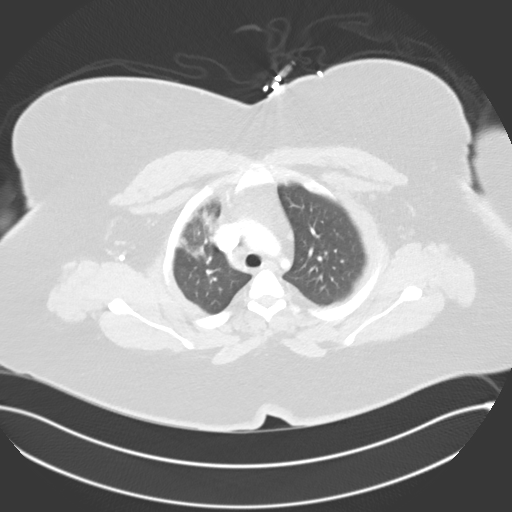
[im 67/87  soft-tissue]
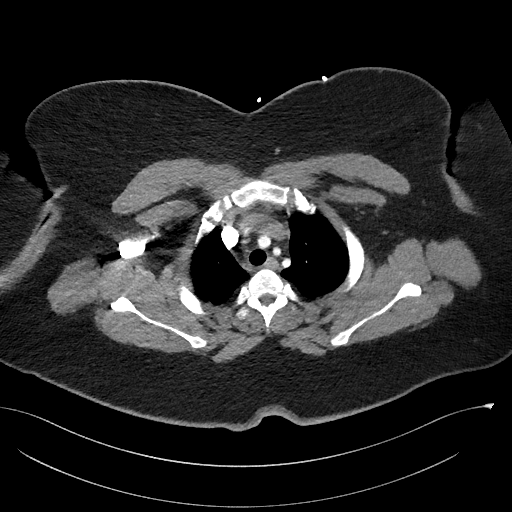
[im 73/87  lung]
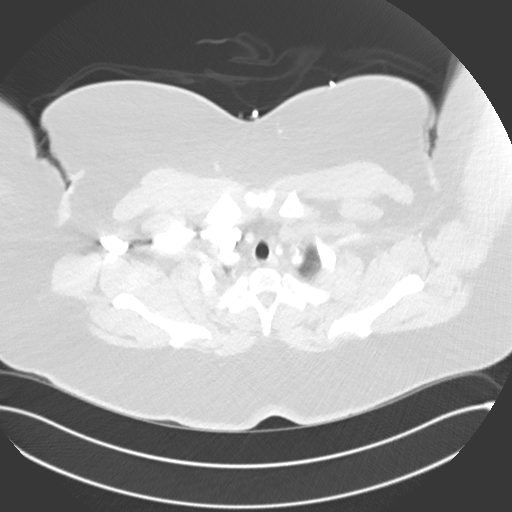
[im 78/87  soft-tissue]
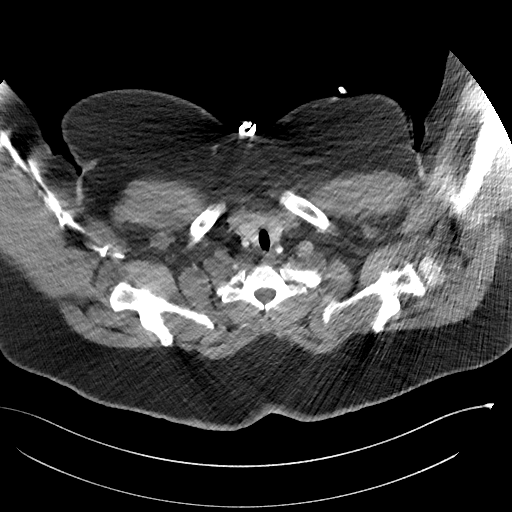
[im 84/87  lung]
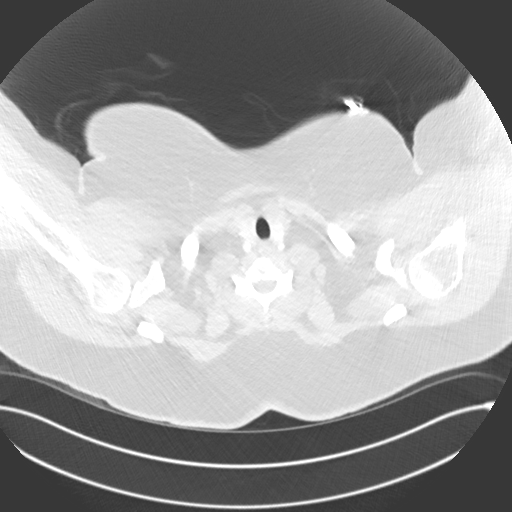

[Series 6: coronal mpr · coronal · 0.51mm/px · 2 of 117 slices shown]
[im 39/117  soft-tissue]
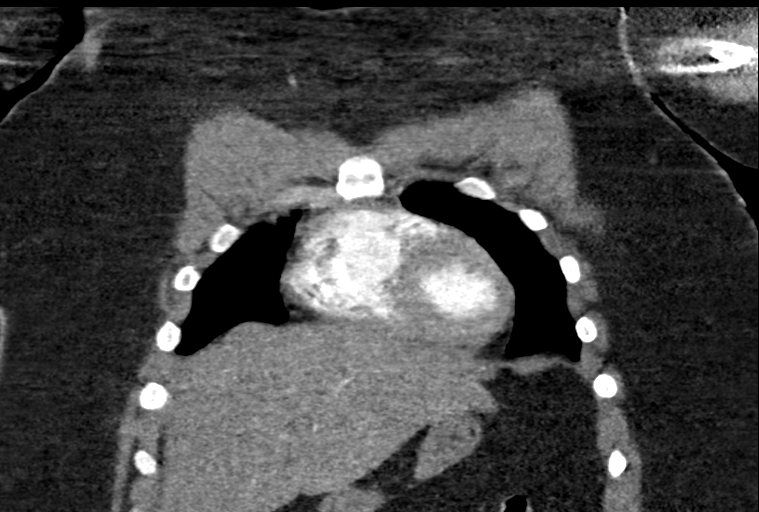
[im 78/117  soft-tissue]
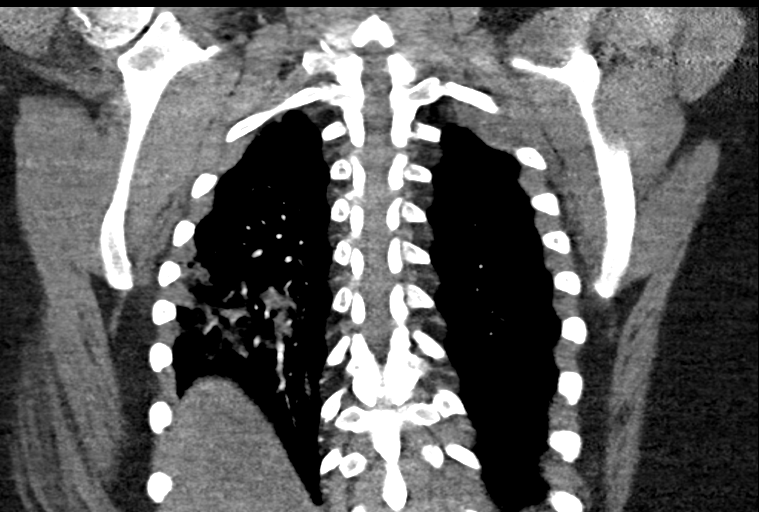

[17 of 46 positions shown; findings below may reference images not displayed]

FINDINGS: CTA CHEST FINDINGS

Cardiovascular: Pulmonary artery hypertension, the pulmonary trunk
measuring 33 mm. No central pulmonary embolism. Patient respiratory
motion precludes evaluation of the more peripheral bilateral
pulmonary arteries. No thoracic aorta calcified atherosclerosis or
aneurysm.

Mediastinum/Nodes: Bulky right hilar and subcarinal lymph nodes
which are noncalcified and measure up to 18 mm diameter. Normal
thyroid and decompressed esophagus.

Lungs/Pleura: Patchy alveolar and airspace disease throughout the
right lung and most confluent in the right middle lobe with air
bronchograms. A couple small patchy ground-glass opacities in the
left base. No pleural effusion or pneumothorax. A 4 mm pleural based
sub solid nodule along the left major fissure, possibly a reactive
subpleural lymph node.

Musculoskeletal: Normal.

Review of the MIP images confirms the above findings.

CT ABDOMEN and PELVIS FINDINGS

Hepatobiliary: Fatty infiltration of the hepatic parenchyma. Normal
gallbladder and biliary tree.

Pancreas: Normal.

Spleen: Normal.

Adrenals/Urinary Tract: Normal.

Stomach/Bowel: Normal appendix, axial series 5, image 63. No mucosal
thickening or obstruction.

Vascular/Lymphatic: A number of small noncalcified lymph nodes at
the mesenteric root, likely reactive, measuring up to 17 by 15 mm.
No abdominal aortic aneurysm, dissection or calcified
atherosclerosis. Patent portal and hepatic veins. Congenital mild
mass effect of the right common iliac artery on the left common
iliac vein.

Reproductive: No apparent abnormality.

Other: No retroperitoneal hematoma, free intraperitoneal fluid or
air.

Musculoskeletal: Normal. The

Review of the MIP images confirms the above findings.
IMPRESSION: No central pulmonary embolism. Respiratory motion artifact precludes
exclusion of a peripheral pulmonary embolism.

Patchy alveolar and airspace disease in the right lung, most
confluent in the right middle lobe, likely pneumonia. A couple small
focal alveolar opacities in the left base, also probably infectious.
Unenhanced CT chest recommended in 3 months to document resolution.

Bulky right hilar and subcarinal lymph nodes, likely reactive.

Pulmonary artery hypertension.

Hepatic steatosis.

A number of small noncalcified lymph nodes at the mesenteric root,
likely a reactive adenitis.

## 2021-02-13 DIAGNOSIS — Z760 Encounter for issue of repeat prescription: Secondary | ICD-10-CM | POA: Diagnosis not present

## 2021-02-13 DIAGNOSIS — G40909 Epilepsy, unspecified, not intractable, without status epilepticus: Secondary | ICD-10-CM | POA: Diagnosis not present

## 2021-02-14 ENCOUNTER — Telehealth: Payer: Self-pay

## 2021-02-14 NOTE — Telephone Encounter (Signed)
Transition Care Management Unsuccessful Follow-up Telephone Call  Date of discharge and from where:  02/13/2021-[facility]  Attempts:  1st Attempt  Reason for unsuccessful TCM follow-up call:  Voice mail full

## 2021-02-15 NOTE — Telephone Encounter (Signed)
Transition Care Management Unsuccessful Follow-up Telephone Call  Date of discharge and from where:  02/13/2021 from [facility]  Attempts:  2nd Attempt  Reason for unsuccessful TCM follow-up call:  Voice mail full

## 2021-02-17 NOTE — Telephone Encounter (Signed)
Transition Care Management Follow-up Telephone Call Date of discharge and from where: 02/13/2021 from [facility] How have you been since you were released from the hospital? Pt stated that she is feeling a lot better and did not have any questions or concerns at this time.  Any questions or concerns? No  Items Reviewed: Did the pt receive and understand the discharge instructions provided? Yes  Medications obtained and verified? Yes  Other? No  Any new allergies since your discharge? No  Dietary orders reviewed? No Do you have support at home? Yes   Functional Questionnaire: (I = Independent and D = Dependent) ADLs: I  Bathing/Dressing- I  Meal Prep- I  Eating- I  Maintaining continence- I  Transferring/Ambulation- I  Managing Meds- I   Follow up appointments reviewed:  PCP Hospital f/u appt confirmed? No   Specialist Hospital f/u appt confirmed? Yes  Pt stated that she has an appointment with the neurologist this week.  Are transportation arrangements needed? No  If their condition worsens, is the pt aware to call PCP or go to the Emergency Dept.? Yes Was the patient provided with contact information for the PCP's office or ED? Yes Was to pt encouraged to call back with questions or concerns? Yes

## 2021-05-04 DIAGNOSIS — T310 Burns involving less than 10% of body surface: Secondary | ICD-10-CM | POA: Diagnosis not present

## 2021-05-04 DIAGNOSIS — H109 Unspecified conjunctivitis: Secondary | ICD-10-CM | POA: Diagnosis not present

## 2021-05-04 DIAGNOSIS — T25112A Burn of first degree of left ankle, initial encounter: Secondary | ICD-10-CM | POA: Diagnosis not present

## 2021-05-10 ENCOUNTER — Telehealth: Payer: Medicaid Other | Admitting: Nurse Practitioner

## 2021-05-10 DIAGNOSIS — L03116 Cellulitis of left lower limb: Secondary | ICD-10-CM | POA: Diagnosis not present

## 2021-05-10 MED ORDER — SULFAMETHOXAZOLE-TRIMETHOPRIM 800-160 MG PO TABS: 1.0000 | ORAL_TABLET | Freq: Two times a day (BID) | ORAL | 0 refills | Status: AC

## 2021-05-10 NOTE — Patient Instructions (Signed)
°  Gabriella Montgomery, thank you for joining Gabriella Rigg, NP for today's virtual visit.  While this provider is not your primary care provider (PCP), if your PCP is located in our provider database this encounter information will be shared with them immediately following your visit.  Consent: (Patient) Gabriella Montgomery provided verbal consent for this virtual visit at the beginning of the encounter.  Current Medications:  Current Outpatient Medications:    acetaminophen (TYLENOL) 325 MG tablet, Take 2 tablets (650 mg total) by mouth every 6 (six) hours as needed., Disp: 56 tablet, Rfl: 0   cholecalciferol (VITAMIN D3) 25 MCG (1000 UNIT) tablet, Take 1,000 Units by mouth daily., Disp: , Rfl:    ferrous sulfate 325 (65 FE) MG EC tablet, Take 325 mg by mouth daily., Disp: , Rfl:    fluconazole (DIFLUCAN) 150 MG tablet, Take 1 tablet (150 mg total) by mouth daily. Take second dose 72 hours later if symptoms still persists., Disp: 2 tablet, Rfl: 0   levETIRAcetam (KEPPRA) 1000 MG tablet, Take 1 tablet (1,000 mg total) by mouth 2 (two) times daily., Disp: 60 tablet, Rfl: 11   SUMAtriptan (IMITREX) 50 MG tablet, Take 1 tablet PO at the start of the headache.  May repeat in 2 hours if no relief.  Max 2 tabs/24 hr., Disp: 10 tablet, Rfl: 2   Medications ordered in this encounter:  No orders of the defined types were placed in this encounter.    *If you need refills on other medications prior to your next appointment, please contact your pharmacy*  Follow-Up: Call back or seek an in-person evaluation if the symptoms worsen or if the condition fails to improve as anticipated.  Other Instructions If no improvement in 48 hours you will need a face to face visit in person at an urgent care.    If you have been instructed to have an in-person evaluation today at a local Urgent Care facility, please use the link below. It will take you to a list of all of our available [facility],  including address, phone number and hours of operation. Please do not delay care.  [facility]  If you or a family member do not have a primary care provider, use the link below to schedule a visit and establish care. When you choose a [facility] primary care physician or advanced practice provider, you gain a long-term partner in health. Find a Primary Care Provider  Learn more about [facility]'s in-office and virtual care options: [facility] - Get Care Now

## 2021-05-10 NOTE — Progress Notes (Signed)
Virtual Visit Consent   Gabriella Montgomery, you are scheduled for a virtual visit with a [facility] provider today.     Just as with appointments in the office, your consent must be obtained to participate.  Your consent will be active for this visit and any virtual visit you may have with one of our providers in the next 365 days.     If you have a MyChart account, a copy of this consent can be sent to you electronically.  All virtual visits are billed to your insurance company just like a traditional visit in the office.    As this is a virtual visit, video technology does not allow for your provider to perform a traditional examination.  This may limit your provider's ability to fully assess your condition.  If your provider identifies any concerns that need to be evaluated in person or the need to arrange testing (such as labs, EKG, etc.), we will make arrangements to do so.     Although advances in technology are sophisticated, we cannot ensure that it will always work on either your end or our end.  If the connection with a video visit is poor, the visit may have to be switched to a telephone visit.  With either a video or telephone visit, we are not always able to ensure that we have a secure connection.     I need to obtain your verbal consent now.   Are you willing to proceed with your visit today?    Gabriella Montgomery has provided verbal consent on 05/10/2021 for a virtual visit (video or telephone).   [name], NP   Date: 05/10/2021 3:06 PM   Virtual Visit via Video Note   I, [name], connected with  Gabriella Montgomery  ([id number], 1985/05/03) on 05/10/21 at  2:30 PM EST by a video-enabled telemedicine application and verified that I am speaking with the correct person using two identifiers.  Location: Patient: Virtual Visit Location Patient: Home Provider: Virtual Visit Location Provider: Home Office   I discussed the limitations of evaluation and management by  telemedicine and the availability of in person appointments. The patient expressed understanding and agreed to proceed.    History of Present Illness: Gabriella Montgomery is a 36 y.o. who identifies as a female who was assigned female at birth, and is being seen today for left lower burn .  She reportedly spilled hot water on her left ankle about 1 week ago. [place] for bacterial conjunctivitis and at that time she also mentioned left ankle pain.  PER ED NOTE: I do not see any significant skin changes although she does complain of pain. At most there is a first-degree burn. We discussed applying an antibiotic ointment to the area affected. Otherwise the patient was well-appearing. Return precautions were given. She was discharged in stable condition.   Today via video there is sloughing of skin on the left lower medial leg. She has been using some sort of cleaning solution on the skin. There is exposed dermis and skin discoloration of the surrounding epidermis. She states she has been applying an occlusive dressing to the area as well as using OTC bacitracin ointment. Will prescribe bactrim prophylactically. She states she can not get to an emergency room or urgent care at this time and is out of town. I have instructed her if there is no improvement in skin condition in  48 hours she will need to have someone take her to be evaluated for a face to face visit.    Problems:  Patient Active Problem List   Diagnosis Date Noted   Vitamin D deficiency 10/12/2019   Seizure disorder (HCC) 08/18/2019   Migraine without aura and without status migrainosus, not intractable 08/18/2019   Class 3 severe obesity due to excess calories without serious comorbidity with body mass index (BMI) of 45.0 to 49.9 in adult [facility]) 08/18/2019   Normochromic anemia 08/18/2019   Pneumonia due to COVID-19 virus 07/19/2019   Seizure-like activity (HCC) 07/14/2019   History of  pulmonary embolism 02/17/2016    Allergies:  Allergies  Allergen Reactions   Azithromycin Anaphylaxis   Amoxicillin Hives    Has patient had a PCN reaction causing immediate rash, facial/tongue/throat swelling, SOB or lightheadedness with hypotension: No Has patient had a PCN reaction causing severe rash involving mucus membranes or skin necrosis: YEs Has patient had a PCN reaction that required hospitalization: No Has patient had a PCN reaction occurring within the last 10 years: No If all of the above answers are "NO", then may proceed with Cephalosporin use.   Clindamycin Hives   Ibuprofen Hives   Tramadol Hives   Medications:  Current Outpatient Medications:    sulfamethoxazole-trimethoprim (BACTRIM DS) 800-160 MG tablet, Take 1 tablet by mouth 2 (two) times daily for 7 days., Disp: 14 tablet, Rfl: 0   acetaminophen (TYLENOL) 325 MG tablet, Take 2 tablets (650 mg total) by mouth every 6 (six) hours as needed., Disp: 56 tablet, Rfl: 0   cholecalciferol (VITAMIN D3) 25 MCG (1000 UNIT) tablet, Take 1,000 Units by mouth daily., Disp: , Rfl:    ferrous sulfate 325 (65 FE) MG EC tablet, Take 325 mg by mouth daily., Disp: , Rfl:    fluconazole (DIFLUCAN) 150 MG tablet, Take 1 tablet (150 mg total) by mouth daily. Take second dose 72 hours later if symptoms still persists., Disp: 2 tablet, Rfl: 0   levETIRAcetam (KEPPRA) 1000 MG tablet, Take 1 tablet (1,000 mg total) by mouth 2 (two) times daily., Disp: 60 tablet, Rfl: 11   SUMAtriptan (IMITREX) 50 MG tablet, Take 1 tablet PO at the start of the headache.  May repeat in 2 hours if no relief.  Max 2 tabs/24 hr., Disp: 10 tablet, Rfl: 2  Observations/Objective: Patient is well-developed, well-nourished in no acute distress.  Resting comfortably at home.  Head is normocephalic, atraumatic.  No labored breathing.  Speech is clear and coherent with logical content.  Patient is alert and oriented at baseline.    Assessment and Plan: 1.  Cellulitis of left lower extremity - sulfamethoxazole-trimethoprim (BACTRIM DS) 800-160 MG tablet; Take 1 tablet by mouth 2 (two) times daily for 7 days.  Dispense: 14 tablet; Refill: 0 NEEDS Face to face evaluation if no improvement in 48 hours  Follow Up Instructions: I discussed the assessment and treatment plan with the patient. The patient was provided an opportunity to ask questions and all were answered. The patient agreed with the plan and demonstrated an understanding of the instructions.  A copy of instructions were sent to the patient via MyChart unless otherwise noted below.    The patient was advised to call back or seek an in-person evaluation if the symptoms worsen or if the condition fails to improve as anticipated.  Time:  I spent 15 minutes with the patient via telehealth technology discussing the above problems/concerns.    [name], NP

## 2021-06-01 DIAGNOSIS — Z743 Need for continuous supervision: Secondary | ICD-10-CM | POA: Diagnosis not present

## 2021-06-01 DIAGNOSIS — I959 Hypotension, unspecified: Secondary | ICD-10-CM | POA: Diagnosis not present

## 2021-06-01 DIAGNOSIS — G40509 Epileptic seizures related to external causes, not intractable, without status epilepticus: Secondary | ICD-10-CM | POA: Diagnosis not present

## 2021-06-01 DIAGNOSIS — R0689 Other abnormalities of breathing: Secondary | ICD-10-CM | POA: Diagnosis not present

## 2021-06-01 DIAGNOSIS — R4182 Altered mental status, unspecified: Secondary | ICD-10-CM | POA: Diagnosis not present

## 2021-06-01 DIAGNOSIS — R569 Unspecified convulsions: Secondary | ICD-10-CM | POA: Diagnosis not present

## 2021-06-01 DIAGNOSIS — Z9114 Patient's other noncompliance with medication regimen: Secondary | ICD-10-CM | POA: Diagnosis not present

## 2021-06-01 DIAGNOSIS — E876 Hypokalemia: Secondary | ICD-10-CM | POA: Diagnosis not present

## 2021-06-01 DIAGNOSIS — R000 Tachycardia, unspecified: Secondary | ICD-10-CM | POA: Diagnosis not present

## 2021-06-02 ENCOUNTER — Telehealth: Payer: Self-pay | Admitting: *Deleted

## 2021-06-02 NOTE — Telephone Encounter (Signed)
Transition Care Management Follow-up Telephone Call Date of discharge and from where: 06/01/2021 [facility] How have you been since you were released from the hospital? "I am really tired" Any questions or concerns? No  Items Reviewed: Did the pt receive and understand the discharge instructions provided? Yes  Medications obtained and verified? Yes  Other? No  Any new allergies since your discharge? No  Dietary orders reviewed? No Do you have support at home? Yes    Functional Questionnaire: (I = Independent and D = Dependent) ADLs: I  Bathing/Dressing- I  Meal Prep- I  Eating- I  Maintaining continence- I  Transferring/Ambulation- I  Managing Meds- I  Follow up appointments reviewed:  PCP Hospital f/u appt confirmed? No  Specialist Hospital f/u appt confirmed? No  - pt states that she thinks she has an upcoming appointment with Neuro Are transportation arrangements needed? No  If their condition worsens, is the pt aware to call PCP or go to the Emergency Dept.? Yes Was the patient provided with contact information for the PCP's office or ED? Yes Was to pt encouraged to call back with questions or concerns? Yes

## 2021-06-06 ENCOUNTER — Telehealth: Payer: Medicaid Other

## 2021-06-06 DIAGNOSIS — Z20822 Contact with and (suspected) exposure to covid-19: Secondary | ICD-10-CM | POA: Diagnosis not present

## 2021-06-06 DIAGNOSIS — G40909 Epilepsy, unspecified, not intractable, without status epilepticus: Secondary | ICD-10-CM | POA: Diagnosis not present

## 2021-06-06 DIAGNOSIS — R0602 Shortness of breath: Secondary | ICD-10-CM | POA: Diagnosis not present

## 2021-06-06 DIAGNOSIS — R569 Unspecified convulsions: Secondary | ICD-10-CM | POA: Diagnosis not present

## 2021-06-07 ENCOUNTER — Encounter: Payer: Self-pay | Admitting: Nurse Practitioner

## 2021-06-07 ENCOUNTER — Telehealth: Payer: Medicaid Other

## 2021-06-09 ENCOUNTER — Telehealth: Payer: Self-pay

## 2021-06-09 NOTE — Telephone Encounter (Signed)
Transition Care Management Unsuccessful Follow-up Telephone Call  Date of discharge and from where:  06/06/2021-[facility]   Attempts:  1st Attempt  Reason for unsuccessful TCM follow-up call:  Unable to reach patient

## 2021-06-10 ENCOUNTER — Telehealth: Payer: Self-pay

## 2021-06-10 NOTE — Patient Outreach (Signed)
Care Coordination  06/10/2021  Gabriella Montgomery Aug 26, 1985 [id number]   Medicaid Managed Care   Unsuccessful Outreach Note  06/10/2021 Name: Gabriella Montgomery MRN: [id number] DOB: [birth year]  Referred by: Marcine Matar, MD Reason for referral : High Risk Managed Medicaid (MM Social Work Unsuccessful telephone outreach)   An unsuccessful telephone outreach was attempted today. The patient was referred to the case management team for assistance with care management and care coordination.   Follow Up Plan:  Social worker will make another attempt for patient to provide MM services  .ajs

## 2021-06-10 NOTE — Telephone Encounter (Signed)
Transition Care Management Unsuccessful Follow-up Telephone Call  Date of discharge and from where:  06/06/2021 from [facility]  Attempts:  2nd Attempt  Reason for unsuccessful TCM follow-up call:  Unable to reach patient

## 2021-06-10 NOTE — Patient Instructions (Signed)
Visit Information  Ms. Gabriella Montgomery  - as a part of your Medicaid benefit, you are eligible for care management and care coordination services at no cost or copay. I was unable to reach you by phone today but would be happy to help you with your health related needs. Please feel free to call me @ [phone].   Marland Kitchen  Mickel Fuchs, BSW, [place] Managed Medicaid Team  [phone]

## 2021-06-11 NOTE — Telephone Encounter (Signed)
Transition Care Management Follow-up Telephone Call Date of discharge and from where: 06/06/2021 - [facility] How have you been since you were released from the hospital? "Okay" Any questions or concerns? No  Items Reviewed: Did the pt receive and understand the discharge instructions provided? Yes  Medications obtained and verified? Yes  Other? No  Any new allergies since your discharge? No  Dietary orders reviewed? No Do you have support at home? Yes    Functional Questionnaire: (I = Independent and D = Dependent) ADLs: I  Bathing/Dressing- I  Meal Prep- I  Eating- I  Maintaining continence- I  Transferring/Ambulation- I  Managing Meds- I  Follow up appointments reviewed:  PCP Hospital f/u appt confirmed? No   Specialist Hospital f/u appt confirmed? No  - pt thinks she has a Neuro appointment but isn't sure Are transportation arrangements needed? No  If their condition worsens, is the pt aware to call PCP or go to the Emergency Dept.? Yes Was the patient provided with contact information for the PCP's office or ED? Yes Was to pt encouraged to call back with questions or concerns? Yes

## 2021-06-19 ENCOUNTER — Telehealth: Payer: Self-pay

## 2021-06-19 NOTE — Patient Outreach (Signed)
Care Coordination  06/19/2021  Gabriella Montgomery May 07, 1985 [id number]  Medicaid Managed Care   Unsuccessful Outreach Note  06/19/2021 Name: Gabriella Montgomery MRN: [id number] DOB: July 03, 1985  Referred by: Ladell Pier, MD Reason for referral : High Risk Managed Medicaid (MM Social Work [employer])   A second unsuccessful telephone outreach was attempted today. The patient was referred to the case management team for assistance with care management and care coordination.   Follow Up Plan: The care management team will reach out to the patient again over the next 7 days.   Mickel Fuchs, BSW, [place] Managed Medicaid Team  [phone]

## 2021-06-19 NOTE — Patient Instructions (Signed)
Visit Information  Ms. Gabriella Montgomery  - as a part of your Medicaid benefit, you are eligible for care management and care coordination services at no cost or copay. I was unable to reach you by phone today but would be happy to help you with your health related needs. Please feel free to call me @ [phone] A member of the Managed Medicaid care management team will reach out to you again over the next 7 days.   Gus Puma, BSW, [facility]     High Risk Managed Medicaid Team  [phone]

## 2021-07-11 ENCOUNTER — Telehealth: Payer: Medicaid Other | Admitting: Physician Assistant

## 2021-07-11 DIAGNOSIS — Z5189 Encounter for other specified aftercare: Secondary | ICD-10-CM | POA: Diagnosis not present

## 2021-07-11 MED ORDER — BACITRACIN 500 UNIT/GM EX OINT: 1.0000 "application " | TOPICAL_OINTMENT | Freq: Two times a day (BID) | CUTANEOUS | 0 refills | Status: DC

## 2021-07-11 NOTE — Progress Notes (Signed)
Ms. Gabriella, Montgomery are scheduled for a virtual visit with your provider today.   ? ?Just as we do with appointments in the office, we must obtain your consent to participate.  Your consent will be active for this visit and any virtual visit you may have with one of our providers in the next 365 days.   ? ?If you have a MyChart account, I can also send a copy of this consent to you electronically.  All virtual visits are billed to your insurance company just like a traditional visit in the office.  As this is a virtual visit, video technology does not allow for your provider to perform a traditional examination.  This may limit your provider's ability to fully assess your condition.  If your provider identifies any concerns that need to be evaluated in person or the need to arrange testing such as labs, EKG, etc, we will make arrangements to do so.   ? ?Although advances in technology are sophisticated, we cannot ensure that it will always work on either your end or our end.  If the connection with a video visit is poor, we may have to switch to a telephone visit.  With either a video or telephone visit, we are not always able to ensure that we have a secure connection.   I need to obtain your verbal consent now.   Are you willing to proceed with your visit today?  ? ?Gabriella Montgomery has provided verbal consent on 07/11/2021 for a virtual visit (video or telephone). ? ? ?Turner Baillie Manfred Shirts, PA-C ?07/11/2021  4:40 PM ? ? ?Date:  07/11/2021  ? ?ID:  Gabriella Montgomery, DOB [birth year], MRN [id number] ? ?Patient Location: Home ?Provider Location: Home Office ? ? ?Participants: Patient and Provider for Visit and Wrap up ? ?Method of visit: Video  ?Location of Patient: Home ?Location of Provider: Home Office ?Consent was obtain for visit over the video. ?Services rendered by provider: Visit was performed via video ? ?A video enabled telemedicine application was used and I verified that I am speaking with the correct person using two  identifiers. ? ?PCP:  Marcine Matar, MD  ? ?Chief Complaint:  ankle  pain ? ?History of Present Illness:   ? ?Gabriella Montgomery is a 36 y.o. female with history as stated below. ?Presents video telehealth for an acute care visit ? ?Pt states she sustained an ankle burn about 2 months ago. She has been using A&D ointment on the wound and has improved but she is still having some pain in the area that has not fully healed. No other reported systemic symptoms. ? ?Past Medical, Surgical, Social History, Allergies, and Medications have been Reviewed. ? ?Past Medical History:  ?Diagnosis Date  ? Anemia   ? Kidney disease   ? Lactose intolerance   ? Pulmonary embolism (HCC)   ? Seizures (HCC)   ? ? ?Current Meds  ?Medication Sig  ? bacitracin 500 UNIT/GM ointment Apply 1 application. topically 2 (two) times daily.  ?  ? ?Allergies:   Azithromycin, Amoxicillin, Clindamycin, Ibuprofen, and Tramadol  ? ?ROS ?See HPI for history of present illness. ? ?Physical Exam ?Skin: ?   Comments: Healing wound noted to the left ankle, no surrounding erythema or drainage  ? ?    MDM: pt with burn 2 months ago with continued pain at the site. Does appears to be healing slowly but well at this time. No evidence of infection. Advised bacitracin and will give tylenol (  is allergic to NSAIDs) for pain. Advised that she will need to f/u with pcp or derm following treatment if she has no improvement.       ? ?There are no diagnoses linked to this encounter. ? ? ?Time:   ?Today, I have spent 10 minutes with the patient with telehealth technology discussing the above problems, reviewing the chart, previous notes, medications and orders.  ? ? ?Tests Ordered: ?No orders of the defined types were placed in this encounter. ? ? ?Medication Changes: ?Meds ordered this encounter  ?Medications  ? bacitracin 500 UNIT/GM ointment  ?  Sig: Apply 1 application. topically 2 (two) times daily.  ?  Dispense:  15 g  ?  Refill:  0  ?  Order Specific Question:    Supervising Provider  ?  Answer:   Eber Hong [[id number]]  ? ? ? ?Disposition:  Follow up  ?Signed, ?Joseth Weigel Manfred Shirts, PA-C  ?07/11/2021 4:40 PM    ? ? ?

## 2021-07-11 NOTE — Patient Instructions (Signed)
?  Gabriella Montgomery, thank you for joining Karrie Meres, PA-C for today's virtual visit.  While this provider is not your primary care provider (PCP), if your PCP is located in our provider database this encounter information will be shared with them immediately following your visit. ? ?Consent: ?(Patient) Gabriella Montgomery provided verbal consent for this virtual visit at the beginning of the encounter. ? ?Current Medications: ? ?Current Outpatient Medications:  ?  acetaminophen (TYLENOL) 325 MG tablet, Take 2 tablets (650 mg total) by mouth every 6 (six) hours as needed., Disp: 56 tablet, Rfl: 0 ?  cholecalciferol (VITAMIN D3) 25 MCG (1000 UNIT) tablet, Take 1,000 Units by mouth daily., Disp: , Rfl:  ?  ferrous sulfate 325 (65 FE) MG EC tablet, Take 325 mg by mouth daily., Disp: , Rfl:  ?  fluconazole (DIFLUCAN) 150 MG tablet, Take 1 tablet (150 mg total) by mouth daily. Take second dose 72 hours later if symptoms still persists., Disp: 2 tablet, Rfl: 0 ?  levETIRAcetam (KEPPRA) 1000 MG tablet, Take 1 tablet (1,000 mg total) by mouth 2 (two) times daily., Disp: 60 tablet, Rfl: 11 ?  SUMAtriptan (IMITREX) 50 MG tablet, Take 1 tablet PO at the start of the headache.  May repeat in 2 hours if no relief.  Max 2 tabs/24 hr., Disp: 10 tablet, Rfl: 2  ? ?Medications ordered in this encounter:  ?No orders of the defined types were placed in this encounter. ?  ? ?*If you need refills on other medications prior to your next appointment, please contact your pharmacy* ? ?Follow-Up: ?Call back or seek an in-person evaluation if the symptoms worsen or if the condition fails to improve as anticipated. ? ?Other Instructions ?Use bacitracin twice daily and use the tylenol as needed for pain  ? ?Follow up with your regular doctor in 1 week for reassessment and seek care sooner if your symptoms worsen or fail to improve. ? ? ? ?If you have been instructed to have an in-person evaluation today at a local Urgent Care facility,  please use the link below. It will take you to a list of all of our available [facility], including address, phone number and hours of operation. Please do not delay care.  ?[facility] ? ?If you or a family member do not have a primary care provider, use the link below to schedule a visit and establish care. When you choose a [facility] primary care physician or advanced practice provider, you gain a long-term partner in health. ?Find a Primary Care Provider ? ?Learn more about [facility]'s in-office and virtual care options: ?[facility] - Get Care Now ? ?

## 2021-08-04 ENCOUNTER — Telehealth: Payer: Medicaid Other | Admitting: Family Medicine

## 2021-08-04 ENCOUNTER — Telehealth: Payer: Medicaid Other | Admitting: Physician Assistant

## 2021-08-04 DIAGNOSIS — J069 Acute upper respiratory infection, unspecified: Secondary | ICD-10-CM

## 2021-08-04 MED ORDER — PSEUDOEPH-BROMPHEN-DM 30-2-10 MG/5ML PO SYRP: 5.0000 mL | ORAL_SOLUTION | Freq: Four times a day (QID) | ORAL | 0 refills | Status: DC | PRN

## 2021-08-04 MED ORDER — IPRATROPIUM BROMIDE 0.03 % NA SOLN: 2.0000 | Freq: Two times a day (BID) | NASAL | 0 refills | Status: DC

## 2021-08-04 MED ORDER — LIDOCAINE VISCOUS HCL 2 % MT SOLN: OROMUCOSAL | 0 refills | Status: DC

## 2021-08-04 MED ORDER — BENZONATATE 100 MG PO CAPS: 100.0000 mg | ORAL_CAPSULE | Freq: Three times a day (TID) | ORAL | 0 refills | Status: DC | PRN

## 2021-08-04 NOTE — Progress Notes (Signed)
?Virtual Visit Consent  ? ?Gabriella Montgomery, you are scheduled for a virtual visit with a [facility] provider today.   ?  ?Just as with appointments in the office, your consent must be obtained to participate.  Your consent will be active for this visit and any virtual visit you may have with one of our providers in the next 365 days.   ?  ?If you have a MyChart account, a copy of this consent can be sent to you electronically.  All virtual visits are billed to your insurance company just like a traditional visit in the office.   ? ?As this is a virtual visit, video technology does not allow for your provider to perform a traditional examination.  This may limit your provider's ability to fully assess your condition.  If your provider identifies any concerns that need to be evaluated in person or the need to arrange testing (such as labs, EKG, etc.), we will make arrangements to do so.   ?  ?Although advances in technology are sophisticated, we cannot ensure that it will always work on either your end or our end.  If the connection with a video visit is poor, the visit may have to be switched to a telephone visit.  With either a video or telephone visit, we are not always able to ensure that we have a secure connection.    ? ?I need to obtain your verbal consent now.   Are you willing to proceed with your visit today?  ?  ?SARAHA GUGGENHEIM has provided verbal consent on 08/04/2021 for a virtual visit (video or telephone). ?  ?Mar Daring, PA-C  ? ?Date: 08/04/2021 4:12 PM ? ? ?Virtual Visit via Video Note  ? ?Reynolds Bowl, connected with  Gabriella Montgomery  ([id number], June 27, 1985) on 08/04/21 at  4:15 PM EDT by a video-enabled telemedicine application and verified that I am speaking with the correct person using two identifiers. ? ?Location: ?Patient: Virtual Visit Location Patient: Home ?Provider: Virtual Visit Location Provider: Home Office ?  ?I discussed the limitations of evaluation and  management by telemedicine and the availability of in person appointments. The patient expressed understanding and agreed to proceed.   ? ?History of Present Illness: ?Gabriella Montgomery is a 36 y.o. who identifies as a female who was assigned female at birth, and is being seen today for sore throat. ? ?HPI: Sore Throat  ?This is a new problem. The current episode started yesterday. The problem has been gradually worsening. Maximum temperature: subjective fever. Associated symptoms include congestion, coughing, ear pain (pressure), headaches, a plugged ear sensation, swollen glands and trouble swallowing (hurts). Pertinent negatives include no diarrhea, hoarse voice or vomiting. She has had no exposure to strep or mono. She has tried acetaminophen for the symptoms. The treatment provided no relief.   ? ? ?Problems:  ?Patient Active Problem List  ? Diagnosis Date Noted  ? Vitamin D deficiency 10/12/2019  ? Seizure disorder ([facility]) 08/18/2019  ? Migraine without aura and without status migrainosus, not intractable 08/18/2019  ? Class 3 severe obesity due to excess calories without serious comorbidity with body mass index (BMI) of 45.0 to 49.9 in adult [facility]) 08/18/2019  ? Normochromic anemia 08/18/2019  ? Pneumonia due to COVID-19 virus 07/19/2019  ? Seizure-like activity ([facility]) 07/14/2019  ? History of pulmonary embolism 02/17/2016  ? Pulmonary embolism affecting pregnancy 01/14/2016  ?  ?Allergies:  ?Allergies  ?Allergen Reactions  ? Azithromycin Anaphylaxis  ?  Amoxicillin Hives  ?  Has patient had a PCN reaction causing immediate rash, facial/tongue/throat swelling, SOB or lightheadedness with hypotension: No ?Has patient had a PCN reaction causing severe rash involving mucus membranes or skin necrosis: YEs ?Has patient had a PCN reaction that required hospitalization: No ?Has patient had a PCN reaction occurring within the last 10 years: No ?If all of the above answers are "NO", then may proceed with Cephalosporin use.   ? Clindamycin Hives  ? Ibuprofen Hives  ? Tramadol Hives  ? ?Medications:  ?Current Outpatient Medications:  ?  benzonatate (TESSALON) 100 MG capsule, Take 1 capsule (100 mg total) by mouth 3 (three) times daily as needed., Disp: 30 capsule, Rfl: 0 ?  brompheniramine-pseudoephedrine-DM 30-2-10 MG/5ML syrup, Take 5 mLs by mouth 4 (four) times daily as needed., Disp: 120 mL, Rfl: 0 ?  ipratropium (ATROVENT) 0.03 % nasal spray, Place 2 sprays into both nostrils every 12 (twelve) hours., Disp: 30 mL, Rfl: 0 ?  lidocaine (XYLOCAINE) 2 % solution, 72mL swallow every 4 hours as needed for sore throat, Disp: 100 mL, Rfl: 0 ?  acetaminophen (TYLENOL) 325 MG tablet, Take 2 tablets (650 mg total) by mouth every 6 (six) hours as needed., Disp: 56 tablet, Rfl: 0 ?  bacitracin 500 UNIT/GM ointment, Apply 1 application. topically 2 (two) times daily., Disp: 15 g, Rfl: 0 ?  cholecalciferol (VITAMIN D3) 25 MCG (1000 UNIT) tablet, Take 1,000 Units by mouth daily., Disp: , Rfl:  ?  ferrous sulfate 325 (65 FE) MG EC tablet, Take 325 mg by mouth daily., Disp: , Rfl:  ?  fluconazole (DIFLUCAN) 150 MG tablet, Take 1 tablet (150 mg total) by mouth daily. Take second dose 72 hours later if symptoms still persists., Disp: 2 tablet, Rfl: 0 ?  levETIRAcetam (KEPPRA) 1000 MG tablet, Take 1 tablet (1,000 mg total) by mouth 2 (two) times daily., Disp: 60 tablet, Rfl: 11 ?  SUMAtriptan (IMITREX) 50 MG tablet, Take 1 tablet PO at the start of the headache.  May repeat in 2 hours if no relief.  Max 2 tabs/24 hr., Disp: 10 tablet, Rfl: 2 ? ?Observations/Objective: ?Patient is well-developed, well-nourished in no acute distress.  ?Resting comfortably at home.  ?Head is normocephalic, atraumatic.  ?No labored breathing.  ?Speech is clear and coherent with logical content.  ?Patient is alert and oriented at baseline.  ? ? ?Assessment and Plan: ?1. Viral URI with cough ?- ipratropium (ATROVENT) 0.03 % nasal spray; Place 2 sprays into both nostrils every  12 (twelve) hours.  Dispense: 30 mL; Refill: 0 ?- benzonatate (TESSALON) 100 MG capsule; Take 1 capsule (100 mg total) by mouth 3 (three) times daily as needed.  Dispense: 30 capsule; Refill: 0 ?- lidocaine (XYLOCAINE) 2 % solution; 4mL swallow every 4 hours as needed for sore throat  Dispense: 100 mL; Refill: 0 ?- brompheniramine-pseudoephedrine-DM 30-2-10 MG/5ML syrup; Take 5 mLs by mouth 4 (four) times daily as needed.  Dispense: 120 mL; Refill: 0 ? ?- Suspect viral URI ?- Ipratropium, tessalon, Bromfed DM, and Viscous lidocaine prescribed ?- Continue symptomatic management of choice OTC ?- Push fluids ?- Steam and humidifier can help ?- Rest ?- Seek in person evaluation if not improving or if symptoms worsen ? ?Follow Up Instructions: ?I discussed the assessment and treatment plan with the patient. The patient was provided an opportunity to ask questions and all were answered. The patient agreed with the plan and demonstrated an understanding of the instructions.  A copy of instructions  were sent to the patient via MyChart unless otherwise noted below.  ? ? ?The patient was advised to call back or seek an in-person evaluation if the symptoms worsen or if the condition fails to improve as anticipated. ? ?Time:  ?I spent 12 minutes with the patient via telehealth technology discussing the above problems/concerns.   ? ?Mar Daring, PA-C ?

## 2021-08-04 NOTE — Progress Notes (Signed)
The patient no-showed for appointment despite this provider sending direct link, reaching out via phone with no response and waiting for at least 10 minutes from appointment time for patient to join. They will be marked as a NS for this appointment/time.   Tyan Dy M Teddy Pena, NP    

## 2021-08-04 NOTE — Patient Instructions (Signed)
?[name], thank you for joining [name], PA-C for today's virtual visit.  While this provider is not your primary care provider (PCP), if your PCP is located in our provider database this encounter information will be shared with them immediately following your visit. ? ?Consent: ?(Patient) Gabriella Montgomery provided verbal consent for this virtual visit at the beginning of the encounter. ? ?Current Medications: ? ?Current Outpatient Medications:  ?  benzonatate (TESSALON) 100 MG capsule, Take 1 capsule (100 mg total) by mouth 3 (three) times daily as needed., Disp: 30 capsule, Rfl: 0 ?  brompheniramine-pseudoephedrine-DM 30-2-10 MG/5ML syrup, Take 5 mLs by mouth 4 (four) times daily as needed., Disp: 120 mL, Rfl: 0 ?  ipratropium (ATROVENT) 0.03 % nasal spray, Place 2 sprays into both nostrils every 12 (twelve) hours., Disp: 30 mL, Rfl: 0 ?  lidocaine (XYLOCAINE) 2 % solution, 45mL swallow every 4 hours as needed for sore throat, Disp: 100 mL, Rfl: 0 ?  acetaminophen (TYLENOL) 325 MG tablet, Take 2 tablets (650 mg total) by mouth every 6 (six) hours as needed., Disp: 56 tablet, Rfl: 0 ?  bacitracin 500 UNIT/GM ointment, Apply 1 application. topically 2 (two) times daily., Disp: 15 g, Rfl: 0 ?  cholecalciferol (VITAMIN D3) 25 MCG (1000 UNIT) tablet, Take 1,000 Units by mouth daily., Disp: , Rfl:  ?  ferrous sulfate 325 (65 FE) MG EC tablet, Take 325 mg by mouth daily., Disp: , Rfl:  ?  fluconazole (DIFLUCAN) 150 MG tablet, Take 1 tablet (150 mg total) by mouth daily. Take second dose 72 hours later if symptoms still persists., Disp: 2 tablet, Rfl: 0 ?  levETIRAcetam (KEPPRA) 1000 MG tablet, Take 1 tablet (1,000 mg total) by mouth 2 (two) times daily., Disp: 60 tablet, Rfl: 11 ?  SUMAtriptan (IMITREX) 50 MG tablet, Take 1 tablet PO at the start of the headache.  May repeat in 2 hours if no relief.  Max 2 tabs/24 hr., Disp: 10 tablet, Rfl: 2  ? ?Medications ordered in this encounter:  ?Meds ordered  this encounter  ?Medications  ? ipratropium (ATROVENT) 0.03 % nasal spray  ?  Sig: Place 2 sprays into both nostrils every 12 (twelve) hours.  ?  Dispense:  30 mL  ?  Refill:  0  ?  Order Specific Question:   Supervising Provider  ?  Answer:   Eber Hong [[id number]]  ? benzonatate (TESSALON) 100 MG capsule  ?  Sig: Take 1 capsule (100 mg total) by mouth 3 (three) times daily as needed.  ?  Dispense:  30 capsule  ?  Refill:  0  ?  Order Specific Question:   Supervising Provider  ?  Answer:   Eber Hong [[id number]]  ? lidocaine (XYLOCAINE) 2 % solution  ?  Sig: 9mL swallow every 4 hours as needed for sore throat  ?  Dispense:  100 mL  ?  Refill:  0  ?  Order Specific Question:   Supervising Provider  ?  Answer:   Eber Hong [[id number]]  ? brompheniramine-pseudoephedrine-DM 30-2-10 MG/5ML syrup  ?  Sig: Take 5 mLs by mouth 4 (four) times daily as needed.  ?  Dispense:  120 mL  ?  Refill:  0  ?  Order Specific Question:   Supervising Provider  ?  Answer:   Eber Hong [[id number]]  ?  ? ?*If you need refills on other medications prior to your next appointment, please contact your pharmacy* ? ?Follow-Up: ?Call back or  seek an in-person evaluation if the symptoms worsen or if the condition fails to improve as anticipated. ? ?Other Instructions ? ?Viral Respiratory Infection ?A viral respiratory infection is an illness that affects parts of the body that are used for breathing. These include the lungs, nose, and throat. It is caused by a germ called a virus. ?Some examples of this kind of infection are: ?A cold. ?The flu (influenza). ?A respiratory syncytial virus (RSV) infection. ?What are the causes? ?This condition is caused by a virus. It spreads from person to person. You can get the virus if: ?You breathe in droplets from someone who is sick. ?You come in contact with people who are sick. ?You touch mucus or other fluid from a person who is sick. ?What are the signs or symptoms? ?Symptoms of this condition include: ?A stuffy  or runny nose. ?A sore throat. ?A cough. ?Shortness of breath. ?Trouble breathing. ?Yellow or green fluid in the nose. ?Other symptoms may include: ?A fever. ?Sweating or chills. ?Tiredness (fatigue). ?Achy muscles. ?A headache. ?How is this treated? ?This condition may be treated with: ?Medicines that treat viruses. ?Medicines that make it easy to breathe. ?Medicines that are sprayed into the nose. ?Acetaminophen or NSAIDs, such as ibuprofen, to treat fever. ?Follow these instructions at home: ?Managing pain and congestion ?Take over-the-counter and prescription medicines only as told by your doctor. ?If you have a sore throat, gargle with salt water. Do this 3-4 times a day or as needed. ?To make salt water, dissolve ?-1 tsp (3-6 g) of salt in 1 cup (237 mL) of warm water. Make sure that all the salt dissolves. ?Use nose drops made from salt water. This helps with stuffiness (congestion). It also helps soften the skin around your nose. ?Take 2 tsp (10 mL) of honey at bedtime to lessen coughing at night. ?Do not give honey to children who are younger than 34 year old. ?Drink enough fluid to keep your pee (urine) pale yellow. ?General instructions ? ?Rest as much as possible. ?Do not drink alcohol. ?Do not smoke or use any products that contain nicotine or tobacco. If you need help quitting, ask your doctor. ?Keep all follow-up visits. ?How is this prevented? ?  ?Get a flu shot every year. Ask your doctor when you should get your flu shot. ?Do not let other people get your germs. If you are sick: ?Wash your hands with soap and water often. Wash your hands after you cough or sneeze. Wash hands for at least 20 seconds. If you cannot use soap and water, use hand sanitizer. ?Cover your mouth when you cough. Cover your nose and mouth when you sneeze. ?Do not share cups or eating utensils. ?Clean commonly used objects often. Clean commonly touched surfaces. ?Stay home from work or school. ?Avoid contact with people who are  sick during cold and flu season. This is in fall and winter. ?Get help if: ?Your symptoms last for 10 days or longer. ?Your symptoms get worse over time. ?You have very bad pain in your face or forehead. ?Parts of your jaw or neck get very swollen. ?You have shortness of breath. ?Get help right away if: ?You feel pain or pressure in your chest. ?You have trouble breathing. ?You faint or feel like you will faint. ?You keep vomiting and it gets worse. ?You feel confused. ?These symptoms may be an emergency. Get help right away. Call your local emergency services (911 in the [place] ?Do not wait to see if the symptoms  will go away. ?Do not drive yourself to the hospital. ?Summary ?A viral respiratory infection is an illness that affects parts of the body that are used for breathing. ?Examples of this illness include a cold, the flu, and a respiratory syncytial virus (RSV) infection. ?The infection can cause a runny nose, cough, sore throat, and fever. ?Follow what your doctor tells you about taking medicines, drinking lots of fluid, washing your hands, resting at home, and avoiding people who are sick. ?This information is not intended to replace advice given to you by your health care provider. Make sure you discuss any questions you have with your health care provider. ?Document Revised: 07/18/2020 Document Reviewed: 07/18/2020 ?Elsevier Patient Education ? 2022 [employer] ? ? ? ?If you have been instructed to have an in-person evaluation today at a local Urgent Care facility, please use the link below. It will take you to a list of all of our available [facility], including address, phone number and hours of operation. Please do not delay care.  ?[facility] ? ?If you or a family member do not have a primary care provider, use the link below to schedule a visit and establish care. When you choose a [facility] primary care physician or advanced practice provider, you gain a long-term  partner in health. ?Find a Primary Care Provider ? ?Learn more about [facility]'s in-office and virtual care options: ?[facility] - Get Care Now ?

## 2021-08-05 ENCOUNTER — Telehealth: Payer: Medicaid Other | Admitting: Emergency Medicine

## 2021-08-05 DIAGNOSIS — J069 Acute upper respiratory infection, unspecified: Secondary | ICD-10-CM

## 2021-08-05 NOTE — Progress Notes (Signed)
?Virtual Visit Consent  ? ?[name], you are scheduled for a virtual visit with a [facility] provider today.   ?  ?Just as with appointments in the office, your consent must be obtained to participate.  Your consent will be active for this visit and any virtual visit you may have with one of our providers in the next 365 days.   ?  ?If you have a MyChart account, a copy of this consent can be sent to you electronically.  All virtual visits are billed to your insurance company just like a traditional visit in the office.   ? ?As this is a virtual visit, video technology does not allow for your provider to perform a traditional examination.  This may limit your provider's ability to fully assess your condition.  If your provider identifies any concerns that need to be evaluated in person or the need to arrange testing (such as labs, EKG, etc.), we will make arrangements to do so.   ?  ?Although advances in technology are sophisticated, we cannot ensure that it will always work on either your end or our end.  If the connection with a video visit is poor, the visit may have to be switched to a telephone visit.  With either a video or telephone visit, we are not always able to ensure that we have a secure connection.    ? ?I need to obtain your verbal consent now.   Are you willing to proceed with your visit today?  ?  ?COY VANDOREN has provided verbal consent on 08/05/2021 for a virtual visit (video or telephone). ?  ?Roxy Horseman, PA-C  ? ?Date: 08/05/2021 12:16 PM ? ? ?Virtual Visit via Video Note  ? ?IRoxy Horseman, connected with  KEVIA ZAUCHA  ([id number], 12/07/1985) on 08/05/21 at 12:15 PM EDT by a video-enabled telemedicine application and verified that I am speaking with the correct person using two identifiers. ? ?Location: ?Patient: Virtual Visit Location Patient: Mobile ?Provider: Virtual Visit Location Provider: Home Office ?  ?I discussed the limitations of evaluation and management by  telemedicine and the availability of in person appointments. The patient expressed understanding and agreed to proceed.   ? ?History of Present Illness: ?Gabriella Montgomery is a 36 y.o. who identifies as a female who was assigned female at birth, and is being seen today for sore throat.  States that the pain started 2 days ago.  She was seen yesterday for the same.  States that she is still having persistent symptoms including sore throat and nasal discharge.  States that she hasn't noticed any relief with the medications that have been prescribed. . ? ?HPI: HPI  ?Problems:  ?Patient Active Problem List  ? Diagnosis Date Noted  ? Vitamin D deficiency 10/12/2019  ? Seizure disorder (HCC) 08/18/2019  ? Migraine without aura and without status migrainosus, not intractable 08/18/2019  ? Class 3 severe obesity due to excess calories without serious comorbidity with body mass index (BMI) of 45.0 to 49.9 in adult [facility]) 08/18/2019  ? Normochromic anemia 08/18/2019  ? Pneumonia due to COVID-19 virus 07/19/2019  ? Seizure-like activity (HCC) 07/14/2019  ? History of pulmonary embolism 02/17/2016  ? Pulmonary embolism affecting pregnancy 01/14/2016  ?  ?Allergies:  ?Allergies  ?Allergen Reactions  ? Azithromycin Anaphylaxis  ? Amoxicillin Hives  ?  Has patient had a PCN reaction causing immediate rash, facial/tongue/throat swelling, SOB or lightheadedness with hypotension: No ?Has patient had a PCN reaction  causing severe rash involving mucus membranes or skin necrosis: YEs ?Has patient had a PCN reaction that required hospitalization: No ?Has patient had a PCN reaction occurring within the last 10 years: No ?If all of the above answers are "NO", then may proceed with Cephalosporin use.  ? Clindamycin Hives  ? Ibuprofen Hives  ? Tramadol Hives  ? ?Medications:  ?Current Outpatient Medications:  ?  acetaminophen (TYLENOL) 325 MG tablet, Take 2 tablets (650 mg total) by mouth every 6 (six) hours as needed., Disp: 56 tablet, Rfl:  0 ?  bacitracin 500 UNIT/GM ointment, Apply 1 application. topically 2 (two) times daily., Disp: 15 g, Rfl: 0 ?  benzonatate (TESSALON) 100 MG capsule, Take 1 capsule (100 mg total) by mouth 3 (three) times daily as needed., Disp: 30 capsule, Rfl: 0 ?  brompheniramine-pseudoephedrine-DM 30-2-10 MG/5ML syrup, Take 5 mLs by mouth 4 (four) times daily as needed., Disp: 120 mL, Rfl: 0 ?  cholecalciferol (VITAMIN D3) 25 MCG (1000 UNIT) tablet, Take 1,000 Units by mouth daily., Disp: , Rfl:  ?  ferrous sulfate 325 (65 FE) MG EC tablet, Take 325 mg by mouth daily., Disp: , Rfl:  ?  fluconazole (DIFLUCAN) 150 MG tablet, Take 1 tablet (150 mg total) by mouth daily. Take second dose 72 hours later if symptoms still persists., Disp: 2 tablet, Rfl: 0 ?  ipratropium (ATROVENT) 0.03 % nasal spray, Place 2 sprays into both nostrils every 12 (twelve) hours., Disp: 30 mL, Rfl: 0 ?  levETIRAcetam (KEPPRA) 1000 MG tablet, Take 1 tablet (1,000 mg total) by mouth 2 (two) times daily., Disp: 60 tablet, Rfl: 11 ?  lidocaine (XYLOCAINE) 2 % solution, 28mL swallow every 4 hours as needed for sore throat, Disp: 100 mL, Rfl: 0 ?  SUMAtriptan (IMITREX) 50 MG tablet, Take 1 tablet PO at the start of the headache.  May repeat in 2 hours if no relief.  Max 2 tabs/24 hr., Disp: 10 tablet, Rfl: 2 ? ?Observations/Objective: ?Patient is well-developed, well-nourished in no acute distress.  ?Patient is mobile in a vehicle  ?Head is normocephalic, atraumatic.  ?No labored breathing.  ?Speech is clear and coherent with logical content.  ?Patient is alert and oriented at baseline.  ? ? ?Assessment and Plan: ?1. Viral upper respiratory tract infection ? ? - Continue therapies that were prescribed yesterday. ? ?Patient asks about strep throat.  I recommended that she be seen at [facility] for strep testing if she desires, but her symptoms seem more consistent with URI than strep throat. ? ?Follow Up Instructions: ?I discussed the assessment and treatment plan with  the patient. The patient was provided an opportunity to ask questions and all were answered. The patient agreed with the plan and demonstrated an understanding of the instructions.  A copy of instructions were sent to the patient via MyChart unless otherwise noted below.  ? ? ? ?The patient was advised to call back or seek an in-person evaluation if the symptoms worsen or if the condition fails to improve as anticipated. ? ?Time:  ?I spent 12 minutes with the patient via telehealth technology discussing the above problems/concerns.   ? ?Roxy Horseman, PA-C ? ?

## 2021-08-05 NOTE — Patient Instructions (Signed)
Continue treatments prescribed yesterday.  Keep in mind, your symptoms will likely last 7-10 days and will probably worsen some before they begin to improve.  If you want to be tested for strep throat, you can go to one of the following urgent care centers for testing. ? ?Based on what you shared with me, I feel your condition warrants further evaluation and I recommend that you be seen in a face to face visit. ? ?  ? For an urgent face to face visit, [facility] has six urgent care centers for your convenience:  ?  ? [facility] ?Get Driving Directions ?[phone] ?[phone] ?[place], [place] [address] ?  ? [facility]) ?Get Driving Directions ?[phone] ?[address] ?[place], [place] [address] ? ?[facility]) ?Get Driving Directions ?[phone] ?[address] ?[place],  [place]  [address] ? ?[facility] ?Get Driving Directions ?[phone] ?[address] ?[place], [place] [address] ?  ?[facility] [place] ?Get Driving Directions  ?[phone] ?[address] ?[address] ?[place], [place] [address] ?  ?[facility] ?Get Driving Directions ?[phone] ?[address] ?[place], [place] [address] ? ? ?  ? ?

## 2021-08-15 ENCOUNTER — Telehealth: Payer: Medicaid Other | Admitting: Physician Assistant

## 2021-08-15 DIAGNOSIS — J039 Acute tonsillitis, unspecified: Secondary | ICD-10-CM

## 2021-08-15 DIAGNOSIS — Z91199 Patient's noncompliance with other medical treatment and regimen due to unspecified reason: Secondary | ICD-10-CM

## 2021-08-15 MED ORDER — PREDNISONE 20 MG PO TABS: 40.0000 mg | ORAL_TABLET | Freq: Every day | ORAL | 0 refills | Status: DC

## 2021-08-15 MED ORDER — CEFDINIR 300 MG PO CAPS: 300.0000 mg | ORAL_CAPSULE | Freq: Two times a day (BID) | ORAL | 0 refills | Status: DC

## 2021-08-15 NOTE — Patient Instructions (Signed)
?[name], thank you for joining [name], PA-C for today's virtual visit.  While this provider is not your primary care provider (PCP), if your PCP is located in our provider database this encounter information will be shared with them immediately following your visit. ? ?Consent: ?(Patient) Gabriella Montgomery provided verbal consent for this virtual visit at the beginning of the encounter. ? ?Current Medications: ? ?Current Outpatient Medications:  ?  acetaminophen (TYLENOL) 325 MG tablet, Take 2 tablets (650 mg total) by mouth every 6 (six) hours as needed., Disp: 56 tablet, Rfl: 0 ?  bacitracin 500 UNIT/GM ointment, Apply 1 application. topically 2 (two) times daily., Disp: 15 g, Rfl: 0 ?  benzonatate (TESSALON) 100 MG capsule, Take 1 capsule (100 mg total) by mouth 3 (three) times daily as needed., Disp: 30 capsule, Rfl: 0 ?  brompheniramine-pseudoephedrine-DM 30-2-10 MG/5ML syrup, Take 5 mLs by mouth 4 (four) times daily as needed., Disp: 120 mL, Rfl: 0 ?  cholecalciferol (VITAMIN D3) 25 MCG (1000 UNIT) tablet, Take 1,000 Units by mouth daily., Disp: , Rfl:  ?  ferrous sulfate 325 (65 FE) MG EC tablet, Take 325 mg by mouth daily., Disp: , Rfl:  ?  fluconazole (DIFLUCAN) 150 MG tablet, Take 1 tablet (150 mg total) by mouth daily. Take second dose 72 hours later if symptoms still persists., Disp: 2 tablet, Rfl: 0 ?  ipratropium (ATROVENT) 0.03 % nasal spray, Place 2 sprays into both nostrils every 12 (twelve) hours., Disp: 30 mL, Rfl: 0 ?  levETIRAcetam (KEPPRA) 1000 MG tablet, Take 1 tablet (1,000 mg total) by mouth 2 (two) times daily., Disp: 60 tablet, Rfl: 11 ?  lidocaine (XYLOCAINE) 2 % solution, 95mL swallow every 4 hours as needed for sore throat, Disp: 100 mL, Rfl: 0 ?  SUMAtriptan (IMITREX) 50 MG tablet, Take 1 tablet PO at the start of the headache.  May repeat in 2 hours if no relief.  Max 2 tabs/24 hr., Disp: 10 tablet, Rfl: 2  ? ?Medications ordered in this encounter:  ?No orders of  the defined types were placed in this encounter. ?  ? ?*If you need refills on other medications prior to your next appointment, please contact your pharmacy* ? ?Follow-Up: ?Call back or seek an in-person evaluation if the symptoms worsen or if the condition fails to improve as anticipated. ? ?Other Instructions ?Please keep well-hydrated and get plenty of rest. ?Start salt-water gargles. ?You can continue Tylenol for throat pain. ?Take the steroid as directed. ?Take the antibiotic as directed. ?You should start to notice improvement within 48 hours and then things should continue to improve until resolved.  ?If not, I want you to be evaluated in person. ?If you develop any high fever, true difficulty swallowing, shortness of breath or change in voice -- ER.  ? ? ?If you have been instructed to have an in-person evaluation today at a local Urgent Care facility, please use the link below. It will take you to a list of all of our available [facility], including address, phone number and hours of operation. Please do not delay care.  ?[facility] ? ?If you or a family member do not have a primary care provider, use the link below to schedule a visit and establish care. When you choose a [facility] primary care physician or advanced practice provider, you gain a long-term partner in health. ?Find a Primary Care Provider ? ?Learn more about [facility] in-office and virtual  care options: ?[facility]  ?

## 2021-08-15 NOTE — Progress Notes (Signed)
The patient no-showed for appointment despite this provider sending an additional direct link and waiting for at least 10 minutes from appointment time for patient to join. They will be marked as a NS for this appointment/time.  ? ?Leeanne Rio, PA-C ? ? ? ?

## 2021-08-15 NOTE — Progress Notes (Signed)
?Virtual Visit Consent  ? ?[name], you are scheduled for a virtual visit with a [facility] provider today.   ?  ?Just as with appointments in the office, your consent must be obtained to participate.  Your consent will be active for this visit and any virtual visit you may have with one of our providers in the next 365 days.   ?  ?If you have a MyChart account, a copy of this consent can be sent to you electronically.  All virtual visits are billed to your insurance company just like a traditional visit in the office.   ? ?As this is a virtual visit, video technology does not allow for your provider to perform a traditional examination.  This may limit your provider's ability to fully assess your condition.  If your provider identifies any concerns that need to be evaluated in person or the need to arrange testing (such as labs, EKG, etc.), we will make arrangements to do so.   ?  ?Although advances in technology are sophisticated, we cannot ensure that it will always work on either your end or our end.  If the connection with a video visit is poor, the visit may have to be switched to a telephone visit.  With either a video or telephone visit, we are not always able to ensure that we have a secure connection.    ? ?Also, by engaging in this virtual visit, you consent to the provision of healthcare. Additionally, you authorize for your insurance to be billed (if applicable) for the services provided during this visit.  ? ?I need to obtain your verbal consent now.   Are you willing to proceed with your visit today?  ?  ?KEERAT DENICOLA has provided verbal consent on 08/15/2021 for a virtual visit (video or telephone). ?  ?[name], PA-C  ? ?Date: 08/15/2021 11:31 AM ? ? ?Virtual Visit via Video Note  ? ?IPiedad Climes, connected with  Gabriella Montgomery  ([id number], [birth year]) on 08/15/21 at 11:15 AM EDT by a video-enabled telemedicine application and verified that I am speaking with the  correct person using two identifiers. ? ?Location: ?Patient: Virtual Visit Location Patient: Home ?Provider: Virtual Visit Location Provider: Home Office ?  ?I discussed the limitations of evaluation and management by telemedicine and the availability of in person appointments. The patient expressed understanding and agreed to proceed.   ? ?History of Present Illness: ?Gabriella Montgomery is a 36 y.o. who identifies as a female who was assigned female at birth, and is being seen today for continued and progressing throat pain despite treatment for viral URI. Notes head congestion is still present but improved. Throat continues to progress now with bilateral throat pain R>L and exudate at back of throat. Notes swollen and tender lymph nodes of her neck as well. Denies any difficulty swallowing but notes painful swallowing. Denies change in speech or breathing. Is in [place] visiting her sister so cannot get in with her PCP. ? ?HPI: HPI  ?Problems:  ?Patient Active Problem List  ? Diagnosis Date Noted  ? Vitamin D deficiency 10/12/2019  ? Seizure disorder (HCC) 08/18/2019  ? Migraine without aura and without status migrainosus, not intractable 08/18/2019  ? Class 3 severe obesity due to excess calories without serious comorbidity with body mass index (BMI) of 45.0 to 49.9 in adult [facility]) 08/18/2019  ? Normochromic anemia 08/18/2019  ? Pneumonia due to COVID-19 virus 07/19/2019  ? Seizure-like activity (HCC)  07/14/2019  ? History of pulmonary embolism 02/17/2016  ? Pulmonary embolism affecting pregnancy 01/14/2016  ?  ?Allergies:  ?Allergies  ?Allergen Reactions  ? Azithromycin Anaphylaxis  ? Amoxicillin Hives  ?  Has patient had a PCN reaction causing immediate rash, facial/tongue/throat swelling, SOB or lightheadedness with hypotension: No ?Has patient had a PCN reaction causing severe rash involving mucus membranes or skin necrosis: YEs ?Has patient had a PCN reaction that required hospitalization: No ?Has patient  had a PCN reaction occurring within the last 10 years: No ?If all of the above answers are "NO", then may proceed with Cephalosporin use.  ? Clindamycin Hives  ? Ibuprofen Hives  ? Tramadol Hives  ? ?Medications:  ?Current Outpatient Medications:  ?  cefdinir (OMNICEF) 300 MG capsule, Take 1 capsule (300 mg total) by mouth 2 (two) times daily., Disp: 20 capsule, Rfl: 0 ?  predniSONE (DELTASONE) 20 MG tablet, Take 2 tablets (40 mg total) by mouth daily with breakfast., Disp: 10 tablet, Rfl: 0 ?  acetaminophen (TYLENOL) 325 MG tablet, Take 2 tablets (650 mg total) by mouth every 6 (six) hours as needed., Disp: 56 tablet, Rfl: 0 ?  bacitracin 500 UNIT/GM ointment, Apply 1 application. topically 2 (two) times daily., Disp: 15 g, Rfl: 0 ?  benzonatate (TESSALON) 100 MG capsule, Take 1 capsule (100 mg total) by mouth 3 (three) times daily as needed., Disp: 30 capsule, Rfl: 0 ?  brompheniramine-pseudoephedrine-DM 30-2-10 MG/5ML syrup, Take 5 mLs by mouth 4 (four) times daily as needed., Disp: 120 mL, Rfl: 0 ?  cholecalciferol (VITAMIN D3) 25 MCG (1000 UNIT) tablet, Take 1,000 Units by mouth daily., Disp: , Rfl:  ?  ferrous sulfate 325 (65 FE) MG EC tablet, Take 325 mg by mouth daily., Disp: , Rfl:  ?  fluconazole (DIFLUCAN) 150 MG tablet, Take 1 tablet (150 mg total) by mouth daily. Take second dose 72 hours later if symptoms still persists., Disp: 2 tablet, Rfl: 0 ?  ipratropium (ATROVENT) 0.03 % nasal spray, Place 2 sprays into both nostrils every 12 (twelve) hours., Disp: 30 mL, Rfl: 0 ?  levETIRAcetam (KEPPRA) 1000 MG tablet, Take 1 tablet (1,000 mg total) by mouth 2 (two) times daily., Disp: 60 tablet, Rfl: 11 ?  lidocaine (XYLOCAINE) 2 % solution, 5mL swallow every 4 hours as needed for sore throat, Disp: 100 mL, Rfl: 0 ? ?Observations/Objective: ?Patient is well-developed, well-nourished in no acute distress.  ?Resting comfortably at home.  ?Head is normocephalic, atraumatic.  ?No labored breathing. ?Speech is clear  and coherent with logical content.  ?Patient is alert and oriented at baseline.  ?Significant oropharyngeal erythema with tonsillar enlargement and exudate. Uvula is midline. No tongue lesions or swelling noted on visual exam.  ? ?Assessment and Plan: ?1. Exudative tonsillitis ?- predniSONE (DELTASONE) 20 MG tablet; Take 2 tablets (40 mg total) by mouth daily with breakfast.  Dispense: 10 tablet; Refill: 0 ?- cefdinir (OMNICEF) 300 MG capsule; Take 1 capsule (300 mg total) by mouth 2 (two) times daily.  Dispense: 20 capsule; Refill: 0 ? ?Allergic to Penicillins and Azithromycin but tolerates Cephalosporins. Will start Cefdinir 300 mg BID x 10 days. Also start prednisone to reduce pain and swelling. Continue Tylenol OTC and chloraseptic spray. Supportive measures reviewed. If anything worsening despite treatment, she will have to be seen in person for further evaluation. ER precautions reviewed and she voiced understanding and agreement with this.  ? ?Follow Up Instructions: ?I discussed the assessment and treatment plan with the patient.  The patient was provided an opportunity to ask questions and all were answered. The patient agreed with the plan and demonstrated an understanding of the instructions.  A copy of instructions were sent to the patient via MyChart unless otherwise noted below.  ? ?The patient was advised to call back or seek an in-person evaluation if the symptoms worsen or if the condition fails to improve as anticipated. ? ?Time:  ?I spent 10 minutes with the patient via telehealth technology discussing the above problems/concerns.   ? ?[name], PA-C ?

## 2021-10-08 ENCOUNTER — Telehealth: Payer: Medicaid Other | Admitting: Physician Assistant

## 2021-10-08 DIAGNOSIS — G4489 Other headache syndrome: Secondary | ICD-10-CM | POA: Diagnosis not present

## 2021-10-08 DIAGNOSIS — M545 Low back pain, unspecified: Secondary | ICD-10-CM | POA: Diagnosis not present

## 2021-10-08 MED ORDER — PREDNISONE 20 MG PO TABS: 40.0000 mg | ORAL_TABLET | Freq: Every day | ORAL | 0 refills | Status: DC

## 2021-10-08 NOTE — Progress Notes (Signed)
Patient was instructed earlier after no-showing for first appointment that she need evaluated at local ER giving new onset seizure and severe headache and being out of the area (in [place]) to get in with her Neurologist.

## 2021-10-08 NOTE — Progress Notes (Signed)
The patient no-showed for appointment despite this provider sending direct link x 2 with no response and waiting for at least 10 minutes from appointment time for patient to join. They will be marked as a NS for this appointment/time.   Robley Matassa M Matthan Sledge, PA-C    

## 2021-10-08 NOTE — Patient Instructions (Signed)
Gabriella Montgomery, thank you for joining Mar Daring, PA-C for today's virtual visit.  While this provider is not your primary care provider (PCP), if your PCP is located in our provider database this encounter information will be shared with them immediately following your visit.  Consent: (Patient) Gabriella Montgomery provided verbal consent for this virtual visit at the beginning of the encounter.  Current Medications:  Current Outpatient Medications:    predniSONE (DELTASONE) 20 MG tablet, Take 2 tablets (40 mg total) by mouth daily with breakfast., Disp: 10 tablet, Rfl: 0   bacitracin 500 UNIT/GM ointment, Apply 1 application. topically 2 (two) times daily., Disp: 15 g, Rfl: 0   cholecalciferol (VITAMIN D3) 25 MCG (1000 UNIT) tablet, Take 1,000 Units by mouth daily., Disp: , Rfl:    ferrous sulfate 325 (65 FE) MG EC tablet, Take 325 mg by mouth daily., Disp: , Rfl:    ipratropium (ATROVENT) 0.03 % nasal spray, Place 2 sprays into both nostrils every 12 (twelve) hours., Disp: 30 mL, Rfl: 0   levETIRAcetam (KEPPRA) 1000 MG tablet, Take 1 tablet (1,000 mg total) by mouth 2 (two) times daily., Disp: 60 tablet, Rfl: 11   lidocaine (XYLOCAINE) 2 % solution, 5mL swallow every 4 hours as needed for sore throat, Disp: 100 mL, Rfl: 0   Medications ordered in this encounter:  Meds ordered this encounter  Medications   predniSONE (DELTASONE) 20 MG tablet    Sig: Take 2 tablets (40 mg total) by mouth daily with breakfast.    Dispense:  10 tablet    Refill:  0    Order Specific Question:   Supervising Provider    Answer:   Noemi Chapel [[id number]]     *If you need refills on other medications prior to your next appointment, please contact your pharmacy*  Follow-Up: Call back or seek an in-person evaluation if the symptoms worsen or if the condition fails to improve as anticipated.  Other Instructions Seizure, Adult A seizure is a sudden burst of abnormal electrical and chemical activity in  the brain. Seizures usually last from 30 seconds to 2 minutes.  What are the causes? Common causes of this condition include: Fever or infection. Problems that affect the brain. These may include: A brain or head injury. Bleeding in the brain. A brain tumor. Low levels of blood sugar or salt. Kidney problems or liver problems. Conditions that are passed from parent to child (are inherited). Problems with a substance, such as: Having a reaction to a drug or a medicine. Stopping the use of a substance all of a sudden (withdrawal). A stroke. Disorders that affect how you develop. Sometimes, the cause may not be known.  What increases the risk? Having someone in your family who has epilepsy. In this condition, seizures happen again and again over time. They have no clear cause. Having had a tonic-clonic seizure before. This type of seizure causes you to: Tighten the muscles of the whole body. Lose consciousness. Having had a head injury or strokes before. Having had a lack of oxygen at birth. What are the signs or symptoms? There are many types of seizures. The symptoms vary depending on the type of seizure you have. Symptoms during a seizure Shaking that you cannot control (convulsions) with fast, jerky movements of muscles. Stiffness of the body. Breathing problems. Feeling mixed up (confused). Staring or not responding to sound or touch. Head nodding. Eyes that blink, flutter, or move fast. Drooling, grunting, or making clicking sounds  with your mouth Losing control of when you pee or poop. Symptoms before a seizure Feeling afraid, nervous, or worried. Feeling like you may vomit. Feeling like: You are moving when you are not. Things around you are moving when they are not. Feeling like you saw or heard something before (dj vu). Odd tastes or smells. Changes in how you see. You may see flashing lights or spots. Symptoms after a seizure Feeling confused. Feeling  sleepy. Headache. Sore muscles. How is this treated? If your seizure stops on its own, you will not need treatment. If your seizure lasts longer than 5 minutes, you will normally need treatment. Treatment may include: Medicines given through an IV tube. Avoiding things, such as medicines, that are known to cause your seizures. Medicines to prevent seizures. A device to prevent or control seizures. Surgery. A diet low in carbohydrates and high in fat (ketogenic diet). Follow these instructions at home: Medicines Take over-the-counter and prescription medicines only as told by your doctor. Avoid foods or drinks that may keep your medicine from working, such as alcohol. Activity Follow instructions about driving, swimming, or doing things that would be dangerous if you had another seizure. Wait until your doctor says it is safe for you to do these things. If you live in the [place] ask your local department of motor vehicles when you can drive. Get a lot of rest. Teaching others  Teach friends and family what to do when you have a seizure. They should: Help you get down to the ground. Protect your head and body. Loosen any clothing around your neck. Turn you on your side. Know whether or not you need emergency care. Stay with you until you are better. Also, tell them what not to do if you have a seizure. Tell them: They should not hold you down. They should not put anything in your mouth. General instructions Avoid anything that gives you seizures. Keep a seizure diary. Write down: What you remember about each seizure. What you think caused each seizure. Keep all follow-up visits. Contact a doctor if: You have another seizure or seizures. Call the doctor each time you have a seizure. The pattern of your seizures changes. You keep having seizures with treatment. You have symptoms of being sick or having an infection. You are not able to take your medicine. Get help right away  if: You have any of these problems: A seizure that lasts longer than 5 minutes. Many seizures in a row and you do not feel better between seizures. A seizure that makes it harder to breathe. A seizure and you can no longer speak or use part of your body. You do not wake up right after a seizure. You get hurt during a seizure. You feel confused or have pain right after a seizure. These symptoms may be an emergency. Get help right away. Call your local emergency services (911 in the [place] Do not wait to see if the symptoms will go away. Do not drive yourself to the hospital. Summary A seizure is a sudden burst of abnormal electrical and chemical activity in the brain. Seizures normally last from 30 seconds to 2 minutes. Causes of seizures include illness, injury to the head, low levels of blood sugar or salt, and certain conditions. Most seizures will stop on their own in less than 5 minutes. Seizures that last longer than 5 minutes are a medical emergency and need treatment right away. Many medicines are used to treat seizures. Take over-the-counter and  prescription medicines only as told by your doctor. This information is not intended to replace advice given to you by your health care provider. Make sure you discuss any questions you have with your health care provider. Document Revised: 10/20/2019 Document Reviewed: 10/20/2019 Elsevier Patient Education  [place].    If you have been instructed to have an in-person evaluation today at a local Urgent Care facility, please use the link below. It will take you to a list of all of our available [facility], including address, phone number and hours of operation. Please do not delay care.  [facility]  If you or a family member do not have a primary care provider, use the link below to schedule a visit and establish care. When you choose a [facility] primary care physician or advanced practice provider,  you gain a long-term partner in health. Find a Primary Care Provider  Learn more about [facility] in-office and virtual care options: [facility]

## 2021-10-08 NOTE — Progress Notes (Signed)
Virtual Visit Consent   Gabriella Montgomery, you are scheduled for a virtual visit with a [facility] provider today. Just as with appointments in the office, your consent must be obtained to participate. Your consent will be active for this visit and any virtual visit you may have with one of our providers in the next 365 days. If you have a MyChart account, a copy of this consent can be sent to you electronically.  As this is a virtual visit, video technology does not allow for your provider to perform a traditional examination. This may limit your provider's ability to fully assess your condition. If your provider identifies any concerns that need to be evaluated in person or the need to arrange testing (such as labs, EKG, etc.), we will make arrangements to do so. Although advances in technology are sophisticated, we cannot ensure that it will always work on either your end or our end. If the connection with a video visit is poor, the visit may have to be switched to a telephone visit. With either a video or telephone visit, we are not always able to ensure that we have a secure connection.  By engaging in this virtual visit, you consent to the provision of healthcare and authorize for your insurance to be billed (if applicable) for the services provided during this visit. Depending on your insurance coverage, you may receive a charge related to this service.  I need to obtain your verbal consent now. Are you willing to proceed with your visit today? Gabriella Montgomery has provided verbal consent on 10/08/2021 for a virtual visit (video or telephone). [name], PA-C  Date: 10/08/2021 4:33 PM  Virtual Visit via Video Note   I, [name], connected with  Gabriella Montgomery  ([id number], 28-Nov-1985) on 10/08/21 at  4:30 PM EDT by a video-enabled telemedicine application and verified that I am speaking with the correct person using two identifiers.  Location: Patient: Virtual Visit  Location Patient: Home Provider: Virtual Visit Location Provider: Home Office   I discussed the limitations of evaluation and management by telemedicine and the availability of in person appointments. The patient expressed understanding and agreed to proceed.    History of Present Illness: Gabriella Montgomery is a 36 y.o. who identifies as a female who was assigned female at birth, and is being seen today for postictal headache and back pain. Reports she had a seizure this morning. No showed a VV with [place] at 11:15. Rescheduled at 1:45 and was told she should be seen in person. Patient states she went to ER and had MRI, that they refilled her Keppra, but did not give her medication for a headache. She has allergy to NSAIDs. Taking tylenol without relief.   *Of note: Patient states an ER visit today, but last ER note available in [facility] was from 05/2021 at [facility]. No MRI for any date in 2023 available.    Problems:  Patient Active Problem List   Diagnosis Date Noted   Vitamin D deficiency 10/12/2019   Seizure disorder (HCC) 08/18/2019   Migraine without aura and without status migrainosus, not intractable 08/18/2019   Class 3 severe obesity due to excess calories without serious comorbidity with body mass index (BMI) of 45.0 to 49.9 in adult [facility]) 08/18/2019   Normochromic anemia 08/18/2019   Pneumonia due to COVID-19 virus 07/19/2019   Seizure-like activity (HCC) 07/14/2019   History of pulmonary embolism 02/17/2016   Pulmonary embolism affecting pregnancy  01/14/2016    Allergies:  Allergies  Allergen Reactions   Azithromycin Anaphylaxis   Amoxicillin Hives    Has patient had a PCN reaction causing immediate rash, facial/tongue/throat swelling, SOB or lightheadedness with hypotension: No Has patient had a PCN reaction causing severe rash involving mucus membranes or skin necrosis: YEs Has patient had a PCN reaction that required hospitalization: No Has patient had a PCN  reaction occurring within the last 10 years: No If all of the above answers are "NO", then may proceed with Cephalosporin use.   Clindamycin Hives   Ibuprofen Hives   Tramadol Hives   Medications:  Current Outpatient Medications:    predniSONE (DELTASONE) 20 MG tablet, Take 2 tablets (40 mg total) by mouth daily with breakfast., Disp: 10 tablet, Rfl: 0   acetaminophen (TYLENOL) 325 MG tablet, Take 2 tablets (650 mg total) by mouth every 6 (six) hours as needed., Disp: 56 tablet, Rfl: 0   bacitracin 500 UNIT/GM ointment, Apply 1 application. topically 2 (two) times daily., Disp: 15 g, Rfl: 0   benzonatate (TESSALON) 100 MG capsule, Take 1 capsule (100 mg total) by mouth 3 (three) times daily as needed., Disp: 30 capsule, Rfl: 0   brompheniramine-pseudoephedrine-DM 30-2-10 MG/5ML syrup, Take 5 mLs by mouth 4 (four) times daily as needed., Disp: 120 mL, Rfl: 0   cefdinir (OMNICEF) 300 MG capsule, Take 1 capsule (300 mg total) by mouth 2 (two) times daily., Disp: 20 capsule, Rfl: 0   cholecalciferol (VITAMIN D3) 25 MCG (1000 UNIT) tablet, Take 1,000 Units by mouth daily., Disp: , Rfl:    ferrous sulfate 325 (65 FE) MG EC tablet, Take 325 mg by mouth daily., Disp: , Rfl:    fluconazole (DIFLUCAN) 150 MG tablet, Take 1 tablet (150 mg total) by mouth daily. Take second dose 72 hours later if symptoms still persists., Disp: 2 tablet, Rfl: 0   ipratropium (ATROVENT) 0.03 % nasal spray, Place 2 sprays into both nostrils every 12 (twelve) hours., Disp: 30 mL, Rfl: 0   levETIRAcetam (KEPPRA) 1000 MG tablet, Take 1 tablet (1,000 mg total) by mouth 2 (two) times daily., Disp: 60 tablet, Rfl: 11   lidocaine (XYLOCAINE) 2 % solution, 5mL swallow every 4 hours as needed for sore throat, Disp: 100 mL, Rfl: 0  Observations/Objective: Patient is well-developed, well-nourished in no acute distress.  Resting comfortably at home.  Head is normocephalic, atraumatic.  No labored breathing.  Speech is clear and  coherent with logical content.  Patient is alert and oriented at baseline.    Assessment and Plan: 1. Acute midline low back pain without sciatica - predniSONE (DELTASONE) 20 MG tablet; Take 2 tablets (40 mg total) by mouth daily with breakfast.  Dispense: 10 tablet; Refill: 0  2. Other headache syndrome - predniSONE (DELTASONE) 20 MG tablet; Take 2 tablets (40 mg total) by mouth daily with breakfast.  Dispense: 10 tablet; Refill: 0  - Prednisone given for headache and back pain - Have suspicions patient was not truthful about being seen in ER today with a MRI, but cannot confirm or deny this - Advised patient seizures can be deadly and she should seek appropriate follow up with her Neurologist, even if a virtual visit, or establish locally in [place], if needed  Follow Up Instructions: I discussed the assessment and treatment plan with the patient. The patient was provided an opportunity to ask questions and all were answered. The patient agreed with the plan and demonstrated an understanding of the instructions.  A copy of instructions were sent to the patient via MyChart unless otherwise noted below.    The patient was advised to call back or seek an in-person evaluation if the symptoms worsen or if the condition fails to improve as anticipated.  Time:  I spent 12 minutes with the patient via telehealth technology discussing the above problems/concerns.    [name], PA-C

## 2021-10-26 ENCOUNTER — Telehealth: Payer: Medicaid Other | Admitting: Family

## 2021-10-26 DIAGNOSIS — J069 Acute upper respiratory infection, unspecified: Secondary | ICD-10-CM | POA: Diagnosis not present

## 2021-10-26 MED ORDER — CETIRIZINE HCL 10 MG PO TABS: 10.0000 mg | ORAL_TABLET | Freq: Every day | ORAL | 1 refills | Status: DC

## 2021-10-26 MED ORDER — FLUTICASONE PROPIONATE 50 MCG/ACT NA SUSP: 2.0000 | Freq: Every day | NASAL | 6 refills | Status: DC

## 2021-10-26 MED ORDER — BENZONATATE 100 MG PO CAPS: 100.0000 mg | ORAL_CAPSULE | Freq: Three times a day (TID) | ORAL | 0 refills | Status: DC | PRN

## 2021-10-26 NOTE — Progress Notes (Signed)
Virtual Visit Consent   Gabriella Montgomery, you are scheduled for a virtual visit with a [facility] provider today. Just as with appointments in the office, your consent must be obtained to participate. Your consent will be active for this visit and any virtual visit you may have with one of our providers in the next 365 days. If you have a MyChart account, a copy of this consent can be sent to you electronically.  As this is a virtual visit, video technology does not allow for your provider to perform a traditional examination. This may limit your provider's ability to fully assess your condition. If your provider identifies any concerns that need to be evaluated in person or the need to arrange testing (such as labs, EKG, etc.), we will make arrangements to do so. Although advances in technology are sophisticated, we cannot ensure that it will always work on either your end or our end. If the connection with a video visit is poor, the visit may have to be switched to a telephone visit. With either a video or telephone visit, we are not always able to ensure that we have a secure connection.  By engaging in this virtual visit, you consent to the provision of healthcare and authorize for your insurance to be billed (if applicable) for the services provided during this visit. Depending on your insurance coverage, you may receive a charge related to this service.  I need to obtain your verbal consent now. Are you willing to proceed with your visit today? Gabriella Montgomery has provided verbal consent on 10/26/2021 for a virtual visit (video or telephone). Jannifer Rodney, FNP  Date: 10/26/2021 3:13 PM  Virtual Visit via Video Note   I, Jannifer Rodney, connected with  Gabriella Montgomery  ([id number], Apr 18, 1986) on 10/26/21 at  3:15 PM EDT by a video-enabled telemedicine application and verified that I am speaking with the correct person using two identifiers.  Location: Patient: Virtual Visit Location Patient:  Home Provider: Virtual Visit Location Provider: Home Office   I discussed the limitations of evaluation and management by telemedicine and the availability of in person appointments. The patient expressed understanding and agreed to proceed.    History of Present Illness: Gabriella Montgomery is a 36 y.o. who identifies as a female who was assigned female at birth, and is being seen today for nasal congestion for the last week.  HPI: URI  This is a new problem. The current episode started in the past 7 days. The problem has been unchanged. There has been no fever. Associated symptoms include congestion, coughing, sinus pain, sneezing and a sore throat. Pertinent negatives include no dysuria or ear pain. She has tried acetaminophen for the symptoms. The treatment provided mild relief.    Problems:  Patient Active Problem List   Diagnosis Date Noted   Vitamin D deficiency 10/12/2019   Seizure disorder (HCC) 08/18/2019   Migraine without aura and without status migrainosus, not intractable 08/18/2019   Class 3 severe obesity due to excess calories without serious comorbidity with body mass index (BMI) of 45.0 to 49.9 in adult [facility]) 08/18/2019   Normochromic anemia 08/18/2019   Pneumonia due to COVID-19 virus 07/19/2019   Seizure-like activity (HCC) 07/14/2019   History of pulmonary embolism 02/17/2016   Pulmonary embolism affecting pregnancy 01/14/2016    Allergies:  Allergies  Allergen Reactions   Azithromycin Anaphylaxis   Amoxicillin Hives    Has patient had a PCN reaction causing immediate rash, facial/tongue/throat swelling,  SOB or lightheadedness with hypotension: No Has patient had a PCN reaction causing severe rash involving mucus membranes or skin necrosis: YEs Has patient had a PCN reaction that required hospitalization: No Has patient had a PCN reaction occurring within the last 10 years: No If all of the above answers are "NO", then may proceed with Cephalosporin use.    Clindamycin Hives   Ibuprofen Hives   Tramadol Hives   Medications:  Current Outpatient Medications:    benzonatate (TESSALON PERLES) 100 MG capsule, Take 1 capsule (100 mg total) by mouth 3 (three) times daily as needed., Disp: 20 capsule, Rfl: 0   cetirizine (ZYRTEC ALLERGY) 10 MG tablet, Take 1 tablet (10 mg total) by mouth daily., Disp: 90 tablet, Rfl: 1   fluticasone (FLONASE) 50 MCG/ACT nasal spray, Place 2 sprays into both nostrils daily., Disp: 16 g, Rfl: 6   cholecalciferol (VITAMIN D3) 25 MCG (1000 UNIT) tablet, Take 1,000 Units by mouth daily., Disp: , Rfl:    ferrous sulfate 325 (65 FE) MG EC tablet, Take 325 mg by mouth daily., Disp: , Rfl:    levETIRAcetam (KEPPRA) 1000 MG tablet, Take 1 tablet (1,000 mg total) by mouth 2 (two) times daily., Disp: 60 tablet, Rfl: 11   predniSONE (DELTASONE) 20 MG tablet, Take 2 tablets (40 mg total) by mouth daily with breakfast., Disp: 10 tablet, Rfl: 0  Observations/Objective: Patient is well-developed, well-nourished in no acute distress.  Resting comfortably  at home.  Head is normocephalic, atraumatic.  No labored breathing.  Speech is clear and coherent with logical content.  Patient is alert and oriented at baseline.  Nasal congestion  Assessment and Plan: 1. Viral URI - fluticasone (FLONASE) 50 MCG/ACT nasal spray; Place 2 sprays into both nostrils daily.  Dispense: 16 g; Refill: 6 - cetirizine (ZYRTEC ALLERGY) 10 MG tablet; Take 1 tablet (10 mg total) by mouth daily.  Dispense: 90 tablet; Refill: 1 - benzonatate (TESSALON PERLES) 100 MG capsule; Take 1 capsule (100 mg total) by mouth 3 (three) times daily as needed.  Dispense: 20 capsule; Refill: 0  - Take meds as prescribed - Use a cool mist humidifier  -Use saline nose sprays frequently -Force fluids -For any cough or congestion  Use plain Mucinex- regular strength or max strength is fine -For fever or aces or pains- take tylenol or ibuprofen. -Throat lozenges if  help -Follow up if symptoms worsen or do not improve   Follow Up Instructions: I discussed the assessment and treatment plan with the patient. The patient was provided an opportunity to ask questions and all were answered. The patient agreed with the plan and demonstrated an understanding of the instructions.  A copy of instructions were sent to the patient via MyChart unless otherwise noted below.     The patient was advised to call back or seek an in-person evaluation if the symptoms worsen or if the condition fails to improve as anticipated.  Time:  I spent 8 minutes with the patient via telehealth technology discussing the above problems/concerns.    Jannifer Rodney, FNP

## 2021-11-30 ENCOUNTER — Telehealth: Payer: Medicaid Other | Admitting: Nurse Practitioner

## 2021-11-30 DIAGNOSIS — M542 Cervicalgia: Secondary | ICD-10-CM | POA: Diagnosis not present

## 2021-11-30 DIAGNOSIS — M545 Low back pain, unspecified: Secondary | ICD-10-CM

## 2021-11-30 MED ORDER — BACLOFEN 10 MG PO TABS: 10.0000 mg | ORAL_TABLET | Freq: Three times a day (TID) | ORAL | 0 refills | Status: DC

## 2021-11-30 MED ORDER — GABAPENTIN 100 MG PO CAPS: 100.0000 mg | ORAL_CAPSULE | Freq: Three times a day (TID) | ORAL | 0 refills | Status: DC

## 2021-11-30 NOTE — Patient Instructions (Signed)
Gabriella Montgomery, thank you for joining [name], NP for today's virtual visit.  While this provider is not your primary care provider (PCP), if your PCP is located in our provider database this encounter information will be shared with them immediately following your visit.  Consent: (Patient) Gabriella Montgomery provided verbal consent for this virtual visit at the beginning of the encounter.  Current Medications:  Current Outpatient Medications:    baclofen (LIORESAL) 10 MG tablet, Take 1 tablet (10 mg total) by mouth 3 (three) times daily., Disp: 60 each, Rfl: 0   gabapentin (NEURONTIN) 100 MG capsule, Take 1 capsule (100 mg total) by mouth 3 (three) times daily., Disp: 60 capsule, Rfl: 0   benzonatate (TESSALON PERLES) 100 MG capsule, Take 1 capsule (100 mg total) by mouth 3 (three) times daily as needed., Disp: 20 capsule, Rfl: 0   cetirizine (ZYRTEC ALLERGY) 10 MG tablet, Take 1 tablet (10 mg total) by mouth daily., Disp: 90 tablet, Rfl: 1   cholecalciferol (VITAMIN D3) 25 MCG (1000 UNIT) tablet, Take 1,000 Units by mouth daily., Disp: , Rfl:    ferrous sulfate 325 (65 FE) MG EC tablet, Take 325 mg by mouth daily., Disp: , Rfl:    fluticasone (FLONASE) 50 MCG/ACT nasal spray, Place 2 sprays into both nostrils daily., Disp: 16 g, Rfl: 6   levETIRAcetam (KEPPRA) 1000 MG tablet, Take 1 tablet (1,000 mg total) by mouth 2 (two) times daily., Disp: 60 tablet, Rfl: 11   predniSONE (DELTASONE) 20 MG tablet, Take 2 tablets (40 mg total) by mouth daily with breakfast., Disp: 10 tablet, Rfl: 0   Medications ordered in this encounter:  Meds ordered this encounter  Medications   baclofen (LIORESAL) 10 MG tablet    Sig: Take 1 tablet (10 mg total) by mouth 3 (three) times daily.    Dispense:  60 each    Refill:  0    Order Specific Question:   Supervising Provider    Answer:   MILLER, BRIAN [[id number]]   gabapentin (NEURONTIN) 100 MG capsule    Sig: Take 1 capsule (100 mg total) by mouth 3  (three) times daily.    Dispense:  60 capsule    Refill:  0    Order Specific Question:   Supervising Provider    Answer:   Hyacinth Meeker, BRIAN [[id number]]     *If you need refills on other medications prior to your next appointment, please contact your pharmacy*  Follow-Up: Call back or seek an in-person evaluation if the symptoms worsen or if the condition fails to improve as anticipated.  Other Instructions Work on losing weight to help reduce back pain. May alternate with heat and ice application for pain relief. May also alternate with acetaminophen and Ibuprofen as prescribed for back pain. Other alternatives include massage, acupuncture and water aerobics.  You must stay active and avoid a sedentary lifestyle.     If you have been instructed to have an in-person evaluation today at a local Urgent Care facility, please use the link below. It will take you to a list of all of our available [facility], including address, phone number and hours of operation. Please do not delay care.  [facility]  If you or a family member do not have a primary care provider, use the link below to schedule a visit and establish care. When you choose a [facility] primary care physician or advanced practice provider, you gain a long-term partner  in health. Find a Primary Care Provider  Learn more about 's in-office and virtual care options: [facility]

## 2021-11-30 NOTE — Progress Notes (Signed)
Virtual Visit Consent   BISMA KLETT, you are scheduled for a virtual visit with a  provider today. Just as with appointments in the office, your consent must be obtained to participate. Your consent will be active for this visit and any virtual visit you may have with one of our providers in the next 365 days. If you have a MyChart account, a copy of this consent can be sent to you electronically.  As this is a virtual visit, video technology does not allow for your provider to perform a traditional examination. This may limit your provider's ability to fully assess your condition. If your provider identifies any concerns that need to be evaluated in person or the need to arrange testing (such as labs, EKG, etc.), we will make arrangements to do so. Although advances in technology are sophisticated, we cannot ensure that it will always work on either your end or our end. If the connection with a video visit is poor, the visit may have to be switched to a telephone visit. With either a video or telephone visit, we are not always able to ensure that we have a secure connection.  By engaging in this virtual visit, you consent to the provision of healthcare and authorize for your insurance to be billed (if applicable) for the services provided during this visit. Depending on your insurance coverage, you may receive a charge related to this service.  I need to obtain your verbal consent now. Are you willing to proceed with your visit today? Gabriella Montgomery has provided verbal consent on 11/30/2021 for a virtual visit (video or telephone). [name], NP  Date: 11/30/2021 5:49 PM  Virtual Visit via Video Note   I, [name], connected with  Gabriella Montgomery  ([id number], [birth year]) on 11/30/21 at  5:15 PM EDT by a video-enabled telemedicine application and verified that I am speaking with the correct person using two identifiers.  Location: Patient: Virtual Visit Location Patient:  Home Provider: Virtual Visit Location Provider: Home Office   I discussed the limitations of evaluation and management by telemedicine and the availability of in person appointments. The patient expressed understanding and agreed to proceed.    History of Present Illness: Gabriella Montgomery is a 36 y.o. who identifies as a female who was assigned female at birth, and is being seen today for chronic neck and back pain.  She has been experiencing cervical neck and low back pain for a few years on and off. Unrelated to injury or trauma. She usually takes OTC analgesics for pain with significant relief. Today she states pain has been unrelieved with current therapies. Pain is radiating from the right side of her neck down her shoulder and into lower back. 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 BMI >43.  Problems:  Patient Active Problem List   Diagnosis Date Noted   Vitamin D deficiency 10/12/2019   Seizure disorder (HCC) 08/18/2019   Migraine without aura and without status migrainosus, not intractable 08/18/2019   Class 3 severe obesity due to excess calories without serious comorbidity with body mass index (BMI) of 45.0 to 49.9 in adult [facility]) 08/18/2019   Normochromic anemia 08/18/2019   Pneumonia due to COVID-19 virus 07/19/2019   Seizure-like activity (HCC) 07/14/2019   History of pulmonary embolism 02/17/2016   Pulmonary embolism affecting pregnancy 01/14/2016    Allergies:  Allergies  Allergen Reactions   Azithromycin Anaphylaxis  Amoxicillin Hives    Has patient had a PCN reaction causing immediate rash, facial/tongue/throat swelling, SOB or lightheadedness with hypotension: No Has patient had a PCN reaction causing severe rash involving mucus membranes or skin necrosis: YEs Has patient had a PCN reaction that required hospitalization: No Has patient had a PCN reaction occurring within the last 10 years: No If all of the above  answers are "NO", then may proceed with Cephalosporin use.   Clindamycin Hives   Ibuprofen Hives   Tramadol Hives   Medications:  Current Outpatient Medications:    baclofen (LIORESAL) 10 MG tablet, Take 1 tablet (10 mg total) by mouth 3 (three) times daily., Disp: 60 each, Rfl: 0   gabapentin (NEURONTIN) 100 MG capsule, Take 1 capsule (100 mg total) by mouth 3 (three) times daily., Disp: 60 capsule, Rfl: 0   benzonatate (TESSALON PERLES) 100 MG capsule, Take 1 capsule (100 mg total) by mouth 3 (three) times daily as needed., Disp: 20 capsule, Rfl: 0   cetirizine (ZYRTEC ALLERGY) 10 MG tablet, Take 1 tablet (10 mg total) by mouth daily., Disp: 90 tablet, Rfl: 1   cholecalciferol (VITAMIN D3) 25 MCG (1000 UNIT) tablet, Take 1,000 Units by mouth daily., Disp: , Rfl:    ferrous sulfate 325 (65 FE) MG EC tablet, Take 325 mg by mouth daily., Disp: , Rfl:    fluticasone (FLONASE) 50 MCG/ACT nasal spray, Place 2 sprays into both nostrils daily., Disp: 16 g, Rfl: 6   levETIRAcetam (KEPPRA) 1000 MG tablet, Take 1 tablet (1,000 mg total) by mouth 2 (two) times daily., Disp: 60 tablet, Rfl: 11   predniSONE (DELTASONE) 20 MG tablet, Take 2 tablets (40 mg total) by mouth daily with breakfast., Disp: 10 tablet, Rfl: 0  Observations/Objective: Patient is well-developed, well-nourished in no acute distress.  Resting comfortably at home.  Head is normocephalic, atraumatic.  No labored breathing.  Speech is clear and coherent with logical content.  Patient is alert and oriented at baseline.    Assessment and Plan: 1. Acute midline low back pain without sciatica - baclofen (LIORESAL) 10 MG tablet; Take 1 tablet (10 mg total) by mouth 3 (three) times daily.  Dispense: 60 each; Refill: 0 - gabapentin (NEURONTIN) 100 MG capsule; Take 1 capsule (100 mg total) by mouth 3 (three) times daily.  Dispense: 60 capsule; Refill: 0  2. Neck pain - baclofen (LIORESAL) 10 MG tablet; Take 1 tablet (10 mg total) by  mouth 3 (three) times daily.  Dispense: 60 each; Refill: 0 - gabapentin (NEURONTIN) 100 MG capsule; Take 1 capsule (100 mg total) by mouth 3 (three) times daily.  Dispense: 60 capsule; Refill: 0  Work on losing weight to help reduce back pain. May alternate with heat and ice application for pain relief. May also alternate with acetaminophen and Ibuprofen as prescribed for back pain. Other alternatives include massage, acupuncture and water aerobics.  You must stay active and avoid a sedentary lifestyle.    Follow Up Instructions: I discussed the assessment and treatment plan with the patient. The patient was provided an opportunity to ask questions and all were answered. The patient agreed with the plan and demonstrated an understanding of the instructions.  A copy of instructions were sent to the patient via MyChart unless otherwise noted below.    The patient was advised to call back or seek an in-person evaluation if the symptoms worsen or if the condition fails to improve as anticipated.  Time:  I spent 11 minutes with  the patient via telehealth technology discussing the above problems/concerns.    Gildardo Pounds, NP

## 2021-12-01 ENCOUNTER — Telehealth: Payer: Medicaid Other | Admitting: Emergency Medicine

## 2021-12-01 DIAGNOSIS — T50905A Adverse effect of unspecified drugs, medicaments and biological substances, initial encounter: Secondary | ICD-10-CM | POA: Diagnosis not present

## 2021-12-01 DIAGNOSIS — L299 Pruritus, unspecified: Secondary | ICD-10-CM

## 2021-12-01 MED ORDER — CYCLOBENZAPRINE HCL 10 MG PO TABS: 10.0000 mg | ORAL_TABLET | Freq: Three times a day (TID) | ORAL | 0 refills | Status: DC | PRN

## 2021-12-01 NOTE — Progress Notes (Signed)
Virtual Visit Consent   Gabriella Montgomery, you are scheduled for a virtual visit with a [facility] provider today. Just as with appointments in the office, your consent must be obtained to participate. Your consent will be active for this visit and any virtual visit you may have with one of our providers in the next 365 days. If you have a MyChart account, a copy of this consent can be sent to you electronically.  As this is a virtual visit, video technology does not allow for your provider to perform a traditional examination. This may limit your provider's ability to fully assess your condition. If your provider identifies any concerns that need to be evaluated in person or the need to arrange testing (such as labs, EKG, etc.), we will make arrangements to do so. Although advances in technology are sophisticated, we cannot ensure that it will always work on either your end or our end. If the connection with a video visit is poor, the visit may have to be switched to a telephone visit. With either a video or telephone visit, we are not always able to ensure that we have a secure connection.  By engaging in this virtual visit, you consent to the provision of healthcare and authorize for your insurance to be billed (if applicable) for the services provided during this visit. Depending on your insurance coverage, you may receive a charge related to this service.  I need to obtain your verbal consent now. Are you willing to proceed with your visit today? Gabriella Montgomery has provided verbal consent on 12/01/2021 for a virtual visit (video or telephone). Roxy Horseman, PA-C  Date: 12/01/2021 12:41 PM  Virtual Visit via Video Note   I, Roxy Horseman, connected with  Gabriella Montgomery  ([id number], 27-Mar-1986) on 12/01/21 at 12:30 PM EDT by a video-enabled telemedicine application and verified that I am speaking with the correct person using two identifiers.  Location: Patient: Virtual Visit Location  Patient: Home Provider: Virtual Visit Location Provider: Mobile   I discussed the limitations of evaluation and management by telemedicine and the availability of in person appointments. The patient expressed understanding and agreed to proceed.    History of Present Illness: Gabriella Montgomery is a 36 y.o. who identifies as a female who was assigned female at birth, and is being seen today for medication reaction.  She states she was just started on Baclofen and Gabapentin for sciatica.  She states that she has been itching since starting these medications.  Denies any other complaints.  HPI: HPI  Problems:  Patient Active Problem List   Diagnosis Date Noted   Vitamin D deficiency 10/12/2019   Seizure disorder (HCC) 08/18/2019   Migraine without aura and without status migrainosus, not intractable 08/18/2019   Class 3 severe obesity due to excess calories without serious comorbidity with body mass index (BMI) of 45.0 to 49.9 in adult [facility]) 08/18/2019   Normochromic anemia 08/18/2019   Pneumonia due to COVID-19 virus 07/19/2019   Seizure-like activity (HCC) 07/14/2019   History of pulmonary embolism 02/17/2016   Pulmonary embolism affecting pregnancy 01/14/2016    Allergies:  Allergies  Allergen Reactions   Azithromycin Anaphylaxis   Amoxicillin Hives    Has patient had a PCN reaction causing immediate rash, facial/tongue/throat swelling, SOB or lightheadedness with hypotension: No Has patient had a PCN reaction causing severe rash involving mucus membranes or skin necrosis: YEs Has patient had a PCN reaction that required hospitalization: No Has patient had  a PCN reaction occurring within the last 10 years: No If all of the above answers are "NO", then may proceed with Cephalosporin use.   Clindamycin Hives   Ibuprofen Hives   Tramadol Hives   Medications:  Current Outpatient Medications:    cyclobenzaprine (FLEXERIL) 10 MG tablet, Take 1 tablet (10 mg total) by mouth 3 (three)  times daily as needed for muscle spasms., Disp: 10 tablet, Rfl: 0   benzonatate (TESSALON PERLES) 100 MG capsule, Take 1 capsule (100 mg total) by mouth 3 (three) times daily as needed., Disp: 20 capsule, Rfl: 0   cetirizine (ZYRTEC ALLERGY) 10 MG tablet, Take 1 tablet (10 mg total) by mouth daily., Disp: 90 tablet, Rfl: 1   cholecalciferol (VITAMIN D3) 25 MCG (1000 UNIT) tablet, Take 1,000 Units by mouth daily., Disp: , Rfl:    ferrous sulfate 325 (65 FE) MG EC tablet, Take 325 mg by mouth daily., Disp: , Rfl:    fluticasone (FLONASE) 50 MCG/ACT nasal spray, Place 2 sprays into both nostrils daily., Disp: 16 g, Rfl: 6   levETIRAcetam (KEPPRA) 1000 MG tablet, Take 1 tablet (1,000 mg total) by mouth 2 (two) times daily., Disp: 60 tablet, Rfl: 11   predniSONE (DELTASONE) 20 MG tablet, Take 2 tablets (40 mg total) by mouth daily with breakfast., Disp: 10 tablet, Rfl: 0  Observations/Objective: Patient is well-developed, well-nourished in no acute distress.  Outside walking.  Head is normocephalic, atraumatic.  No labored breathing. No distress. Speech is clear and coherent with logical content. Normal voice Patient is alert and oriented at baseline.  No visibile rash on camera  Assessment and Plan: 1. Adverse effect of drug, initial encounter  Discontinue Baclofen and Gabapentin.  Start flexeril.  PCP follow-up.  Follow Up Instructions: I discussed the assessment and treatment plan with the patient. The patient was provided an opportunity to ask questions and all were answered. The patient agreed with the plan and demonstrated an understanding of the instructions.  A copy of instructions were sent to the patient via MyChart unless otherwise noted below.     The patient was advised to call back or seek an in-person evaluation if the symptoms worsen or if the condition fails to improve as anticipated.  Time:  I spent 12 minutes with the patient via telehealth technology discussing the above  problems/concerns.    Roxy Horseman, PA-C

## 2021-12-03 ENCOUNTER — Encounter (INDEPENDENT_AMBULATORY_CARE_PROVIDER_SITE_OTHER): Payer: Self-pay

## 2022-01-05 ENCOUNTER — Telehealth: Payer: Medicaid Other | Admitting: Physician Assistant

## 2022-01-05 DIAGNOSIS — B9689 Other specified bacterial agents as the cause of diseases classified elsewhere: Secondary | ICD-10-CM | POA: Diagnosis not present

## 2022-01-05 DIAGNOSIS — J069 Acute upper respiratory infection, unspecified: Secondary | ICD-10-CM | POA: Diagnosis not present

## 2022-01-05 MED ORDER — DOXYCYCLINE HYCLATE 100 MG PO TABS: 100.0000 mg | ORAL_TABLET | Freq: Two times a day (BID) | ORAL | 0 refills | Status: DC

## 2022-01-05 NOTE — Patient Instructions (Signed)
Gabriella Montgomery, thank you for joining [name], PA-C for today's virtual visit.  While this provider is not your primary care provider (PCP), if your PCP is located in our provider database this encounter information will be shared with them immediately following your visit.  Consent: (Patient) Gabriella Montgomery provided verbal consent for this virtual visit at the beginning of the encounter.  Current Medications:  Current Outpatient Medications:    doxycycline (VIBRA-TABS) 100 MG tablet, Take 1 tablet (100 mg total) by mouth 2 (two) times daily., Disp: 20 tablet, Rfl: 0   benzonatate (TESSALON PERLES) 100 MG capsule, Take 1 capsule (100 mg total) by mouth 3 (three) times daily as needed., Disp: 20 capsule, Rfl: 0   cetirizine (ZYRTEC ALLERGY) 10 MG tablet, Take 1 tablet (10 mg total) by mouth daily., Disp: 90 tablet, Rfl: 1   cholecalciferol (VITAMIN D3) 25 MCG (1000 UNIT) tablet, Take 1,000 Units by mouth daily., Disp: , Rfl:    cyclobenzaprine (FLEXERIL) 10 MG tablet, Take 1 tablet (10 mg total) by mouth 3 (three) times daily as needed for muscle spasms., Disp: 10 tablet, Rfl: 0   ferrous sulfate 325 (65 FE) MG EC tablet, Take 325 mg by mouth daily., Disp: , Rfl:    fluticasone (FLONASE) 50 MCG/ACT nasal spray, Place 2 sprays into both nostrils daily., Disp: 16 g, Rfl: 6   levETIRAcetam (KEPPRA) 1000 MG tablet, Take 1 tablet (1,000 mg total) by mouth 2 (two) times daily., Disp: 60 tablet, Rfl: 11   predniSONE (DELTASONE) 20 MG tablet, Take 2 tablets (40 mg total) by mouth daily with breakfast., Disp: 10 tablet, Rfl: 0   Medications ordered in this encounter:  Meds ordered this encounter  Medications   doxycycline (VIBRA-TABS) 100 MG tablet    Sig: Take 1 tablet (100 mg total) by mouth 2 (two) times daily.    Dispense:  20 tablet    Refill:  0    Order Specific Question:   Supervising Provider    Answer:   Merrilee Jansky [id number]     *If you need refills on other  medications prior to your next appointment, please contact your pharmacy*  Follow-Up: Call back or seek an in-person evaluation if the symptoms worsen or if the condition fails to improve as anticipated.  Other Instructions Upper Respiratory Infection, Adult An upper respiratory infection (URI) affects the nose, throat, and upper airways that lead to the lungs. The most common type of URI is often called the common cold. URIs usually get better on their own, without medical treatment. What are the causes? A URI is caused by a germ (virus). You may catch these germs by: Breathing in droplets from an infected person's cough or sneeze. Touching something that has the germ on it (is contaminated) and then touching your mouth, nose, or eyes. What increases the risk? You are more likely to get a URI if: You are very young or very old. You have close contact with others, such as at work, school, or a health care facility. You smoke. You have long-term (chronic) heart or lung disease. You have a weakened disease-fighting system (immune system). You have nasal allergies or asthma. You have a lot of stress. You have poor nutrition. What are the signs or symptoms? Runny or stuffy (congested) nose. Cough. Sneezing. Sore throat. Headache. Feeling tired (fatigue). Fever. Not wanting to eat as much as usual. Pain in your forehead, behind your eyes, and over your cheekbones (sinus pain). Muscle  aches. Redness or irritation of the eyes. Pressure in the ears or face. How is this treated? URIs usually get better on their own within 7-10 days. Medicines cannot cure URIs, but your doctor may recommend certain medicines to help relieve symptoms, such as: Over-the-counter cold medicines. Medicines to reduce coughing (cough suppressants). Coughing is a type of defense against infection that helps to clear the nose, throat, windpipe, and lungs (respiratory system). Take these medicines only as told by  your doctor. Medicines to lower your fever. Follow these instructions at home: Activity Rest as needed. If you have a fever, stay home from work or school until your fever is gone, or until your doctor says you may return to work or school. You should stay home until you cannot spread the infection anymore (you are not contagious). Your doctor may have you wear a face mask so you have less risk of spreading the infection. Relieving symptoms Rinse your mouth often with salt water. To make salt water, dissolve -1 tsp (3-6 g) of salt in 1 cup (237 mL) of warm water. Use a cool-mist humidifier to add moisture to the air. This can help you breathe more easily. Eating and drinking  Drink enough fluid to keep your pee (urine) pale yellow. Eat soups and other clear broths. General instructions  Take over-the-counter and prescription medicines only as told by your doctor. Do not smoke or use any products that contain nicotine or tobacco. If you need help quitting, ask your doctor. Avoid being where people are smoking (avoid secondhand smoke). Stay up to date on all your shots (immunizations), and get the flu shot every year. Keep all follow-up visits. How to prevent the spread of infection to others  Wash your hands with soap and water for at least 20 seconds. If you cannot use soap and water, use hand sanitizer. Avoid touching your mouth, face, eyes, or nose. Cough or sneeze into a tissue or your sleeve or elbow. Do not cough or sneeze into your hand or into the air. Contact a doctor if: You are getting worse, not better. You have any of these: A fever or chills. Brown or red mucus in your nose. Yellow or brown fluid (discharge)coming from your nose. Pain in your face, especially when you bend forward. Swollen neck glands. Pain when you swallow. White areas in the back of your throat. Get help right away if: You have shortness of breath that gets worse. You have very bad or  constant: Headache. Ear pain. Pain in your forehead, behind your eyes, and over your cheekbones (sinus pain). Chest pain. You have long-lasting (chronic) lung disease along with any of these: Making high-pitched whistling sounds when you breathe, most often when you breathe out (wheezing). Long-lasting cough (more than 14 days). Coughing up blood. A change in your usual mucus. You have a stiff neck. You have changes in your: Vision. Hearing. Thinking. Mood. These symptoms may be an emergency. Get help right away. Call 911. Do not wait to see if the symptoms will go away. Do not drive yourself to the hospital. Summary An upper respiratory infection (URI) is caused by a germ (virus). The most common type of URI is often called the common cold. URIs usually get better within 7-10 days. Take over-the-counter and prescription medicines only as told by your doctor. This information is not intended to replace advice given to you by your health care provider. Make sure you discuss any questions you have with your health care provider.  Document Revised: 11/13/2020 Document Reviewed: 11/13/2020 Elsevier Patient Education  2023 [employer]vier Inc[employer]    If you have been instructed to have an in-person evaluation today at a local Urgent Care facility, please use the link below. It will take you to a list of all of our available [facility], including address, phone number and hours of operation. Please do not delay care.  [facility]  If you or a family member do not have a primary care provider, use the link below to schedule a visit and establish care. When you choose a [facility] primary care physician or advanced practice provider, you gain a long-term partner in health. Find a Primary Care Provider  Learn more about [facility]'s in-office and virtual care options: [facility] Get [facility] Now

## 2022-01-05 NOTE — Progress Notes (Signed)
Virtual Visit Consent   Gabriella Montgomery, you are scheduled for a virtual visit with a [facility] provider today. Just as with appointments in the office, your consent must be obtained to participate. Your consent will be active for this visit and any virtual visit you may have with one of our providers in the next 365 days. If you have a MyChart account, a copy of this consent can be sent to you electronically.  As this is a virtual visit, video technology does not allow for your provider to perform a traditional examination. This may limit your provider's ability to fully assess your condition. If your provider identifies any concerns that need to be evaluated in person or the need to arrange testing (such as labs, EKG, etc.), we will make arrangements to do so. Although advances in technology are sophisticated, we cannot ensure that it will always work on either your end or our end. If the connection with a video visit is poor, the visit may have to be switched to a telephone visit. With either a video or telephone visit, we are not always able to ensure that we have a secure connection.  By engaging in this virtual visit, you consent to the provision of healthcare and authorize for your insurance to be billed (if applicable) for the services provided during this visit. Depending on your insurance coverage, you may receive a charge related to this service.  I need to obtain your verbal consent now. Are you willing to proceed with your visit today? Gabriella Montgomery has provided verbal consent on 01/05/2022 for a virtual visit (video or telephone). [name], PA-C  Date: 01/05/2022 4:39 PM  Virtual Visit via Video Note   I, [name], connected with  Gabriella Montgomery  ([id number], 07/23/1985) on 01/05/22 at  4:30 PM EDT by a video-enabled telemedicine application and verified that I am speaking with the correct person using two identifiers.  Location: Patient: Virtual Visit  Location Patient: Mobile Provider: Virtual Visit Location Provider: Home Office   I discussed the limitations of evaluation and management by telemedicine and the availability of in person appointments. The patient expressed understanding and agreed to proceed.    History of Present Illness: Gabriella Montgomery is a 36 y.o. who identifies as a female who was assigned female at birth, and is being seen today for URI symptoms.  HPI: URI  This is a new problem. The current episode started yesterday. The problem has been gradually worsening. The maximum temperature recorded prior to her arrival was 102 - 102.9 F. The fever has been present for Less than 1 day. Associated symptoms include congestion, coughing (productive), headaches, a plugged ear sensation, rhinorrhea (and post nasal drainage) and a sore throat. Pertinent negatives include no diarrhea, ear pain, nausea, sinus pain or vomiting. She has tried acetaminophen for the symptoms. The treatment provided mild relief.      Problems:  Patient Active Problem List   Diagnosis Date Noted   Vitamin D deficiency 10/12/2019   Seizure disorder (HCC) 08/18/2019   Migraine without aura and without status migrainosus, not intractable 08/18/2019   Class 3 severe obesity due to excess calories without serious comorbidity with body mass index (BMI) of 45.0 to 49.9 in adult [facility]) 08/18/2019   Normochromic anemia 08/18/2019   Pneumonia due to COVID-19 virus 07/19/2019   Seizure-like activity (HCC) 07/14/2019   History of pulmonary embolism 02/17/2016   Pulmonary embolism affecting pregnancy 01/14/2016    Allergies:  Allergies  Allergen Reactions   Azithromycin Anaphylaxis   Amoxicillin Hives    Has patient had a PCN reaction causing immediate rash, facial/tongue/throat swelling, SOB or lightheadedness with hypotension: No Has patient had a PCN reaction causing severe rash involving mucus membranes or skin necrosis: YEs Has patient had a PCN reaction  that required hospitalization: No Has patient had a PCN reaction occurring within the last 10 years: No If all of the above answers are "NO", then may proceed with Cephalosporin use.   Clindamycin Hives   Ibuprofen Hives   Tramadol Hives   Medications:  Current Outpatient Medications:    doxycycline (VIBRA-TABS) 100 MG tablet, Take 1 tablet (100 mg total) by mouth 2 (two) times daily., Disp: 20 tablet, Rfl: 0   benzonatate (TESSALON PERLES) 100 MG capsule, Take 1 capsule (100 mg total) by mouth 3 (three) times daily as needed., Disp: 20 capsule, Rfl: 0   cetirizine (ZYRTEC ALLERGY) 10 MG tablet, Take 1 tablet (10 mg total) by mouth daily., Disp: 90 tablet, Rfl: 1   cholecalciferol (VITAMIN D3) 25 MCG (1000 UNIT) tablet, Take 1,000 Units by mouth daily., Disp: , Rfl:    cyclobenzaprine (FLEXERIL) 10 MG tablet, Take 1 tablet (10 mg total) by mouth 3 (three) times daily as needed for muscle spasms., Disp: 10 tablet, Rfl: 0   ferrous sulfate 325 (65 FE) MG EC tablet, Take 325 mg by mouth daily., Disp: , Rfl:    fluticasone (FLONASE) 50 MCG/ACT nasal spray, Place 2 sprays into both nostrils daily., Disp: 16 g, Rfl: 6   levETIRAcetam (KEPPRA) 1000 MG tablet, Take 1 tablet (1,000 mg total) by mouth 2 (two) times daily., Disp: 60 tablet, Rfl: 11   predniSONE (DELTASONE) 20 MG tablet, Take 2 tablets (40 mg total) by mouth daily with breakfast., Disp: 10 tablet, Rfl: 0  Observations/Objective: Patient is well-developed, well-nourished in no acute distress.  Resting comfortably at home.  Head is normocephalic, atraumatic.  No labored breathing.  Speech is clear and coherent with logical content.  Patient is alert and oriented at baseline.    Assessment and Plan: 1. Bacterial upper respiratory infection - doxycycline (VIBRA-TABS) 100 MG tablet; Take 1 tablet (100 mg total) by mouth 2 (two) times daily.  Dispense: 20 tablet; Refill: 0  - Worsening symptoms that have not responded to OTC  medications.  - Will give Doxycycline - Continue allergy medications.  - Steam and humidifier can help - Stay well hydrated and get plenty of rest.  - Seek in person evaluation if no symptom improvement or if symptoms worsen   Follow Up Instructions: I discussed the assessment and treatment plan with the patient. The patient was provided an opportunity to ask questions and all were answered. The patient agreed with the plan and demonstrated an understanding of the instructions.  A copy of instructions were sent to the patient via MyChart unless otherwise noted below.    The patient was advised to call back or seek an in-person evaluation if the symptoms worsen or if the condition fails to improve as anticipated.  Time:  I spent 13 minutes with the patient via telehealth technology discussing the above problems/concerns.    [name], PA-C

## 2022-01-13 ENCOUNTER — Telehealth: Payer: Medicaid Other | Admitting: Physician Assistant

## 2022-01-14 ENCOUNTER — Telehealth: Payer: Medicaid Other | Admitting: Physician Assistant

## 2022-01-14 DIAGNOSIS — K047 Periapical abscess without sinus: Secondary | ICD-10-CM | POA: Diagnosis not present

## 2022-01-14 MED ORDER — MOXIFLOXACIN HCL 400 MG PO TABS: 400.0000 mg | ORAL_TABLET | Freq: Every day | ORAL | 0 refills | Status: AC

## 2022-01-14 MED ORDER — NAPROXEN 500 MG PO TABS: 500.0000 mg | ORAL_TABLET | Freq: Two times a day (BID) | ORAL | 0 refills | Status: DC

## 2022-01-14 NOTE — Patient Instructions (Signed)
Gabriella Montgomery, thank you for joining Mar Daring, PA-C for today's virtual visit.  While this provider is not your primary care provider (PCP), if your PCP is located in our provider database this encounter information will be shared with them immediately following your visit.  Consent: (Patient) Gabriella Montgomery provided verbal consent for this virtual visit at the beginning of the encounter.  Current Medications:  Current Outpatient Medications:    moxifloxacin (AVELOX) 400 MG tablet, Take 1 tablet (400 mg total) by mouth daily for 7 days., Disp: 7 tablet, Rfl: 0   naproxen (NAPROSYN) 500 MG tablet, Take 1 tablet (500 mg total) by mouth 2 (two) times daily with a meal., Disp: 30 tablet, Rfl: 0   benzonatate (TESSALON PERLES) 100 MG capsule, Take 1 capsule (100 mg total) by mouth 3 (three) times daily as needed., Disp: 20 capsule, Rfl: 0   cetirizine (ZYRTEC ALLERGY) 10 MG tablet, Take 1 tablet (10 mg total) by mouth daily., Disp: 90 tablet, Rfl: 1   cholecalciferol (VITAMIN D3) 25 MCG (1000 UNIT) tablet, Take 1,000 Units by mouth daily., Disp: , Rfl:    cyclobenzaprine (FLEXERIL) 10 MG tablet, Take 1 tablet (10 mg total) by mouth 3 (three) times daily as needed for muscle spasms., Disp: 10 tablet, Rfl: 0   doxycycline (VIBRA-TABS) 100 MG tablet, Take 1 tablet (100 mg total) by mouth 2 (two) times daily., Disp: 20 tablet, Rfl: 0   ferrous sulfate 325 (65 FE) MG EC tablet, Take 325 mg by mouth daily., Disp: , Rfl:    fluticasone (FLONASE) 50 MCG/ACT nasal spray, Place 2 sprays into both nostrils daily., Disp: 16 g, Rfl: 6   levETIRAcetam (KEPPRA) 1000 MG tablet, Take 1 tablet (1,000 mg total) by mouth 2 (two) times daily., Disp: 60 tablet, Rfl: 11   predniSONE (DELTASONE) 20 MG tablet, Take 2 tablets (40 mg total) by mouth daily with breakfast., Disp: 10 tablet, Rfl: 0   Medications ordered in this encounter:  Meds ordered this encounter  Medications   moxifloxacin (AVELOX) 400 MG  tablet    Sig: Take 1 tablet (400 mg total) by mouth daily for 7 days.    Dispense:  7 tablet    Refill:  0    Order Specific Question:   Supervising Provider    Answer:   Chase Picket [id number]   naproxen (NAPROSYN) 500 MG tablet    Sig: Take 1 tablet (500 mg total) by mouth 2 (two) times daily with a meal.    Dispense:  30 tablet    Refill:  0    Order Specific Question:   Supervising Provider    Answer:   Chase Picket [id number]     *If you need refills on other medications prior to your next appointment, please contact your pharmacy*  Follow-Up: Call back or seek an in-person evaluation if the symptoms worsen or if the condition fails to improve as anticipated.  Other Instructions Dental Abscess  A dental abscess is an area of pus in or around a tooth. It comes from an infection. It can cause pain and other symptoms. Treatment will help with symptoms and prevent the infection from spreading. What are the causes? This condition is caused by an infection in or around the tooth. This can be from: Very bad tooth decay (cavities). A bad injury to the tooth, such as a broken or chipped tooth. What increases the risk? The risk to get an abscess is higher in males.  It is also more likely in people who: Have dental decay. Have very bad gum disease. Eat sugary snacks between meals. Use tobacco. Have diabetes. Have a weak disease-fighting system (immune system). Do not brush their teeth regularly. What are the signs or symptoms? Some mild symptoms are: Tenderness. Bad breath. Fever. A sharp, sour taste in the mouth. Pain in and around the infected tooth. Worse symptoms of this condition include: Swollen neck glands. Chills. Pus draining around the tooth. Swelling and redness around the tooth, the mouth, or the face. Very bad pain in and around the tooth. The worst symptoms can include: Difficulty swallowing. Difficulty opening your mouth. Feeling like you may  vomit or vomiting. How is this treated? This is treated by getting rid of the infection. Your dentist will discuss ways to do this, including: Antibiotic medicines. Antibacterial mouth rinse. An incision in the abscess to drain out the pus. A root canal. Removing the tooth. Follow these instructions at home: Medicines Take over-the-counter and prescription medicines only as told by your dentist. If you were prescribed an antibiotic medicine, take it as told by your dentist. Do not stop taking it even if you start to feel better. If you were prescribed a gel that has numbing medicine in it, use it exactly as told. Ask your dentist if you should avoid driving or using machines while you are taking your medicine. General instructions Rinse your mouth often with salt water. To make salt water, dissolve -1 tsp (3-6 g) of salt in 1 cup (237 mL) of warm water. Eat a soft diet while your mouth is healing. Drink enough fluid to keep your pee (urine) pale yellow. Do not apply heat to the outside of your mouth. Do not smoke or use any products that contain nicotine or tobacco. If you need help quitting, ask your dentist. Keep all follow-up visits. Prevent an abscess Brush your teeth every morning and every night. Use fluoride toothpaste. Floss your teeth each day. Get dental cleanings as often as told by your dentist. Think about getting dental sealant put on teeth that have deep holes (decay). Drink water that has fluoride in it. Most tap water has fluoride. Check the label on bottled water to see if it has fluoride in it. Drink water instead of sugary drinks. Eat healthy meals and snacks. Wear a mouth guard or face shield when you play sports. Contact a doctor if: Your pain is worse and medicine does not help. Get help right away if: You have a fever or chills. Your symptoms suddenly get worse. You have a very bad headache. You have problems breathing or swallowing. You have trouble  opening your mouth. You have swelling in your neck or close to your eye. These symptoms may be an emergency. Get help right away. Call your local emergency services (911 in the [place] Do not wait to see if the symptoms will go away. Do not drive yourself to the hospital. Summary A dental abscess is an area of pus in or around a tooth. It is caused by an infection. Treatment will help with symptoms and prevent the infection from spreading. Take over-the-counter and prescription medicines only as told by your dentist. To prevent an abscess, take good care of your teeth. Brush your teeth every morning and night. Use floss every day. Get dental cleanings as often as told by your dentist. This information is not intended to replace advice given to you by your health care provider. Make sure you discuss any  questions you have with your health care provider. Document Revised: 06/20/2020 Document Reviewed: 06/20/2020 Elsevier Patient Education  [employer].    If you have been instructed to have an in-person evaluation today at a local Urgent Care facility, please use the link below. It will take you to a list of all of our available [facility], including address, phone number and hours of operation. Please do not delay care.  [facility]  If you or a family member do not have a primary care provider, use the link below to schedule a visit and establish care. When you choose a [facility] primary care physician or advanced practice provider, you gain a long-term partner in health. Find a Primary Care Provider  Learn more about [facility]'s in-office and virtual care options: [facility]

## 2022-01-14 NOTE — Progress Notes (Signed)
Virtual Visit Consent   Gabriella Montgomery, you are scheduled for a virtual visit with a [facility] provider today. Just as with appointments in the office, your consent must be obtained to participate. Your consent will be active for this visit and any virtual visit you may have with one of our providers in the next 365 days. If you have a MyChart account, a copy of this consent can be sent to you electronically.  As this is a virtual visit, video technology does not allow for your provider to perform a traditional examination. This may limit your provider's ability to fully assess your condition. If your provider identifies any concerns that need to be evaluated in person or the need to arrange testing (such as labs, EKG, etc.), we will make arrangements to do so. Although advances in technology are sophisticated, we cannot ensure that it will always work on either your end or our end. If the connection with a video visit is poor, the visit may have to be switched to a telephone visit. With either a video or telephone visit, we are not always able to ensure that we have a secure connection.  By engaging in this virtual visit, you consent to the provision of healthcare and authorize for your insurance to be billed (if applicable) for the services provided during this visit. Depending on your insurance coverage, you may receive a charge related to this service.  I need to obtain your verbal consent now. Are you willing to proceed with your visit today? Gabriella Montgomery has provided verbal consent on 01/14/2022 for a virtual visit (video or telephone). Mar Daring, PA-C  Date: 01/14/2022 9:45 AM  Virtual Visit via Video Note   I, Mar Daring, connected with  Gabriella Montgomery  ([id number], 1985-12-10) on 01/14/22 at  9:30 AM EDT by a video-enabled telemedicine application and verified that I am speaking with the correct person using two identifiers.  Location: Patient: Virtual Visit  Location Patient: Home Provider: Virtual Visit Location Provider: Home Office   I discussed the limitations of evaluation and management by telemedicine and the availability of in person appointments. The patient expressed understanding and agreed to proceed.    History of Present Illness: Gabriella Montgomery is a 36 y.o. who identifies as a female who was assigned female at birth, and is being seen today for possible dental infection. Was seen in ER in [facility] for dental pain and gum inflammation. Was told just to follow up with PCP. However, since she is temporarily in [place] she is unable to see her PCP. She has scheduled an appt with a dentist in [place] on Friday, 01/16/22. She is currently having left tooth pain with swelling and pain in the left side of the face and neck. She is currently on her next to last day of a 10 day supply of Doxycycline for bacterial bronchitis. She is allergic to PCN, Azithromycin, Clindamycin, and Ibuprofen all causing hives, with exception of azithromycin causing an anaphylactic reaction.    Problems:  Patient Active Problem List   Diagnosis Date Noted   Vitamin D deficiency 10/12/2019   Seizure disorder ([facility]) 08/18/2019   Migraine without aura and without status migrainosus, not intractable 08/18/2019   Class 3 severe obesity due to excess calories without serious comorbidity with body mass index (BMI) of 45.0 to 49.9 in adult [facility]) 08/18/2019   Normochromic anemia 08/18/2019   Pneumonia due to COVID-19 virus 07/19/2019   Seizure-like activity ([facility]) 07/14/2019  History of pulmonary embolism 02/17/2016   Pulmonary embolism affecting pregnancy 01/14/2016    Allergies:  Allergies  Allergen Reactions   Azithromycin Anaphylaxis   Amoxicillin Hives    Has patient had a PCN reaction causing immediate rash, facial/tongue/throat swelling, SOB or lightheadedness with hypotension: No Has patient had a PCN reaction causing severe rash involving mucus  membranes or skin necrosis: YEs Has patient had a PCN reaction that required hospitalization: No Has patient had a PCN reaction occurring within the last 10 years: No If all of the above answers are "NO", then may proceed with Cephalosporin use.   Clindamycin Hives   Ibuprofen Hives   Tramadol Hives   Medications:  Current Outpatient Medications:    moxifloxacin (AVELOX) 400 MG tablet, Take 1 tablet (400 mg total) by mouth daily for 7 days., Disp: 7 tablet, Rfl: 0   naproxen (NAPROSYN) 500 MG tablet, Take 1 tablet (500 mg total) by mouth 2 (two) times daily with a meal., Disp: 30 tablet, Rfl: 0   benzonatate (TESSALON PERLES) 100 MG capsule, Take 1 capsule (100 mg total) by mouth 3 (three) times daily as needed., Disp: 20 capsule, Rfl: 0   cetirizine (ZYRTEC ALLERGY) 10 MG tablet, Take 1 tablet (10 mg total) by mouth daily., Disp: 90 tablet, Rfl: 1   cholecalciferol (VITAMIN D3) 25 MCG (1000 UNIT) tablet, Take 1,000 Units by mouth daily., Disp: , Rfl:    cyclobenzaprine (FLEXERIL) 10 MG tablet, Take 1 tablet (10 mg total) by mouth 3 (three) times daily as needed for muscle spasms., Disp: 10 tablet, Rfl: 0   doxycycline (VIBRA-TABS) 100 MG tablet, Take 1 tablet (100 mg total) by mouth 2 (two) times daily., Disp: 20 tablet, Rfl: 0   ferrous sulfate 325 (65 FE) MG EC tablet, Take 325 mg by mouth daily., Disp: , Rfl:    fluticasone (FLONASE) 50 MCG/ACT nasal spray, Place 2 sprays into both nostrils daily., Disp: 16 g, Rfl: 6   levETIRAcetam (KEPPRA) 1000 MG tablet, Take 1 tablet (1,000 mg total) by mouth 2 (two) times daily., Disp: 60 tablet, Rfl: 11   predniSONE (DELTASONE) 20 MG tablet, Take 2 tablets (40 mg total) by mouth daily with breakfast., Disp: 10 tablet, Rfl: 0  Observations/Objective: Patient is well-developed, well-nourished in no acute distress.  Resting comfortably at home.  Head is normocephalic, atraumatic.  No labored breathing.  Speech is clear and coherent with logical  content.  Patient is alert and oriented at baseline.  Swelling noted mildly on left side of face  Assessment and Plan: 1. Dental infection - moxifloxacin (AVELOX) 400 MG tablet; Take 1 tablet (400 mg total) by mouth daily for 7 days.  Dispense: 7 tablet; Refill: 0 - naproxen (NAPROSYN) 500 MG tablet; Take 1 tablet (500 mg total) by mouth 2 (two) times daily with a meal.  Dispense: 30 tablet; Refill: 0  - Suspected infection with broken tooth - Avelox and ibuprofen prescribed - Can use ice on outside jaw/cheek for swelling - Can also take tylenol for pain with other medications - Discussed DenTemp putty that can be used to cover a broken tooth - Schedule a follow with a dentist as soon as possible (Can contact [facility] or [facility] [phone]] associated with [facility] department if underinsured or uninsured) - Seek in person evaluation if symptoms fail to improve or if they worsen   Follow Up Instructions: I discussed the assessment and treatment plan with the patient. The patient was  provided an opportunity to ask questions and all were answered. The patient agreed with the plan and demonstrated an understanding of the instructions.  A copy of instructions were sent to the patient via MyChart unless otherwise noted below.    The patient was advised to call back or seek an in-person evaluation if the symptoms worsen or if the condition fails to improve as anticipated.  Time:  I spent 12 minutes with the patient via telehealth technology discussing the above problems/concerns.    [name], PA-C

## 2022-03-07 ENCOUNTER — Other Ambulatory Visit: Payer: Self-pay | Admitting: Physician Assistant

## 2022-03-07 DIAGNOSIS — B9689 Other specified bacterial agents as the cause of diseases classified elsewhere: Secondary | ICD-10-CM

## 2022-03-08 ENCOUNTER — Telehealth: Payer: Medicaid Other

## 2022-03-09 ENCOUNTER — Telehealth: Payer: Medicaid Other | Admitting: Physician Assistant

## 2022-03-09 DIAGNOSIS — J019 Acute sinusitis, unspecified: Secondary | ICD-10-CM

## 2022-03-09 DIAGNOSIS — B9689 Other specified bacterial agents as the cause of diseases classified elsewhere: Secondary | ICD-10-CM | POA: Diagnosis not present

## 2022-03-09 MED ORDER — AZELASTINE HCL 0.1 % NA SOLN: 1.0000 | Freq: Two times a day (BID) | NASAL | 0 refills | Status: DC

## 2022-03-09 MED ORDER — PROMETHAZINE-DM 6.25-15 MG/5ML PO SYRP: 5.0000 mL | ORAL_SOLUTION | Freq: Four times a day (QID) | ORAL | 0 refills | Status: DC | PRN

## 2022-03-09 MED ORDER — DOXYCYCLINE HYCLATE 100 MG PO TABS: 100.0000 mg | ORAL_TABLET | Freq: Two times a day (BID) | ORAL | 0 refills | Status: DC

## 2022-03-09 NOTE — Progress Notes (Signed)
Virtual Visit Consent   Gabriella Montgomery, you are scheduled for a virtual visit with a [facility] provider today. Just as with appointments in the office, your consent must be obtained to participate. Your consent will be active for this visit and any virtual visit you may have with one of our providers in the next 365 days. If you have a MyChart account, a copy of this consent can be sent to you electronically.  As this is a virtual visit, video technology does not allow for your provider to perform a traditional examination. This may limit your provider's ability to fully assess your condition. If your provider identifies any concerns that need to be evaluated in person or the need to arrange testing (such as labs, EKG, etc.), we will make arrangements to do so. Although advances in technology are sophisticated, we cannot ensure that it will always work on either your end or our end. If the connection with a video visit is poor, the visit may have to be switched to a telephone visit. With either a video or telephone visit, we are not always able to ensure that we have a secure connection.  By engaging in this virtual visit, you consent to the provision of healthcare and authorize for your insurance to be billed (if applicable) for the services provided during this visit. Depending on your insurance coverage, you may receive a charge related to this service.  I need to obtain your verbal consent now. Are you willing to proceed with your visit today? Gabriella Montgomery has provided verbal consent on 03/09/2022 for a virtual visit (video or telephone). [name], PA-C  Date: 03/09/2022 9:54 AM  Virtual Visit via Video Note   I, [name], connected with  Gabriella Montgomery  ([id number], 1985-08-18) on 03/09/22 at  9:45 AM EST by a video-enabled telemedicine application and verified that I am speaking with the correct person using two identifiers.  Location: Patient: Virtual Visit  Location Patient: Home Provider: Virtual Visit Location Provider: Home Office   I discussed the limitations of evaluation and management by telemedicine and the availability of in person appointments. The patient expressed understanding and agreed to proceed.    History of Present Illness: Gabriella Montgomery is a 36 y.o. who identifies as a female who was assigned female at birth, and is being seen today for possible sinus infection.  HPI: Sinusitis This is a new problem. The problem has been gradually worsening since onset. The maximum temperature recorded prior to her arrival was more than 104 F (Reports on Saturday fever went to 105 and was seen at a hospital and told to take tylenol for fevers). The pain is moderate. Associated symptoms include chills, congestion, coughing, diaphoresis, ear pain, headaches, a hoarse voice, sinus pressure and a sore throat (this morning). (Nasal congestion, myalgias) Past treatments include acetaminophen, saline nose sprays and nasal decongestants. The treatment provided no relief.     Problems:  Patient Active Problem List   Diagnosis Date Noted   Vitamin D deficiency 10/12/2019   Seizure disorder (HCC) 08/18/2019   Migraine without aura and without status migrainosus, not intractable 08/18/2019   Class 3 severe obesity due to excess calories without serious comorbidity with body mass index (BMI) of 45.0 to 49.9 in adult [facility]) 08/18/2019   Normochromic anemia 08/18/2019   Pneumonia due to COVID-19 virus 07/19/2019   Seizure-like activity (HCC) 07/14/2019   History of pulmonary embolism 02/17/2016   Pulmonary embolism affecting pregnancy  01/14/2016    Allergies:  Allergies  Allergen Reactions   Azithromycin Anaphylaxis   Amoxicillin Hives    Has patient had a PCN reaction causing immediate rash, facial/tongue/throat swelling, SOB or lightheadedness with hypotension: No Has patient had a PCN reaction causing severe rash involving mucus membranes or  skin necrosis: YEs Has patient had a PCN reaction that required hospitalization: No Has patient had a PCN reaction occurring within the last 10 years: No If all of the above answers are "NO", then may proceed with Cephalosporin use.   Clindamycin Hives   Ibuprofen Hives   Tramadol Hives   Medications:  Current Outpatient Medications:    azelastine (ASTELIN) 0.1 % nasal spray, Place 1 spray into both nostrils 2 (two) times daily., Disp: 30 mL, Rfl: 0   doxycycline (VIBRA-TABS) 100 MG tablet, Take 1 tablet (100 mg total) by mouth 2 (two) times daily., Disp: 20 tablet, Rfl: 0   promethazine-dextromethorphan (PROMETHAZINE-DM) 6.25-15 MG/5ML syrup, Take 5 mLs by mouth 4 (four) times daily as needed., Disp: 118 mL, Rfl: 0   cetirizine (ZYRTEC ALLERGY) 10 MG tablet, Take 1 tablet (10 mg total) by mouth daily., Disp: 90 tablet, Rfl: 1   cholecalciferol (VITAMIN D3) 25 MCG (1000 UNIT) tablet, Take 1,000 Units by mouth daily., Disp: , Rfl:    cyclobenzaprine (FLEXERIL) 10 MG tablet, Take 1 tablet (10 mg total) by mouth 3 (three) times daily as needed for muscle spasms., Disp: 10 tablet, Rfl: 0   ferrous sulfate 325 (65 FE) MG EC tablet, Take 325 mg by mouth daily., Disp: , Rfl:    fluticasone (FLONASE) 50 MCG/ACT nasal spray, Place 2 sprays into both nostrils daily., Disp: 16 g, Rfl: 6   levETIRAcetam (KEPPRA) 1000 MG tablet, Take 1 tablet (1,000 mg total) by mouth 2 (two) times daily., Disp: 60 tablet, Rfl: 11  Observations/Objective: Patient is well-developed, well-nourished in no acute distress.  Resting comfortably at home.  Head is normocephalic, atraumatic.  No labored breathing.  Speech is clear and coherent with logical content.  Patient is alert and oriented at baseline.    Assessment and Plan: 1. Acute bacterial sinusitis - doxycycline (VIBRA-TABS) 100 MG tablet; Take 1 tablet (100 mg total) by mouth 2 (two) times daily.  Dispense: 20 tablet; Refill: 0 - azelastine (ASTELIN) 0.1 %  nasal spray; Place 1 spray into both nostrils 2 (two) times daily.  Dispense: 30 mL; Refill: 0 - promethazine-dextromethorphan (PROMETHAZINE-DM) 6.25-15 MG/5ML syrup; Take 5 mLs by mouth 4 (four) times daily as needed.  Dispense: 118 mL; Refill: 0  - Worsening symptoms that have not responded to OTC medications.  - Will give Doxycycline - Azelastine nasal spray for congestion and drainage - Promethazine DM for cough - Continue allergy medications.  - Steam and humidifier can help - Stay well hydrated and get plenty of rest.  - Seek in person evaluation if no symptom improvement or if symptoms worsen   Follow Up Instructions: I discussed the assessment and treatment plan with the patient. The patient was provided an opportunity to ask questions and all were answered. The patient agreed with the plan and demonstrated an understanding of the instructions.  A copy of instructions were sent to the patient via MyChart unless otherwise noted below.    The patient was advised to call back or seek an in-person evaluation if the symptoms worsen or if the condition fails to improve as anticipated.  Time:  I spent 8 minutes with the patient via telehealth technology  discussing the above problems/concerns.    Komal Stangelo M Manika Hast, PA-C  

## 2022-03-09 NOTE — Patient Instructions (Signed)
Gabriella Montgomery, thank you for joining [name], PA-C for today's virtual visit.  While this provider is not your primary care provider (PCP), if your PCP is located in our provider database this encounter information will be shared with them immediately following your visit.   A [facility] MyChart account gives you access to today's visit and all your visits, tests, and labs performed at [facility] " click here if you don't have a [facility] MyChart account or go to [email or web address]  Consent: (Patient) Gabriella Montgomery provided verbal consent for this virtual visit at the beginning of the encounter.  Current Medications:  Current Outpatient Medications:    azelastine (ASTELIN) 0.1 % nasal spray, Place 1 spray into both nostrils 2 (two) times daily., Disp: 30 mL, Rfl: 0   doxycycline (VIBRA-TABS) 100 MG tablet, Take 1 tablet (100 mg total) by mouth 2 (two) times daily., Disp: 20 tablet, Rfl: 0   promethazine-dextromethorphan (PROMETHAZINE-DM) 6.25-15 MG/5ML syrup, Take 5 mLs by mouth 4 (four) times daily as needed., Disp: 118 mL, Rfl: 0   cetirizine (ZYRTEC ALLERGY) 10 MG tablet, Take 1 tablet (10 mg total) by mouth daily., Disp: 90 tablet, Rfl: 1   cholecalciferol (VITAMIN D3) 25 MCG (1000 UNIT) tablet, Take 1,000 Units by mouth daily., Disp: , Rfl:    cyclobenzaprine (FLEXERIL) 10 MG tablet, Take 1 tablet (10 mg total) by mouth 3 (three) times daily as needed for muscle spasms., Disp: 10 tablet, Rfl: 0   ferrous sulfate 325 (65 FE) MG EC tablet, Take 325 mg by mouth daily., Disp: , Rfl:    fluticasone (FLONASE) 50 MCG/ACT nasal spray, Place 2 sprays into both nostrils daily., Disp: 16 g, Rfl: 6   levETIRAcetam (KEPPRA) 1000 MG tablet, Take 1 tablet (1,000 mg total) by mouth 2 (two) times daily., Disp: 60 tablet, Rfl: 11   Medications ordered in this encounter:  Meds ordered this encounter  Medications   doxycycline (VIBRA-TABS) 100 MG tablet    Sig:  Take 1 tablet (100 mg total) by mouth 2 (two) times daily.    Dispense:  20 tablet    Refill:  0    Order Specific Question:   Supervising Provider    Answer:   Merrilee Jansky [[id number]]   azelastine (ASTELIN) 0.1 % nasal spray    Sig: Place 1 spray into both nostrils 2 (two) times daily.    Dispense:  30 mL    Refill:  0    Order Specific Question:   Supervising Provider    Answer:   Merrilee Jansky [id number]   promethazine-dextromethorphan (PROMETHAZINE-DM) 6.25-15 MG/5ML syrup    Sig: Take 5 mLs by mouth 4 (four) times daily as needed.    Dispense:  118 mL    Refill:  0    Order Specific Question:   Supervising Provider    Answer:   Merrilee Jansky [[id number]]     *If you need refills on other medications prior to your next appointment, please contact your pharmacy*  Follow-Up: Call back or seek an in-person evaluation if the symptoms worsen or if the condition fails to improve as anticipated.  [facility] [phone]  Other Instructions Sinus Infection, Adult A sinus infection, also called sinusitis, is inflammation of your sinuses. Sinuses are hollow spaces in the bones around your face. Your sinuses are located: Around your eyes. In the middle of your forehead. Behind your nose. In your cheekbones. Mucus normally drains out of  your sinuses. When your nasal tissues become inflamed or swollen, mucus can become trapped or blocked. This allows bacteria, viruses, and fungi to grow, which leads to infection. Most infections of the sinuses are caused by a virus. A sinus infection can develop quickly. It can last for up to 4 weeks (acute) or for more than 12 weeks (chronic). A sinus infection often develops after a cold. What are the causes? This condition is caused by anything that creates swelling in the sinuses or stops mucus from draining. This includes: Allergies. Asthma. Infection from bacteria or viruses. Deformities or blockages in your nose or  sinuses. Abnormal growths in the nose (nasal polyps). Pollutants, such as chemicals or irritants in the air. Infection from fungi. This is rare. What increases the risk? You are more likely to develop this condition if you: Have a weak body defense system (immune system). Do a lot of swimming or diving. Overuse nasal sprays. Smoke. What are the signs or symptoms? The main symptoms of this condition are pain and a feeling of pressure around the affected sinuses. Other symptoms include: Stuffy nose or congestion that makes it difficult to breathe through your nose. Thick yellow or greenish drainage from your nose. Tenderness, swelling, and warmth over the affected sinuses. A cough that may get worse at night. Decreased sense of smell and taste. Extra mucus that collects in the throat or the back of the nose (postnasal drip) causing a sore throat or bad breath. Tiredness (fatigue). Fever. How is this diagnosed? This condition is diagnosed based on: Your symptoms. Your medical history. A physical exam. Tests to find out if your condition is acute or chronic. This may include: Checking your nose for nasal polyps. Viewing your sinuses using a device that has a light (endoscope). Testing for allergies or bacteria. Imaging tests, such as an MRI or CT scan. In rare cases, a bone biopsy may be done to rule out more serious types of fungal sinus disease. How is this treated? Treatment for a sinus infection depends on the cause and whether your condition is chronic or acute. If caused by a virus, your symptoms should go away on their own within 10 days. You may be given medicines to relieve symptoms. They include: Medicines that shrink swollen nasal passages (decongestants). A spray that eases inflammation of the nostrils (topical intranasal corticosteroids). Rinses that help get rid of thick mucus in your nose (nasal saline washes). Medicines that treat allergies  (antihistamines). Over-the-counter pain relievers. If caused by bacteria, your health care provider may recommend waiting to see if your symptoms improve. Most bacterial infections will get better without antibiotic medicine. You may be given antibiotics if you have: A severe infection. A weak immune system. If caused by narrow nasal passages or nasal polyps, surgery may be needed. Follow these instructions at home: Medicines Take, use, or apply over-the-counter and prescription medicines only as told by your health care provider. These may include nasal sprays. If you were prescribed an antibiotic medicine, take it as told by your health care provider. Do not stop taking the antibiotic even if you start to feel better. Hydrate and humidify  Drink enough fluid to keep your urine pale yellow. Staying hydrated will help to thin your mucus. Use a cool mist humidifier to keep the humidity level in your home above 50%. Inhale steam for 10-15 minutes, 3-4 times a day, or as told by your health care provider. You can do this in the bathroom while a hot shower  is running. Limit your exposure to cool or dry air. Rest Rest as much as possible. Sleep with your head raised (elevated). Make sure you get enough sleep each night. General instructions  Apply a warm, moist washcloth to your face 3-4 times a day or as told by your health care provider. This will help with discomfort. Use nasal saline washes as often as told by your health care provider. Wash your hands often with soap and water to reduce your exposure to germs. If soap and water are not available, use hand sanitizer. Do not smoke. Avoid being around people who are smoking (secondhand smoke). Keep all follow-up visits. This is important. Contact a health care provider if: You have a fever. Your symptoms get worse. Your symptoms do not improve within 10 days. Get help right away if: You have a severe headache. You have persistent  vomiting. You have severe pain or swelling around your face or eyes. You have vision problems. You develop confusion. Your neck is stiff. You have trouble breathing. These symptoms may be an emergency. Get help right away. Call 911. Do not wait to see if the symptoms will go away. Do not drive yourself to the hospital. Summary A sinus infection is soreness and inflammation of your sinuses. Sinuses are hollow spaces in the bones around your face. This condition is caused by nasal tissues that become inflamed or swollen. The swelling traps or blocks the flow of mucus. This allows bacteria, viruses, and fungi to grow, which leads to infection. If you were prescribed an antibiotic medicine, take it as told by your health care provider. Do not stop taking the antibiotic even if you start to feel better. Keep all follow-up visits. This is important. This information is not intended to replace advice given to you by your health care provider. Make sure you discuss any questions you have with your health care provider. Document Revised: 03/18/2021 Document Reviewed: 03/18/2021 Elsevier Patient Education  2023 Elsevier Inc.    If you have been instructed to have an in-person evaluation today at a local Urgent Care facility, please use the link below. It will take you to a list of all of our available [facility], including address, phone number and hours of operation. Please do not delay care.  [facility]  If you or a family member do not have a primary care provider, use the link below to schedule a visit and establish care. When you choose a [facility] primary care physician or advanced practice provider, you gain a long-term partner in health. Find a Primary Care Provider  Learn more about [facility]'s in-office and virtual care options: [facility] Get Care Now

## 2022-03-13 ENCOUNTER — Telehealth: Payer: Medicaid Other | Admitting: Physician Assistant

## 2022-03-13 DIAGNOSIS — J069 Acute upper respiratory infection, unspecified: Secondary | ICD-10-CM | POA: Diagnosis not present

## 2022-03-13 DIAGNOSIS — B9689 Other specified bacterial agents as the cause of diseases classified elsewhere: Secondary | ICD-10-CM | POA: Diagnosis not present

## 2022-03-13 NOTE — Patient Instructions (Signed)
  Gabriella Montgomery, thank you for joining [name], PA-C for today's virtual visit.  While this provider is not your primary care provider (PCP), if your PCP is located in our provider database this encounter information will be shared with them immediately following your visit.   A [facility] MyChart account gives you access to today's visit and all your visits, tests, and labs performed at [facility] " click here if you don't have a [facility] MyChart account or go to [email or web address]  Consent: (Patient) Gabriella Montgomery provided verbal consent for this virtual visit at the beginning of the encounter.  Current Medications:  Current Outpatient Medications:    azelastine (ASTELIN) 0.1 % nasal spray, Place 1 spray into both nostrils 2 (two) times daily., Disp: 30 mL, Rfl: 0   cetirizine (ZYRTEC ALLERGY) 10 MG tablet, Take 1 tablet (10 mg total) by mouth daily., Disp: 90 tablet, Rfl: 1   cholecalciferol (VITAMIN D3) 25 MCG (1000 UNIT) tablet, Take 1,000 Units by mouth daily., Disp: , Rfl:    cyclobenzaprine (FLEXERIL) 10 MG tablet, Take 1 tablet (10 mg total) by mouth 3 (three) times daily as needed for muscle spasms., Disp: 10 tablet, Rfl: 0   doxycycline (VIBRA-TABS) 100 MG tablet, Take 1 tablet (100 mg total) by mouth 2 (two) times daily., Disp: 20 tablet, Rfl: 0   ferrous sulfate 325 (65 FE) MG EC tablet, Take 325 mg by mouth daily., Disp: , Rfl:    fluticasone (FLONASE) 50 MCG/ACT nasal spray, Place 2 sprays into both nostrils daily., Disp: 16 g, Rfl: 6   levETIRAcetam (KEPPRA) 1000 MG tablet, Take 1 tablet (1,000 mg total) by mouth 2 (two) times daily., Disp: 60 tablet, Rfl: 11   promethazine-dextromethorphan (PROMETHAZINE-DM) 6.25-15 MG/5ML syrup, Take 5 mLs by mouth 4 (four) times daily as needed., Disp: 118 mL, Rfl: 0   Medications ordered in this encounter:  No orders of the defined types were placed in this encounter.    *If you need refills on  other medications prior to your next appointment, please contact your pharmacy*  Follow-Up: Call back or seek an in-person evaluation if the symptoms worsen or if the condition fails to improve as anticipated.  [facility] [phone]   If you have been instructed to have an in-person evaluation today at a local Urgent Care facility, please use the link below. It will take you to a list of all of our available [facility], including address, phone number and hours of operation. Please do not delay care.  [facility]  If you or a family member do not have a primary care provider, use the link below to schedule a visit and establish care. When you choose a [facility] primary care physician or advanced practice provider, you gain a long-term partner in health. Find a Primary Care Provider  Learn more about [facility]'s in-office and virtual care options: [facility] - Get Care Now

## 2022-03-13 NOTE — Progress Notes (Signed)
Virtual Visit Consent   Gabriella Montgomery, you are scheduled for a virtual visit with a [place] provider today. Just as with appointments in the office, your consent must be obtained to participate. Your consent will be active for this visit and any virtual visit you may have with one of our providers in the next 365 days. If you have a MyChart account, a copy of this consent can be sent to you electronically.  As this is a virtual visit, video technology does not allow for your provider to perform a traditional examination. This may limit your provider's ability to fully assess your condition. If your provider identifies any concerns that need to be evaluated in person or the need to arrange testing (such as labs, EKG, etc.), we will make arrangements to do so. Although advances in technology are sophisticated, we cannot ensure that it will always work on either your end or our end. If the connection with a video visit is poor, the visit may have to be switched to a telephone visit. With either a video or telephone visit, we are not always able to ensure that we have a secure connection.  By engaging in this virtual visit, you consent to the provision of healthcare and authorize for your insurance to be billed (if applicable) for the services provided during this visit. Depending on your insurance coverage, you may receive a charge related to this service.  I need to obtain your verbal consent now. Are you willing to proceed with your visit today? Gabriella Montgomery has provided verbal consent on 03/13/2022 for a virtual visit (video or telephone). Gabriella Loveless, PA-C  Date: 03/13/2022 9:20 AM  Virtual Visit via Video Note   I, Gabriella Montgomery, connected with  Gabriella Montgomery  ([id number], 11/01/1985) on 03/13/22 at  9:15 AM EST by a video-enabled telemedicine application and verified that I am speaking with the correct person using two identifiers.  Location: Patient: Virtual Visit  Location Patient: Home Provider: Virtual Visit Location Provider: Home Office   I discussed the limitations of evaluation and management by telemedicine and the availability of in person appointments. The patient expressed understanding and agreed to proceed.    History of Present Illness: Gabriella Montgomery is a 36 y.o. who identifies as a female who was assigned female at birth, and is being seen today for continued and worsening ear pain and throat pain despite being on Doxycycline for a bacterial sinus infection. Having increased ear pain, throat pain, and body aches that are affecting sleep.   Problems:  Patient Active Problem List   Diagnosis Date Noted   Vitamin D deficiency 10/12/2019   Seizure disorder (HCC) 08/18/2019   Migraine without aura and without status migrainosus, not intractable 08/18/2019   Class 3 severe obesity due to excess calories without serious comorbidity with body mass index (BMI) of 45.0 to 49.9 in adult [facility]) 08/18/2019   Normochromic anemia 08/18/2019   Pneumonia due to COVID-19 virus 07/19/2019   Seizure-like activity (HCC) 07/14/2019   History of pulmonary embolism 02/17/2016   Pulmonary embolism affecting pregnancy 01/14/2016    Allergies:  Allergies  Allergen Reactions   Azithromycin Anaphylaxis   Amoxicillin Hives    Has patient had a PCN reaction causing immediate rash, facial/tongue/throat swelling, SOB or lightheadedness with hypotension: No Has patient had a PCN reaction causing severe rash involving mucus membranes or skin necrosis: YEs Has patient had a PCN reaction that required hospitalization: No Has patient had  a PCN reaction occurring within the last 10 years: No If all of the above answers are "NO", then may proceed with Cephalosporin use.   Clindamycin Hives   Ibuprofen Hives   Tramadol Hives   Medications:  Current Outpatient Medications:    azelastine (ASTELIN) 0.1 % nasal spray, Place 1 spray into both nostrils 2 (two) times  daily., Disp: 30 mL, Rfl: 0   cetirizine (ZYRTEC ALLERGY) 10 MG tablet, Take 1 tablet (10 mg total) by mouth daily., Disp: 90 tablet, Rfl: 1   cholecalciferol (VITAMIN D3) 25 MCG (1000 UNIT) tablet, Take 1,000 Units by mouth daily., Disp: , Rfl:    cyclobenzaprine (FLEXERIL) 10 MG tablet, Take 1 tablet (10 mg total) by mouth 3 (three) times daily as needed for muscle spasms., Disp: 10 tablet, Rfl: 0   doxycycline (VIBRA-TABS) 100 MG tablet, Take 1 tablet (100 mg total) by mouth 2 (two) times daily., Disp: 20 tablet, Rfl: 0   ferrous sulfate 325 (65 FE) MG EC tablet, Take 325 mg by mouth daily., Disp: , Rfl:    fluticasone (FLONASE) 50 MCG/ACT nasal spray, Place 2 sprays into both nostrils daily., Disp: 16 g, Rfl: 6   levETIRAcetam (KEPPRA) 1000 MG tablet, Take 1 tablet (1,000 mg total) by mouth 2 (two) times daily., Disp: 60 tablet, Rfl: 11   promethazine-dextromethorphan (PROMETHAZINE-DM) 6.25-15 MG/5ML syrup, Take 5 mLs by mouth 4 (four) times daily as needed., Disp: 118 mL, Rfl: 0  Observations/Objective: Patient is well-developed, well-nourished in no acute distress.  Resting comfortably at home.  Head is normocephalic, atraumatic.  No labored breathing.  Speech is clear and coherent with logical content.  Patient is alert and oriented at baseline.    Assessment and Plan: 1. Bacterial upper respiratory infection  - Since symptoms are worsening and on ABx treatment, patient advised to seek in person evaluation at a local UC facility  Follow Up Instructions: I discussed the assessment and treatment plan with the patient. The patient was provided an opportunity to ask questions and all were answered. The patient agreed with the plan and demonstrated an understanding of the instructions.  A copy of instructions were sent to the patient via MyChart unless otherwise noted below.    The patient was advised to call back or seek an in-person evaluation if the symptoms worsen or if the  condition fails to improve as anticipated.  Time:  I spent 7 minutes with the patient via telehealth technology discussing the above problems/concerns.    Gabriella Loveless, PA-C

## 2022-04-06 ENCOUNTER — Other Ambulatory Visit: Payer: Self-pay | Admitting: Physician Assistant

## 2022-04-06 DIAGNOSIS — B9689 Other specified bacterial agents as the cause of diseases classified elsewhere: Secondary | ICD-10-CM

## 2022-04-08 ENCOUNTER — Telehealth: Payer: Medicaid Other | Admitting: Physician Assistant

## 2022-04-08 DIAGNOSIS — J208 Acute bronchitis due to other specified organisms: Secondary | ICD-10-CM

## 2022-04-08 DIAGNOSIS — B9689 Other specified bacterial agents as the cause of diseases classified elsewhere: Secondary | ICD-10-CM

## 2022-04-08 MED ORDER — PROMETHAZINE-DM 6.25-15 MG/5ML PO SYRP: 5.0000 mL | ORAL_SOLUTION | Freq: Four times a day (QID) | ORAL | 0 refills | Status: DC | PRN

## 2022-04-08 MED ORDER — ALBUTEROL SULFATE HFA 108 (90 BASE) MCG/ACT IN AERS: 2.0000 | INHALATION_SPRAY | Freq: Four times a day (QID) | RESPIRATORY_TRACT | 0 refills | Status: DC | PRN

## 2022-04-08 MED ORDER — SULFAMETHOXAZOLE-TRIMETHOPRIM 800-160 MG PO TABS: 1.0000 | ORAL_TABLET | Freq: Two times a day (BID) | ORAL | 0 refills | Status: DC

## 2022-04-08 MED ORDER — BENZONATATE 100 MG PO CAPS: 100.0000 mg | ORAL_CAPSULE | Freq: Three times a day (TID) | ORAL | 0 refills | Status: DC | PRN

## 2022-04-08 NOTE — Patient Instructions (Signed)
Gabriella Montgomery, thank you for joining Gabriella Climes, PA-C for today's virtual visit.  While this provider is not your primary care provider (PCP), if your PCP is located in our provider database this encounter information will be shared with them immediately following your visit.   A [facility] MyChart account gives you access to today's visit and all your visits, tests, and labs performed at [facility] " click here if you don't have a [facility] MyChart account or go to [email or web address]  Consent: (Patient) Gabriella Montgomery provided verbal consent for this virtual visit at the beginning of the encounter.  Current Medications:  Current Outpatient Medications:    azelastine (ASTELIN) 0.1 % nasal spray, Place 1 spray into both nostrils 2 (two) times daily., Disp: 30 mL, Rfl: 0   cetirizine (ZYRTEC ALLERGY) 10 MG tablet, Take 1 tablet (10 mg total) by mouth daily., Disp: 90 tablet, Rfl: 1   cholecalciferol (VITAMIN D3) 25 MCG (1000 UNIT) tablet, Take 1,000 Units by mouth daily., Disp: , Rfl:    cyclobenzaprine (FLEXERIL) 10 MG tablet, Take 1 tablet (10 mg total) by mouth 3 (three) times daily as needed for muscle spasms., Disp: 10 tablet, Rfl: 0   doxycycline (VIBRA-TABS) 100 MG tablet, Take 1 tablet (100 mg total) by mouth 2 (two) times daily., Disp: 20 tablet, Rfl: 0   ferrous sulfate 325 (65 FE) MG EC tablet, Take 325 mg by mouth daily., Disp: , Rfl:    fluticasone (FLONASE) 50 MCG/ACT nasal spray, Place 2 sprays into both nostrils daily., Disp: 16 g, Rfl: 6   levETIRAcetam (KEPPRA) 1000 MG tablet, Take 1 tablet (1,000 mg total) by mouth 2 (two) times daily., Disp: 60 tablet, Rfl: 11   promethazine-dextromethorphan (PROMETHAZINE-DM) 6.25-15 MG/5ML syrup, Take 5 mLs by mouth 4 (four) times daily as needed., Disp: 118 mL, Rfl: 0   Medications ordered in this encounter:  No orders of the defined types were placed in this encounter.    *If you need refills on  other medications prior to your next appointment, please contact your pharmacy*  Follow-Up: Call back or seek an in-person evaluation if the symptoms worsen or if the condition fails to improve as anticipated.  [facility] [phone]  Other Instructions Take antibiotic (Bactrim) as directed.  Increase fluids.  Get plenty of rest. Use Mucinex for congestion.  Use the Tessalon as directed for cough. Take a daily probiotic (I recommend Align or Culturelle, but even Activia Yogurt may be beneficial).  A humidifier placed in the bedroom may offer some relief for a dry, scratchy throat of nasal irritation.  Read information below on acute bronchitis.   Again if symptoms or not resolving or you note anything worsening despite treatment, you need to be evaluated in person so that you can get a detailed lung examination.  Acute Bronchitis Bronchitis is when the airways that extend from the windpipe into the lungs get red, puffy, and painful (inflamed). Bronchitis often causes thick spit (mucus) to develop. This leads to a cough. A cough is the most common symptom of bronchitis. In acute bronchitis, the condition usually begins suddenly and goes away over time (usually in 2 weeks). Smoking, allergies, and asthma can make bronchitis worse. Repeated episodes of bronchitis may cause more lung problems.  HOME CARE Rest. Drink enough fluids to keep your pee (urine) clear or pale yellow (unless you need to limit fluids as told by your doctor). Only take over-the-counter or prescription medicines as  told by your doctor. Avoid smoking and secondhand smoke. These can make bronchitis worse. If you are a smoker, think about using nicotine gum or skin patches. Quitting smoking will help your lungs heal faster. Reduce the chance of getting bronchitis again by: Washing your hands often. Avoiding people with cold symptoms. Trying not to touch your hands to your mouth, nose, or eyes. Follow up with  your doctor as told.  GET HELP IF: Your symptoms do not improve after 1 week of treatment. Symptoms include: Cough. Fever. Coughing up thick spit. Body aches. Chest congestion. Chills. Shortness of breath. Sore throat.  GET HELP RIGHT AWAY IF:  You have an increased fever. You have chills. You have severe shortness of breath. You have bloody thick spit (sputum). You throw up (vomit) often. You lose too much body fluid (dehydration). You have a severe headache. You faint.  MAKE SURE YOU:  Understand these instructions. Will watch your condition. Will get help right away if you are not doing well or get worse. Document Released: 09/30/2007 Document Revised: 12/14/2012 Document Reviewed: 10/04/2012 [facility] Patient Information 2015 [place], [place]. This information is not intended to replace advice given to you by your health care provider. Make sure you discuss any questions you have with your health care provider.    If you have been instructed to have an in-person evaluation today at a local Urgent Care facility, please use the link below. It will take you to a list of all of our available [facility], including address, phone number and hours of operation. Please do not delay care.  [facility]  If you or a family member do not have a primary care provider, use the link below to schedule a visit and establish care. When you choose a [place] primary care physician or advanced practice provider, you gain a long-term partner in health. Find a Primary Care Provider  Learn more about [place]'s in-office and virtual care options:  - Get Care Now

## 2022-04-08 NOTE — Progress Notes (Signed)
Virtual Visit Consent   Gabriella Montgomery, you are scheduled for a virtual visit with a [facility] provider today. Just as with appointments in the office, your consent must be obtained to participate. Your consent will be active for this visit and any virtual visit you may have with one of our providers in the next 365 days. If you have a MyChart account, a copy of this consent can be sent to you electronically.  As this is a virtual visit, video technology does not allow for your provider to perform a traditional examination. This may limit your provider's ability to fully assess your condition. If your provider identifies any concerns that need to be evaluated in person or the need to arrange testing (such as labs, EKG, etc.), we will make arrangements to do so. Although advances in technology are sophisticated, we cannot ensure that it will always work on either your end or our end. If the connection with a video visit is poor, the visit may have to be switched to a telephone visit. With either a video or telephone visit, we are not always able to ensure that we have a secure connection.  By engaging in this virtual visit, you consent to the provision of healthcare and authorize for your insurance to be billed (if applicable) for the services provided during this visit. Depending on your insurance coverage, you may receive a charge related to this service.  I need to obtain your verbal consent now. Are you willing to proceed with your visit today? Gabriella Montgomery has provided verbal consent on 04/08/2022 for a virtual visit (video or telephone). [name], [place]  Date: 04/08/2022 10:16 AM  Virtual Visit via Video Note   I, [name], connected with  Gabriella Montgomery  ([id number], 25-Feb-1986) on 04/08/22 at 10:15 AM EST by a video-enabled telemedicine application and verified that I am speaking with the correct person using two identifiers.  Location: Patient: Virtual Visit  Location Patient: Home Provider: Virtual Visit Location Provider: Home Office   I discussed the limitations of evaluation and management by telemedicine and the availability of in person appointments. The patient expressed understanding and agreed to proceed.    History of Present Illness: Gabriella Montgomery is a 36 y.o. who identifies as a female who was assigned female at birth, and is being seen today for recurrence of upper respiratory symptoms over the past couple of weeks after recovering from a sinus infection last month.  Patient endorses persistent cough that is sometimes dry but at other times productive of green/brown sputum.  Denies fever or chills.  Some windedness during a long coughing spell only.  Denies chest pain.  Denies sinus pain or tooth pain.  Is taking OTC cough medication with not much improvement in her symptoms.  Denies concern for pregnancy.  HPI: HPI  Problems:  Patient Active Problem List   Diagnosis Date Noted   Vitamin D deficiency 10/12/2019   Seizure disorder (HCC) 08/18/2019   Migraine without aura and without status migrainosus, not intractable 08/18/2019   Class 3 severe obesity due to excess calories without serious comorbidity with body mass index (BMI) of 45.0 to 49.9 in adult [facility]) 08/18/2019   Normochromic anemia 08/18/2019   Pneumonia due to COVID-19 virus 07/19/2019   Seizure-like activity (HCC) 07/14/2019   History of pulmonary embolism 02/17/2016   Pulmonary embolism affecting pregnancy 01/14/2016    Allergies:  Allergies  Allergen Reactions   Azithromycin Anaphylaxis   Amoxicillin  Hives    Has patient had a PCN reaction causing immediate rash, facial/tongue/throat swelling, SOB or lightheadedness with hypotension: No Has patient had a PCN reaction causing severe rash involving mucus membranes or skin necrosis: YEs Has patient had a PCN reaction that required hospitalization: No Has patient had a PCN reaction occurring within the last 10  years: No If all of the above answers are "NO", then may proceed with Cephalosporin use.   Clindamycin Hives   Ibuprofen Hives   Tramadol Hives   Medications:  Current Outpatient Medications:    albuterol (VENTOLIN HFA) 108 (90 Base) MCG/ACT inhaler, Inhale 2 puffs into the lungs every 6 (six) hours as needed for wheezing or shortness of breath., Disp: 8 g, Rfl: 0   benzonatate (TESSALON) 100 MG capsule, Take 1 capsule (100 mg total) by mouth 3 (three) times daily as needed for cough., Disp: 30 capsule, Rfl: 0   sulfamethoxazole-trimethoprim (BACTRIM DS) 800-160 MG tablet, Take 1 tablet by mouth 2 (two) times daily., Disp: 14 tablet, Rfl: 0   cetirizine (ZYRTEC ALLERGY) 10 MG tablet, Take 1 tablet (10 mg total) by mouth daily., Disp: 90 tablet, Rfl: 1   cholecalciferol (VITAMIN D3) 25 MCG (1000 UNIT) tablet, Take 1,000 Units by mouth daily., Disp: , Rfl:    cyclobenzaprine (FLEXERIL) 10 MG tablet, Take 1 tablet (10 mg total) by mouth 3 (three) times daily as needed for muscle spasms., Disp: 10 tablet, Rfl: 0   ferrous sulfate 325 (65 FE) MG EC tablet, Take 325 mg by mouth daily., Disp: , Rfl:    fluticasone (FLONASE) 50 MCG/ACT nasal spray, Place 2 sprays into both nostrils daily., Disp: 16 g, Rfl: 6   levETIRAcetam (KEPPRA) 1000 MG tablet, Take 1 tablet (1,000 mg total) by mouth 2 (two) times daily., Disp: 60 tablet, Rfl: 11  Observations/Objective: Patient is well-developed, well-nourished in no acute distress.  Resting comfortably at home.  Head is normocephalic, atraumatic.  No labored breathing. Speech is clear and coherent with logical content.  Patient is alert and oriented at baseline.   Assessment and Plan: 1. Acute bacterial bronchitis - albuterol (VENTOLIN HFA) 108 (90 Base) MCG/ACT inhaler; Inhale 2 puffs into the lungs every 6 (six) hours as needed for wheezing or shortness of breath.  Dispense: 8 g; Refill: 0 - benzonatate (TESSALON) 100 MG capsule; Take 1 capsule (100 mg  total) by mouth 3 (three) times daily as needed for cough.  Dispense: 30 capsule; Refill: 0 - sulfamethoxazole-trimethoprim (BACTRIM DS) 800-160 MG tablet; Take 1 tablet by mouth 2 (two) times daily.  Dispense: 14 tablet; Refill: 0  Rx Bactrim.  Increase fluids.  Rest.  Saline nasal spray.  Probiotic.  Mucinex as directed.  Humidifier in bedroom.  Tessalon per orders.  If not resolving, or if she notes any new or worsening symptoms despite treatment, she is to seek an in person evaluation locally (patient currently temporarily residing in [facility])  Follow Up Instructions: I discussed the assessment and treatment plan with the patient. The patient was provided an opportunity to ask questions and all were answered. The patient agreed with the plan and demonstrated an understanding of the instructions.  A copy of instructions were sent to the patient via MyChart unless otherwise noted below.   The patient was advised to call back or seek an in-person evaluation if the symptoms worsen or if the condition fails to improve as anticipated.  Time:  I spent 10 minutes with the patient via telehealth technology  discussing the above problems/concerns.    [name], PA-C

## 2022-04-08 NOTE — Progress Notes (Signed)
Patient seen via video by this provider this morning. Is requesting different cough medication which has been sent in. No charge for EV.

## 2022-05-12 ENCOUNTER — Telehealth: Payer: Medicaid Other | Admitting: Nurse Practitioner

## 2022-05-12 DIAGNOSIS — J111 Influenza due to unidentified influenza virus with other respiratory manifestations: Secondary | ICD-10-CM

## 2022-05-12 MED ORDER — OSELTAMIVIR PHOSPHATE 75 MG PO CAPS: 75.0000 mg | ORAL_CAPSULE | Freq: Two times a day (BID) | ORAL | 0 refills | Status: AC

## 2022-05-12 NOTE — Progress Notes (Signed)
Virtual Visit Consent   [name], you are scheduled for a virtual visit with a [name] today. Just as with appointments in the office, your consent must be obtained to participate. Your consent will be active for this visit and any virtual visit you may have with one of our providers in the next 365 days. If you have a MyChart account, a copy of this consent can be sent to you electronically.  As this is a virtual visit, video technology does not allow for your provider to perform a traditional examination. This may limit your provider's ability to fully assess your condition. If your provider identifies any concerns that need to be evaluated in person or the need to arrange testing (such as labs, EKG, etc.), we will make arrangements to do so. Although advances in technology are sophisticated, we cannot ensure that it will always work on either your end or our end. If the connection with a video visit is poor, the visit may have to be switched to a telephone visit. With either a video or telephone visit, we are not always able to ensure that we have a secure connection.  By engaging in this virtual visit, you consent to the provision of healthcare and authorize for your insurance to be billed (if applicable) for the services provided during this visit. Depending on your insurance coverage, you may receive a charge related to this service.  I need to obtain your verbal consent now. Are you willing to proceed with your visit today? [name] has provided verbal consent on 05/12/2022 for a virtual visit (video or telephone). [name], FNP  Date: 05/12/2022 1:52 PM  Virtual Visit via Video Note   I, [name], connected with  Gabriella Montgomery  ([id number], 04-04-1986) on 05/12/22 at  2:00 PM EST by a video-enabled telemedicine application and verified that I am speaking with the correct person using two identifiers.  Location: Patient: Virtual Visit Location Patient:  Home Provider: Virtual Visit Location Provider: Home Office   I discussed the limitations of evaluation and management by telemedicine and the availability of in person appointments. The patient expressed understanding and agreed to proceed.    History of Present Illness: Gabriella Montgomery is a 37 y.o. who identifies as a female who was assigned female at birth, and is being seen today with complaints of sore throat, nasal congestion,a productive cough and headache. She did have a fever this morning   She is living with several people that are sick with the same symptoms and have all tested negative for COVID, awaiting flu results   Patient's home COVID test was negative   Her symptoms started last night   She has not had a flu vaccine this year   Denies a history of asthma or need for inhalers  Problems:  Patient Active Problem List   Diagnosis Date Noted   Vitamin D deficiency 10/12/2019   Seizure disorder ([place]) 08/18/2019   Migraine without aura and without status migrainosus, not intractable 08/18/2019   Class 3 severe obesity due to excess calories without serious comorbidity with body mass index (BMI) of 45.0 to 49.9 in adult [facility]) 08/18/2019   Normochromic anemia 08/18/2019   Pneumonia due to COVID-19 virus 07/19/2019   Seizure-like activity ([place]) 07/14/2019   History of pulmonary embolism 02/17/2016   Pulmonary embolism affecting pregnancy 01/14/2016    Allergies:  Allergies  Allergen Reactions   Azithromycin Anaphylaxis   Amoxicillin Hives  Has patient had a PCN reaction causing immediate rash, facial/tongue/throat swelling, SOB or lightheadedness with hypotension: No Has patient had a PCN reaction causing severe rash involving mucus membranes or skin necrosis: YEs Has patient had a PCN reaction that required hospitalization: No Has patient had a PCN reaction occurring within the last 10 years: No If all of the above answers are "NO", then may proceed with  Cephalosporin use.   Clindamycin Hives   Ibuprofen Hives   Tramadol Hives   Medications:  Current Outpatient Medications:    albuterol (VENTOLIN HFA) 108 (90 Base) MCG/ACT inhaler, Inhale 2 puffs into the lungs every 6 (six) hours as needed for wheezing or shortness of breath., Disp: 8 g, Rfl: 0   benzonatate (TESSALON) 100 MG capsule, Take 1 capsule (100 mg total) by mouth 3 (three) times daily as needed for cough., Disp: 30 capsule, Rfl: 0   cetirizine (ZYRTEC ALLERGY) 10 MG tablet, Take 1 tablet (10 mg total) by mouth daily., Disp: 90 tablet, Rfl: 1   cholecalciferol (VITAMIN D3) 25 MCG (1000 UNIT) tablet, Take 1,000 Units by mouth daily., Disp: , Rfl:    cyclobenzaprine (FLEXERIL) 10 MG tablet, Take 1 tablet (10 mg total) by mouth 3 (three) times daily as needed for muscle spasms., Disp: 10 tablet, Rfl: 0   ferrous sulfate 325 (65 FE) MG EC tablet, Take 325 mg by mouth daily., Disp: , Rfl:    fluticasone (FLONASE) 50 MCG/ACT nasal spray, Place 2 sprays into both nostrils daily., Disp: 16 g, Rfl: 6   levETIRAcetam (KEPPRA) 1000 MG tablet, Take 1 tablet (1,000 mg total) by mouth 2 (two) times daily., Disp: 60 tablet, Rfl: 11   promethazine-dextromethorphan (PROMETHAZINE-DM) 6.25-15 MG/5ML syrup, Take 5 mLs by mouth 4 (four) times daily as needed for cough., Disp: 118 mL, Rfl: 0   sulfamethoxazole-trimethoprim (BACTRIM DS) 800-160 MG tablet, Take 1 tablet by mouth 2 (two) times daily., Disp: 14 tablet, Rfl: 0  Observations/Objective: Patient is well-developed, well-nourished in no acute distress.  Resting comfortably  at home.  Head is normocephalic, atraumatic.  No labored breathing.  Speech is clear and coherent with logical content.  Patient is alert and oriented at baseline.    Assessment and Plan: 1. Flu Meds ordered this encounter  Medications   oseltamivir (TAMIFLU) 75 MG capsule    Sig: Take 1 capsule (75 mg total) by mouth 2 (two) times daily for 5 days.    Dispense:  10  capsule    Refill:  0    Take anti-viral with food      Follow Up Instructions: I discussed the assessment and treatment plan with the patient. The patient was provided an opportunity to ask questions and all were answered. The patient agreed with the plan and demonstrated an understanding of the instructions.  A copy of instructions were sent to the patient via MyChart unless otherwise noted below.    The patient was advised to call back or seek an in-person evaluation if the symptoms worsen or if the condition fails to improve as anticipated.  Time:  I spent 7 minutes with the patient via telehealth technology discussing the above problems/concerns.    [name], FNP

## 2022-05-13 ENCOUNTER — Other Ambulatory Visit: Payer: Self-pay | Admitting: Nurse Practitioner

## 2022-05-13 ENCOUNTER — Telehealth: Payer: Medicaid Other | Admitting: Nurse Practitioner

## 2022-05-13 ENCOUNTER — Encounter: Payer: Self-pay | Admitting: Nurse Practitioner

## 2022-05-13 DIAGNOSIS — R051 Acute cough: Secondary | ICD-10-CM

## 2022-05-13 MED ORDER — PROMETHAZINE-DM 6.25-15 MG/5ML PO SYRP: 5.0000 mL | ORAL_SOLUTION | Freq: Four times a day (QID) | ORAL | 0 refills | Status: AC | PRN

## 2022-05-13 NOTE — Progress Notes (Signed)
No shw for appointment

## 2022-05-14 NOTE — Progress Notes (Signed)
Duplicate visit

## 2022-05-15 ENCOUNTER — Encounter: Payer: Self-pay | Admitting: Nurse Practitioner

## 2022-05-15 ENCOUNTER — Telehealth: Payer: Medicaid Other

## 2022-05-15 NOTE — Progress Notes (Deleted)
No show for appointment, sent mychart message advising ED visit & follow up with PCP and Neurologist   Voicemail not set up Patient did not answer phone on multiple attempts

## 2022-05-20 ENCOUNTER — Telehealth: Payer: Medicaid Other | Admitting: Physician Assistant

## 2022-05-20 DIAGNOSIS — B379 Candidiasis, unspecified: Secondary | ICD-10-CM | POA: Diagnosis not present

## 2022-05-20 DIAGNOSIS — R3989 Other symptoms and signs involving the genitourinary system: Secondary | ICD-10-CM | POA: Diagnosis not present

## 2022-05-20 DIAGNOSIS — T3695XA Adverse effect of unspecified systemic antibiotic, initial encounter: Secondary | ICD-10-CM | POA: Diagnosis not present

## 2022-05-20 MED ORDER — SULFAMETHOXAZOLE-TRIMETHOPRIM 800-160 MG PO TABS: 1.0000 | ORAL_TABLET | Freq: Two times a day (BID) | ORAL | 0 refills | Status: DC

## 2022-05-20 MED ORDER — FLUCONAZOLE 150 MG PO TABS: 150.0000 mg | ORAL_TABLET | ORAL | 0 refills | Status: DC | PRN

## 2022-05-20 NOTE — Patient Instructions (Signed)
Gabriella Montgomery, thank you for joining [name], PA-C for today's virtual visit.  While this provider is not your primary care provider (PCP), if your PCP is located in our provider database this encounter information will be shared with them immediately following your visit.   A [facility] MyChart account gives you access to today's visit and all your visits, tests, and labs performed at [facility] " click here if you don't have a [facility] MyChart account or go to [email or web address]  Consent: (Patient) Gabriella Montgomery provided verbal consent for this virtual visit at the beginning of the encounter.  Current Medications:  Current Outpatient Medications:    fluconazole (DIFLUCAN) 150 MG tablet, Take 1 tablet (150 mg total) by mouth every 3 (three) days as needed., Disp: 2 tablet, Rfl: 0   sulfamethoxazole-trimethoprim (BACTRIM DS) 800-160 MG tablet, Take 1 tablet by mouth 2 (two) times daily., Disp: 14 tablet, Rfl: 0   albuterol (VENTOLIN HFA) 108 (90 Base) MCG/ACT inhaler, Inhale 2 puffs into the lungs every 6 (six) hours as needed for wheezing or shortness of breath., Disp: 8 g, Rfl: 0   benzonatate (TESSALON) 100 MG capsule, Take 1 capsule (100 mg total) by mouth 3 (three) times daily as needed for cough., Disp: 30 capsule, Rfl: 0   cetirizine (ZYRTEC ALLERGY) 10 MG tablet, Take 1 tablet (10 mg total) by mouth daily., Disp: 90 tablet, Rfl: 1   cholecalciferol (VITAMIN D3) 25 MCG (1000 UNIT) tablet, Take 1,000 Units by mouth daily., Disp: , Rfl:    cyclobenzaprine (FLEXERIL) 10 MG tablet, Take 1 tablet (10 mg total) by mouth 3 (three) times daily as needed for muscle spasms., Disp: 10 tablet, Rfl: 0   ferrous sulfate 325 (65 FE) MG EC tablet, Take 325 mg by mouth daily., Disp: , Rfl:    fluticasone (FLONASE) 50 MCG/ACT nasal spray, Place 2 sprays into both nostrils daily., Disp: 16 g, Rfl: 6   levETIRAcetam (KEPPRA) 1000 MG tablet, Take 1 tablet (1,000 mg  total) by mouth 2 (two) times daily., Disp: 60 tablet, Rfl: 11   promethazine-dextromethorphan (PROMETHAZINE-DM) 6.25-15 MG/5ML syrup, Take 5 mLs by mouth 4 (four) times daily as needed for cough., Disp: 118 mL, Rfl: 0   promethazine-dextromethorphan (PROMETHAZINE-DM) 6.25-15 MG/5ML syrup, Take 5 mLs by mouth 4 (four) times daily as needed for up to 7 days for cough., Disp: 118 mL, Rfl: 0   Medications ordered in this encounter:  Meds ordered this encounter  Medications   sulfamethoxazole-trimethoprim (BACTRIM DS) 800-160 MG tablet    Sig: Take 1 tablet by mouth 2 (two) times daily.    Dispense:  14 tablet    Refill:  0    Order Specific Question:   Supervising Provider    Answer:   Merrilee Jansky [[id number]]   fluconazole (DIFLUCAN) 150 MG tablet    Sig: Take 1 tablet (150 mg total) by mouth every 3 (three) days as needed.    Dispense:  2 tablet    Refill:  0    Order Specific Question:   Supervising Provider    Answer:   Merrilee Jansky [id number]     *If you need refills on other medications prior to your next appointment, please contact your pharmacy*  Follow-Up: Call back or seek an in-person evaluation if the symptoms worsen or if the condition fails to improve as anticipated.  [facility] [phone]  Other Instructions  Urinary Tract Infection, Adult  A urinary  tract infection (UTI) is an infection of any part of the urinary tract. The urinary tract includes the kidneys, ureters, bladder, and urethra. These organs make, store, and get rid of urine in the body. An upper UTI affects the ureters and kidneys. A lower UTI affects the bladder and urethra. What are the causes? Most urinary tract infections are caused by bacteria in your genital area around your urethra, where urine leaves your body. These bacteria grow and cause inflammation of your urinary tract. What increases the risk? You are more likely to develop this condition if: You have a urinary  catheter that stays in place. You are not able to control when you urinate or have a bowel movement (incontinence). You are female and you: Use a spermicide or diaphragm for birth control. Have low estrogen levels. Are pregnant. You have certain genes that increase your risk. You are sexually active. You take antibiotic medicines. You have a condition that causes your flow of urine to slow down, such as: An enlarged prostate, if you are female. Blockage in your urethra. A kidney stone. A nerve condition that affects your bladder control (neurogenic bladder). Not getting enough to drink, or not urinating often. You have certain medical conditions, such as: Diabetes. A weak disease-fighting system (immunesystem). Sickle cell disease. Gout. Spinal cord injury. What are the signs or symptoms? Symptoms of this condition include: Needing to urinate right away (urgency). Frequent urination. This may include small amounts of urine each time you urinate. Pain or burning with urination. Blood in the urine. Urine that smells bad or unusual. Trouble urinating. Cloudy urine. Vaginal discharge, if you are female. Pain in the abdomen or the lower back. You may also have: Vomiting or a decreased appetite. Confusion. Irritability or tiredness. A fever or chills. Diarrhea. The first symptom in older adults may be confusion. In some cases, they may not have any symptoms until the infection has worsened. How is this diagnosed? This condition is diagnosed based on your medical history and a physical exam. You may also have other tests, including: Urine tests. Blood tests. Tests for STIs (sexually transmitted infections). If you have had more than one UTI, a cystoscopy or imaging studies may be done to determine the cause of the infections. How is this treated? Treatment for this condition includes: Antibiotic medicine. Over-the-counter medicines to treat discomfort. Drinking enough water to  stay hydrated. If you have frequent infections or have other conditions such as a kidney stone, you may need to see a health care provider who specializes in the urinary tract (urologist). In rare cases, urinary tract infections can cause sepsis. Sepsis is a life-threatening condition that occurs when the body responds to an infection. Sepsis is treated in the hospital with IV antibiotics, fluids, and other medicines. Follow these instructions at home:  Medicines Take over-the-counter and prescription medicines only as told by your health care provider. If you were prescribed an antibiotic medicine, take it as told by your health care provider. Do not stop using the antibiotic even if you start to feel better. General instructions Make sure you: Empty your bladder often and completely. Do not hold urine for long periods of time. Empty your bladder after sex. Wipe from front to back after urinating or having a bowel movement if you are female. Use each tissue only one time when you wipe. Drink enough fluid to keep your urine pale yellow. Keep all follow-up visits. This is important. Contact a health care provider if: Your symptoms do  not get better after 1-2 days. Your symptoms go away and then return. Get help right away if: You have severe pain in your back or your lower abdomen. You have a fever or chills. You have nausea or vomiting. Summary A urinary tract infection (UTI) is an infection of any part of the urinary tract, which includes the kidneys, ureters, bladder, and urethra. Most urinary tract infections are caused by bacteria in your genital area. Treatment for this condition often includes antibiotic medicines. If you were prescribed an antibiotic medicine, take it as told by your health care provider. Do not stop using the antibiotic even if you start to feel better. Keep all follow-up visits. This is important. This information is not intended to replace advice given to you by  your health care provider. Make sure you discuss any questions you have with your health care provider. Document Revised: 11/24/2019 Document Reviewed: 11/24/2019 Elsevier Patient Education  [place].    If you have been instructed to have an in-person evaluation today at a local Urgent Care facility, please use the link below. It will take you to a list of all of our available [facility], including address, phone number and hours of operation. Please do not delay care.  [facility]  If you or a family member do not have a primary care provider, use the link below to schedule a visit and establish care. When you choose a [facility] primary care physician or advanced practice provider, you gain a long-term partner in health. Find a Primary Care Provider  Learn more about [facility]'s in-office and virtual care options: [facility]

## 2022-05-20 NOTE — Progress Notes (Signed)
Virtual Visit Consent   Gabriella Montgomery, you are scheduled for a virtual visit with a [facility] provider today. Just as with appointments in the office, your consent must be obtained to participate. Your consent will be active for this visit and any virtual visit you may have with one of our providers in the next 365 days. If you have a MyChart account, a copy of this consent can be sent to you electronically.  As this is a virtual visit, video technology does not allow for your provider to perform a traditional examination. This may limit your provider's ability to fully assess your condition. If your provider identifies any concerns that need to be evaluated in person or the need to arrange testing (such as labs, EKG, etc.), we will make arrangements to do so. Although advances in technology are sophisticated, we cannot ensure that it will always work on either your end or our end. If the connection with a video visit is poor, the visit may have to be switched to a telephone visit. With either a video or telephone visit, we are not always able to ensure that we have a secure connection.  By engaging in this virtual visit, you consent to the provision of healthcare and authorize for your insurance to be billed (if applicable) for the services provided during this visit. Depending on your insurance coverage, you may receive a charge related to this service.  I need to obtain your verbal consent now. Are you willing to proceed with your visit today? Gabriella Montgomery has provided verbal consent on 05/20/2022 for a virtual visit (video or telephone). [name], PA-C  Date: 05/20/2022 2:19 PM  Virtual Visit via Video Note   I, [name], connected with  Gabriella Montgomery  ([id number], [birth year]) on 05/20/22 at  2:15 PM EST by a video-enabled telemedicine application and verified that I am speaking with the correct person using two identifiers.  Location: Patient: Virtual Visit  Location Patient: Home Provider: Virtual Visit Location Provider: Home Office   I discussed the limitations of evaluation and management by telemedicine and the availability of in person appointments. The patient expressed understanding and agreed to proceed.    History of Present Illness: Gabriella Montgomery is a 37 y.o. who identifies as a female who was assigned female at birth, and is being seen today for possible UTI.  HPI: Urinary Tract Infection  This is a new problem. The current episode started 1 to 4 weeks ago (off and on for 2 weeks). The problem has been gradually worsening. The quality of the pain is described as aching and burning. The pain is mild. The maximum temperature recorded prior to her arrival was 100 - 100.9 F. The fever has been present for Less than 1 day. Associated symptoms include frequency, hesitancy and urgency. Pertinent negatives include no chills, flank pain, hematuria, nausea or vomiting. She has tried acetaminophen and increased fluids (cranberry juice) for the symptoms. The treatment provided no relief. There is no history of recurrent UTIs.     Problems:  Patient Active Problem List   Diagnosis Date Noted   Vitamin D deficiency 10/12/2019   Seizure disorder ([facility]) 08/18/2019   Migraine without aura and without status migrainosus, not intractable 08/18/2019   Class 3 severe obesity due to excess calories without serious comorbidity with body mass index (BMI) of 45.0 to 49.9 in adult [facility]) 08/18/2019   Normochromic anemia 08/18/2019   Pneumonia due to COVID-19 virus 07/19/2019  Seizure-like activity ([place]) 07/14/2019   History of pulmonary embolism 02/17/2016   Pulmonary embolism affecting pregnancy 01/14/2016    Allergies:  Allergies  Allergen Reactions   Azithromycin Anaphylaxis   Amoxicillin Hives    Has patient had a PCN reaction causing immediate rash, facial/tongue/throat swelling, SOB or lightheadedness with hypotension: No Has patient had a PCN  reaction causing severe rash involving mucus membranes or skin necrosis: YEs Has patient had a PCN reaction that required hospitalization: No Has patient had a PCN reaction occurring within the last 10 years: No If all of the above answers are "NO", then may proceed with Cephalosporin use.   Clindamycin Hives   Ibuprofen Hives   Tramadol Hives   Medications:  Current Outpatient Medications:    fluconazole (DIFLUCAN) 150 MG tablet, Take 1 tablet (150 mg total) by mouth every 3 (three) days as needed., Disp: 2 tablet, Rfl: 0   sulfamethoxazole-trimethoprim (BACTRIM DS) 800-160 MG tablet, Take 1 tablet by mouth 2 (two) times daily., Disp: 14 tablet, Rfl: 0   albuterol (VENTOLIN HFA) 108 (90 Base) MCG/ACT inhaler, Inhale 2 puffs into the lungs every 6 (six) hours as needed for wheezing or shortness of breath., Disp: 8 g, Rfl: 0   benzonatate (TESSALON) 100 MG capsule, Take 1 capsule (100 mg total) by mouth 3 (three) times daily as needed for cough., Disp: 30 capsule, Rfl: 0   cetirizine (ZYRTEC ALLERGY) 10 MG tablet, Take 1 tablet (10 mg total) by mouth daily., Disp: 90 tablet, Rfl: 1   cholecalciferol (VITAMIN D3) 25 MCG (1000 UNIT) tablet, Take 1,000 Units by mouth daily., Disp: , Rfl:    cyclobenzaprine (FLEXERIL) 10 MG tablet, Take 1 tablet (10 mg total) by mouth 3 (three) times daily as needed for muscle spasms., Disp: 10 tablet, Rfl: 0   ferrous sulfate 325 (65 FE) MG EC tablet, Take 325 mg by mouth daily., Disp: , Rfl:    fluticasone (FLONASE) 50 MCG/ACT nasal spray, Place 2 sprays into both nostrils daily., Disp: 16 g, Rfl: 6   levETIRAcetam (KEPPRA) 1000 MG tablet, Take 1 tablet (1,000 mg total) by mouth 2 (two) times daily., Disp: 60 tablet, Rfl: 11   promethazine-dextromethorphan (PROMETHAZINE-DM) 6.25-15 MG/5ML syrup, Take 5 mLs by mouth 4 (four) times daily as needed for cough., Disp: 118 mL, Rfl: 0   promethazine-dextromethorphan (PROMETHAZINE-DM) 6.25-15 MG/5ML syrup, Take 5 mLs by  mouth 4 (four) times daily as needed for up to 7 days for cough., Disp: 118 mL, Rfl: 0  Observations/Objective: Patient is well-developed, well-nourished in no acute distress.  Resting comfortably at home.  Head is normocephalic, atraumatic.  No labored breathing.  Speech is clear and coherent with logical content.  Patient is alert and oriented at baseline.    Assessment and Plan: 1. Suspected UTI - sulfamethoxazole-trimethoprim (BACTRIM DS) 800-160 MG tablet; Take 1 tablet by mouth 2 (two) times daily.  Dispense: 14 tablet; Refill: 0  2. Antibiotic-induced yeast infection - fluconazole (DIFLUCAN) 150 MG tablet; Take 1 tablet (150 mg total) by mouth every 3 (three) days as needed.  Dispense: 2 tablet; Refill: 0  - Worsening symptoms.  - Will treat empirically with Bactrim - May use AZO for bladder spasms - Continue to push fluids.  - Diflucan given as prophylaxis as patient tends to get vaginal yeast infections with antibiotic use - Seek in person evaluation for urine culture if symptoms do not improve or if they worsen.    Follow Up Instructions: I discussed the assessment and  treatment plan with the patient. The patient was provided an opportunity to ask questions and all were answered. The patient agreed with the plan and demonstrated an understanding of the instructions.  A copy of instructions were sent to the patient via MyChart unless otherwise noted below.    The patient was advised to call back or seek an in-person evaluation if the symptoms worsen or if the condition fails to improve as anticipated.  Time:  I spent 8 minutes with the patient via telehealth technology discussing the above problems/concerns.    Mar Daring, PA-C

## 2022-05-29 ENCOUNTER — Telehealth: Payer: Medicaid Other | Admitting: Physician Assistant

## 2022-05-29 DIAGNOSIS — B9689 Other specified bacterial agents as the cause of diseases classified elsewhere: Secondary | ICD-10-CM

## 2022-05-29 DIAGNOSIS — J069 Acute upper respiratory infection, unspecified: Secondary | ICD-10-CM

## 2022-05-29 MED ORDER — PREDNISONE 20 MG PO TABS: 40.0000 mg | ORAL_TABLET | Freq: Every day | ORAL | 0 refills | Status: DC

## 2022-05-29 MED ORDER — PROMETHAZINE-DM 6.25-15 MG/5ML PO SYRP: 5.0000 mL | ORAL_SOLUTION | Freq: Four times a day (QID) | ORAL | 0 refills | Status: DC | PRN

## 2022-05-29 MED ORDER — DOXYCYCLINE HYCLATE 100 MG PO TABS: 100.0000 mg | ORAL_TABLET | Freq: Two times a day (BID) | ORAL | 0 refills | Status: DC

## 2022-05-29 NOTE — Patient Instructions (Signed)
Gabriella Montgomery, thank you for joining Mar Daring, PA-C for today's virtual visit.  While this provider is not your primary care provider (PCP), if your PCP is located in our provider database this encounter information will be shared with them immediately following your visit.   [facility] account gives you access to today's visit and all your visits, tests, and labs performed at [facility] " click here if you don't have a [facility] account or go to [email or web address]  Consent: (Patient) Gabriella Montgomery provided verbal consent for this virtual visit at the beginning of the encounter.  Current Medications:  Current Outpatient Medications:    doxycycline (VIBRA-TABS) 100 MG tablet, Take 1 tablet (100 mg total) by mouth 2 (two) times daily., Disp: 20 tablet, Rfl: 0   predniSONE (DELTASONE) 20 MG tablet, Take 2 tablets (40 mg total) by mouth daily with breakfast., Disp: 10 tablet, Rfl: 0   promethazine-dextromethorphan (PROMETHAZINE-DM) 6.25-15 MG/5ML syrup, Take 5 mLs by mouth 4 (four) times daily as needed for cough., Disp: 118 mL, Rfl: 0   albuterol (VENTOLIN HFA) 108 (90 Base) MCG/ACT inhaler, Inhale 2 puffs into the lungs every 6 (six) hours as needed for wheezing or shortness of breath., Disp: 8 g, Rfl: 0   benzonatate (TESSALON) 100 MG capsule, Take 1 capsule (100 mg total) by mouth 3 (three) times daily as needed for cough., Disp: 30 capsule, Rfl: 0   cetirizine (ZYRTEC ALLERGY) 10 MG tablet, Take 1 tablet (10 mg total) by mouth daily., Disp: 90 tablet, Rfl: 1   cholecalciferol (VITAMIN D3) 25 MCG (1000 UNIT) tablet, Take 1,000 Units by mouth daily., Disp: , Rfl:    cyclobenzaprine (FLEXERIL) 10 MG tablet, Take 1 tablet (10 mg total) by mouth 3 (three) times daily as needed for muscle spasms., Disp: 10 tablet, Rfl: 0   ferrous sulfate 325 (65 FE) MG EC tablet, Take 325 mg by mouth daily., Disp: , Rfl:    fluconazole (DIFLUCAN) 150 MG  tablet, Take 1 tablet (150 mg total) by mouth every 3 (three) days as needed., Disp: 2 tablet, Rfl: 0   fluticasone (FLONASE) 50 MCG/ACT nasal spray, Place 2 sprays into both nostrils daily., Disp: 16 g, Rfl: 6   levETIRAcetam (KEPPRA) 1000 MG tablet, Take 1 tablet (1,000 mg total) by mouth 2 (two) times daily., Disp: 60 tablet, Rfl: 11   Medications ordered in this encounter:  Meds ordered this encounter  Medications   doxycycline (VIBRA-TABS) 100 MG tablet    Sig: Take 1 tablet (100 mg total) by mouth 2 (two) times daily.    Dispense:  20 tablet    Refill:  0    Order Specific Question:   Supervising Provider    Answer:   Chase Picket [[id number]]   predniSONE (DELTASONE) 20 MG tablet    Sig: Take 2 tablets (40 mg total) by mouth daily with breakfast.    Dispense:  10 tablet    Refill:  0    Order Specific Question:   Supervising Provider    Answer:   Chase Picket [[id number]]   promethazine-dextromethorphan (PROMETHAZINE-DM) 6.25-15 MG/5ML syrup    Sig: Take 5 mLs by mouth 4 (four) times daily as needed for cough.    Dispense:  118 mL    Refill:  0    Order Specific Question:   Supervising Provider    Answer:   Chase Picket [id number]     *If you need refills on  other medications prior to your next appointment, please contact your pharmacy*  Follow-Up: Call back or seek an in-person evaluation if the symptoms worsen or if the condition fails to improve as anticipated.  [facility] [phone]  Other Instructions  Upper Respiratory Infection, Adult An upper respiratory infection (URI) is a common viral infection of the nose, throat, and upper air passages that lead to the lungs. The most common type of URI is the common cold. URIs usually get better on their own, without medical treatment. What are the causes? A URI is caused by a virus. You may catch a virus by: Breathing in droplets from an infected person's cough or sneeze. Touching something that  has been exposed to the virus (is contaminated) and then touching your mouth, nose, or eyes. What increases the risk? You are more likely to get a URI if: You are very young or very old. You have close contact with others, such as at work, school, or a health care facility. You smoke. You have long-term (chronic) heart or lung disease. You have a weakened disease-fighting system (immune system). You have nasal allergies or asthma. You are experiencing a lot of stress. You have poor nutrition. What are the signs or symptoms? A URI usually involves some of the following symptoms: Runny or stuffy (congested) nose. Cough. Sneezing. Sore throat. Headache. Fatigue. Fever. Loss of appetite. Pain in your forehead, behind your eyes, and over your cheekbones (sinus pain). Muscle aches. Redness or irritation of the eyes. Pressure in the ears or face. How is this diagnosed? This condition may be diagnosed based on your medical history and symptoms, and a physical exam. Your health care provider may use a swab to take a mucus sample from your nose (nasal swab). This sample can be tested to determine what virus is causing the illness. How is this treated? URIs usually get better on their own within 7-10 days. Medicines cannot cure URIs, but your health care provider may recommend certain medicines to help relieve symptoms, such as: Over-the-counter cold medicines. Cough suppressants. Coughing is a type of defense against infection that helps to clear the respiratory system, so take these medicines only as recommended by your health care provider. Fever-reducing medicines. Follow these instructions at home: Activity Rest as needed. If you have a fever, stay home from work or school until your fever is gone or until your health care provider says your URI cannot spread to other people (is no longer contagious). Your health care provider may have you wear a face mask to prevent your infection from  spreading. Relieving symptoms Gargle with a mixture of salt and water 3-4 times a day or as needed. To make salt water, completely dissolve -1 tsp (3-6 g) of salt in 1 cup (237 mL) of warm water. Use a cool-mist humidifier to add moisture to the air. This can help you breathe more easily. Eating and drinking  Drink enough fluid to keep your urine pale yellow. Eat soups and other clear broths. General instructions  Take over-the-counter and prescription medicines only as told by your health care provider. These include cold medicines, fever reducers, and cough suppressants. Do not use any products that contain nicotine or tobacco. These products include cigarettes, chewing tobacco, and vaping devices, such as e-cigarettes. If you need help quitting, ask your health care provider. Stay away from secondhand smoke. Stay up to date on all immunizations, including the yearly (annual) flu vaccine. Keep all follow-up visits. This is important.  How to prevent the spread of infection to others URIs can be contagious. To prevent the infection from spreading: Wash your hands with soap and water for at least 20 seconds. If soap and water are not available, use hand sanitizer. Avoid touching your mouth, face, eyes, or nose. Cough or sneeze into a tissue or your sleeve or elbow instead of into your hand or into the air.  Contact a health care provider if: You are getting worse instead of better. You have a fever or chills. Your mucus is brown or red. You have yellow or brown discharge coming from your nose. You have pain in your face, especially when you bend forward. You have swollen neck glands. You have pain while swallowing. You have white areas in the back of your throat. Get help right away if: You have shortness of breath that gets worse. You have severe or persistent: Headache. Ear pain. Sinus pain. Chest pain. You have chronic lung disease along with any of the following: Making  high-pitched whistling sounds when you breathe, most often when you breathe out (wheezing). Prolonged cough (more than 14 days). Coughing up blood. A change in your usual mucus. You have a stiff neck. You have changes in your: Vision. Hearing. Thinking. Mood. These symptoms may be an emergency. Get help right away. Call 911. Do not wait to see if the symptoms will go away. Do not drive yourself to the hospital. Summary An upper respiratory infection (URI) is a common infection of the nose, throat, and upper air passages that lead to the lungs. A URI is caused by a virus. URIs usually get better on their own within 7-10 days. Medicines cannot cure URIs, but your health care provider may recommend certain medicines to help relieve symptoms. This information is not intended to replace advice given to you by your health care provider. Make sure you discuss any questions you have with your health care provider. Document Revised: 11/13/2020 Document Reviewed: 11/13/2020 Elsevier Patient Education  [place].    If you have been instructed to have an in-person evaluation today at a local Urgent Care facility, please use the link below. It will take you to a list of all of our available [facility], including address, phone number and hours of operation. Please do not delay care.  [facility]  If you or a family member do not have a primary care provider, use the link below to schedule a visit and establish care. When you choose a [facility] primary care physician or advanced practice provider, you gain a long-term partner in health. Find a Primary Care Provider  Learn more about 's in-office and virtual care options: [facility]

## 2022-05-29 NOTE — Progress Notes (Signed)
Virtual Visit Consent   Gabriella Montgomery, you are scheduled for a virtual visit with a [facility] provider today. Just as with appointments in the office, your consent must be obtained to participate. Your consent will be active for this visit and any virtual visit you may have with one of our providers in the next 365 days. If you have a MyChart account, a copy of this consent can be sent to you electronically.  As this is a virtual visit, video technology does not allow for your provider to perform a traditional examination. This may limit your provider's ability to fully assess your condition. If your provider identifies any concerns that need to be evaluated in person or the need to arrange testing (such as labs, EKG, etc.), we will make arrangements to do so. Although advances in technology are sophisticated, we cannot ensure that it will always work on either your end or our end. If the connection with a video visit is poor, the visit may have to be switched to a telephone visit. With either a video or telephone visit, we are not always able to ensure that we have a secure connection.  By engaging in this virtual visit, you consent to the provision of healthcare and authorize for your insurance to be billed (if applicable) for the services provided during this visit. Depending on your insurance coverage, you may receive a charge related to this service.  I need to obtain your verbal consent now. Are you willing to proceed with your visit today? Gabriella Montgomery has provided verbal consent on 05/29/2022 for a virtual visit (video or telephone). Mar Daring, PA-C  Date: 05/29/2022 12:05 PM  Virtual Visit via Video Note   I, Mar Daring, connected with  Gabriella Montgomery  ([id number], [birth year]) on 05/29/22 at 12:00 PM EST by a video-enabled telemedicine application and verified that I am speaking with the correct person using two identifiers.  Location: Patient: Virtual Visit  Location Patient: Home Provider: Virtual Visit Location Provider: Home Office   I discussed the limitations of evaluation and management by telemedicine and the availability of in person appointments. The patient expressed understanding and agreed to proceed.    History of Present Illness: Gabriella Montgomery is a 37 y.o. who identifies as a female who was assigned female at birth, and is being seen today for continued URI symptoms following Influenza. Did virtual visit on 05/12/22 and treated with Tamiflu. Completed flu treatment on 05/22/22, did not start when prescribed. Reports was feeling better after Tamiflu.  Reports went to ER on Tuesday, 05/26/22, and they told her to complete tamiflu. Reports symptoms started back this morning and have progressively worsened. Having chest tightness, ear pain, nasal congestion, cough.   Patient mentions above timeline that does not seem to line up accurately, and I am unable to access any other UC or ER visits. She does reside in [place] is most likely seeking care with a facility that does not have Epic so we cannot rely on their notes or dates seen.    Problems:  Patient Active Problem List   Diagnosis Date Noted   Vitamin D deficiency 10/12/2019   Seizure disorder ([place]) 08/18/2019   Migraine without aura and without status migrainosus, not intractable 08/18/2019   Class 3 severe obesity due to excess calories without serious comorbidity with body mass index (BMI) of 45.0 to 49.9 in adult [facility]) 08/18/2019   Normochromic anemia 08/18/2019   Pneumonia due to COVID-19 virus  07/19/2019   Seizure-like activity ([place]) 07/14/2019   History of pulmonary embolism 02/17/2016   Pulmonary embolism affecting pregnancy 01/14/2016    Allergies:  Allergies  Allergen Reactions   Azithromycin Anaphylaxis   Amoxicillin Hives    Has patient had a PCN reaction causing immediate rash, facial/tongue/throat swelling, SOB or lightheadedness with hypotension: No Has  patient had a PCN reaction causing severe rash involving mucus membranes or skin necrosis: YEs Has patient had a PCN reaction that required hospitalization: No Has patient had a PCN reaction occurring within the last 10 years: No If all of the above answers are "NO", then may proceed with Cephalosporin use.   Clindamycin Hives   Ibuprofen Hives   Tramadol Hives   Medications:  Current Outpatient Medications:    doxycycline (VIBRA-TABS) 100 MG tablet, Take 1 tablet (100 mg total) by mouth 2 (two) times daily., Disp: 20 tablet, Rfl: 0   predniSONE (DELTASONE) 20 MG tablet, Take 2 tablets (40 mg total) by mouth daily with breakfast., Disp: 10 tablet, Rfl: 0   promethazine-dextromethorphan (PROMETHAZINE-DM) 6.25-15 MG/5ML syrup, Take 5 mLs by mouth 4 (four) times daily as needed for cough., Disp: 118 mL, Rfl: 0   albuterol (VENTOLIN HFA) 108 (90 Base) MCG/ACT inhaler, Inhale 2 puffs into the lungs every 6 (six) hours as needed for wheezing or shortness of breath., Disp: 8 g, Rfl: 0   benzonatate (TESSALON) 100 MG capsule, Take 1 capsule (100 mg total) by mouth 3 (three) times daily as needed for cough., Disp: 30 capsule, Rfl: 0   cetirizine (ZYRTEC ALLERGY) 10 MG tablet, Take 1 tablet (10 mg total) by mouth daily., Disp: 90 tablet, Rfl: 1   cholecalciferol (VITAMIN D3) 25 MCG (1000 UNIT) tablet, Take 1,000 Units by mouth daily., Disp: , Rfl:    cyclobenzaprine (FLEXERIL) 10 MG tablet, Take 1 tablet (10 mg total) by mouth 3 (three) times daily as needed for muscle spasms., Disp: 10 tablet, Rfl: 0   ferrous sulfate 325 (65 FE) MG EC tablet, Take 325 mg by mouth daily., Disp: , Rfl:    fluconazole (DIFLUCAN) 150 MG tablet, Take 1 tablet (150 mg total) by mouth every 3 (three) days as needed., Disp: 2 tablet, Rfl: 0   fluticasone (FLONASE) 50 MCG/ACT nasal spray, Place 2 sprays into both nostrils daily., Disp: 16 g, Rfl: 6   levETIRAcetam (KEPPRA) 1000 MG tablet, Take 1 tablet (1,000 mg total) by mouth  2 (two) times daily., Disp: 60 tablet, Rfl: 11  Observations/Objective: Patient is well-developed, well-nourished in no acute distress.  Resting comfortably at home.  Head is normocephalic, atraumatic.  No labored breathing.  Speech is clear and coherent with logical content.  Patient is alert and oriented at baseline.    Assessment and Plan: 1. Bacterial upper respiratory infection - doxycycline (VIBRA-TABS) 100 MG tablet; Take 1 tablet (100 mg total) by mouth 2 (two) times daily.  Dispense: 20 tablet; Refill: 0 - predniSONE (DELTASONE) 20 MG tablet; Take 2 tablets (40 mg total) by mouth daily with breakfast.  Dispense: 10 tablet; Refill: 0 - promethazine-dextromethorphan (PROMETHAZINE-DM) 6.25-15 MG/5ML syrup; Take 5 mLs by mouth 4 (four) times daily as needed for cough.  Dispense: 118 mL; Refill: 0  - Secondary infection following influenza suspected - Doxycycline prescribed - Promethazine DM  and Prednisone for cough - Use albuterol as prescribed - Push fluids - Conversation had about frequent use of virtual urgent care and that she should establish with a PCP in [place] since she has  been residing there. Advised she could still access our services and this was not losing [facility], but would offer better management for her since she has had multiple virtual appointments that have really escalated since Aug 2023. She voiced understanding - Seek in person evaluation if symptoms continue to worsen or fail to improve  Follow Up Instructions: I discussed the assessment and treatment plan with the patient. The patient was provided an opportunity to ask questions and all were answered. The patient agreed with the plan and demonstrated an understanding of the instructions.  A copy of instructions were sent to the patient via MyChart unless otherwise noted below.    The patient was advised to call back or seek an in-person evaluation if the symptoms worsen or if the condition fails to improve  as anticipated.  Time:  I spent 11 minutes with the patient via telehealth technology discussing the above problems/concerns.    Mar Daring, PA-C

## 2022-05-30 ENCOUNTER — Telehealth: Payer: Self-pay | Admitting: Nurse Practitioner

## 2022-09-18 ENCOUNTER — Telehealth: Payer: Medicaid Other | Admitting: Nurse Practitioner

## 2022-09-18 DIAGNOSIS — R519 Headache, unspecified: Secondary | ICD-10-CM

## 2022-09-18 DIAGNOSIS — G8929 Other chronic pain: Secondary | ICD-10-CM | POA: Diagnosis not present

## 2022-09-18 NOTE — Progress Notes (Signed)
Virtual Visit Consent   [name], you are scheduled for a virtual visit with a [name] provider today. Just as with appointments in the office, your consent must be obtained to participate. Your consent will be active for this visit and any virtual visit you may have with one of our providers in the next 365 days. If you have a MyChart account, a copy of this consent can be sent to you electronically.  As this is a virtual visit, video technology does not allow for your provider to perform a traditional examination. This may limit your provider's ability to fully assess your condition. If your provider identifies any concerns that need to be evaluated in person or the need to arrange testing (such as labs, EKG, etc.), we will make arrangements to do so. Although advances in technology are sophisticated, we cannot ensure that it will always work on either your end or our end. If the connection with a video visit is poor, the visit may have to be switched to a telephone visit. With either a video or telephone visit, we are not always able to ensure that we have a secure connection.  By engaging in this virtual visit, you consent to the provision of healthcare and authorize for your insurance to be billed (if applicable) for the services provided during this visit. Depending on your insurance coverage, you may receive a charge related to this service.  I need to obtain your verbal consent now. Are you willing to proceed with your visit today? Gabriella Montgomery has provided verbal consent on 09/18/2022 for a virtual visit (video or telephone). Viviano Simas, FNP  Date: 09/18/2022 5:50 PM  Virtual Visit via Video Note   I, Viviano Simas, connected with  Gabriella Montgomery  ([id number], 17-Jul-1985) on 09/18/22 at  6:00 PM EDT by a video-enabled telemedicine application and verified that I am speaking with the correct person using two identifiers.  Location: Patient: Virtual Visit Location Patient:  Home Provider: Virtual Visit Location Provider: Home Office   I discussed the limitations of evaluation and management by telemedicine and the availability of in person appointments. The patient expressed understanding and agreed to proceed.    History of Present Illness: Gabriella Montgomery is a 37 y.o. who identifies as a female who was assigned female at birth, and is being seen today for a headache and pain in wrists and ankle   Her headaches have changed in the past month  She has not talked to her neurologist but has been to a UC and was given tylenol for relief of HA The tylenol does not relieve the pain   She is living in [place] for school but her home address and PCP are in [place]   She goes to [employer] currently   Her headaches are occurring mostly at night, she is able to fall asleep  The headache is all over her head, feels throbbing  Headaches are intermittent/ not every day   Not centralized or localized to one area or side  She will get dizzy at times  Denies nausea or vomiting  She will have some changes in vision at times  She has a history of migraines  She has a history of seizures and has had two seizure episodes and has found a local neurologist in [place] for her neurological needs.   Her wrist and ankle pains have started over the past week  She notices that the ankle and wrist pain comes  on when she has back pain  Denies swelling in joints  Denies any changes in movement or mobility    Problems:  Patient Active Problem List   Diagnosis Date Noted   Vitamin D deficiency 10/12/2019   Seizure disorder (HCC) 08/18/2019   Migraine without aura and without status migrainosus, not intractable 08/18/2019   Class 3 severe obesity due to excess calories without serious comorbidity with body mass index (BMI) of 45.0 to 49.9 in adult [facility]) 08/18/2019   Normochromic anemia 08/18/2019   Pneumonia due to COVID-19 virus 07/19/2019   Seizure-like  activity (HCC) 07/14/2019   History of pulmonary embolism 02/17/2016   Pulmonary embolism affecting pregnancy 01/14/2016    Allergies:  Allergies  Allergen Reactions   Azithromycin Anaphylaxis   Amoxicillin Hives    Has patient had a PCN reaction causing immediate rash, facial/tongue/throat swelling, SOB or lightheadedness with hypotension: No Has patient had a PCN reaction causing severe rash involving mucus membranes or skin necrosis: YEs Has patient had a PCN reaction that required hospitalization: No Has patient had a PCN reaction occurring within the last 10 years: No If all of the above answers are "NO", then may proceed with Cephalosporin use.   Clindamycin Hives   Ibuprofen Hives   Tramadol Hives   Medications:  Current Outpatient Medications:    albuterol (VENTOLIN HFA) 108 (90 Base) MCG/ACT inhaler, Inhale 2 puffs into the lungs every 6 (six) hours as needed for wheezing or shortness of breath., Disp: 8 g, Rfl: 0   benzonatate (TESSALON) 100 MG capsule, Take 1 capsule (100 mg total) by mouth 3 (three) times daily as needed for cough., Disp: 30 capsule, Rfl: 0   cetirizine (ZYRTEC ALLERGY) 10 MG tablet, Take 1 tablet (10 mg total) by mouth daily., Disp: 90 tablet, Rfl: 1   cholecalciferol (VITAMIN D3) 25 MCG (1000 UNIT) tablet, Take 1,000 Units by mouth daily., Disp: , Rfl:    cyclobenzaprine (FLEXERIL) 10 MG tablet, Take 1 tablet (10 mg total) by mouth 3 (three) times daily as needed for muscle spasms., Disp: 10 tablet, Rfl: 0   doxycycline (VIBRA-TABS) 100 MG tablet, Take 1 tablet (100 mg total) by mouth 2 (two) times daily., Disp: 20 tablet, Rfl: 0   ferrous sulfate 325 (65 FE) MG EC tablet, Take 325 mg by mouth daily., Disp: , Rfl:    fluconazole (DIFLUCAN) 150 MG tablet, Take 1 tablet (150 mg total) by mouth every 3 (three) days as needed., Disp: 2 tablet, Rfl: 0   fluticasone (FLONASE) 50 MCG/ACT nasal spray, Place 2 sprays into both nostrils daily., Disp: 16 g, Rfl: 6    levETIRAcetam (KEPPRA) 1000 MG tablet, Take 1 tablet (1,000 mg total) by mouth 2 (two) times daily., Disp: 60 tablet, Rfl: 11   predniSONE (DELTASONE) 20 MG tablet, Take 2 tablets (40 mg total) by mouth daily with breakfast., Disp: 10 tablet, Rfl: 0   promethazine-dextromethorphan (PROMETHAZINE-DM) 6.25-15 MG/5ML syrup, Take 5 mLs by mouth 4 (four) times daily as needed for cough., Disp: 118 mL, Rfl: 0  Observations/Objective: Patient is well-developed, well-nourished in no acute distress.  Resting comfortably  at home.  Head is normocephalic, atraumatic.  No labored breathing.  Speech is clear and coherent with logical content.  Patient is alert and oriented at baseline.    Assessment and Plan: 1. Chronic nonintractable headache, unspecified headache type  Due to history of seizures and changes in headache patterns in the past month advised follow up with neurology.   Discussed  with patient that due to her limitation in medications, allergies and medical history we cannot advise anything else for pain at this time. If pain is unmanaged she will need to be seen in person for evaluation of the need for stronger medications     Patient is agreeable to plan  Will call neurology for follow up and seek UC/ED as needed for ongoing/new or worsening symptoms   Follow Up Instructions: I discussed the assessment and treatment plan with the patient. The patient was provided an opportunity to ask questions and all were answered. The patient agreed with the plan and demonstrated an understanding of the instructions.  A copy of instructions were sent to the patient via MyChart unless otherwise noted below.    The patient was advised to call back or seek an in-person evaluation if the symptoms worsen or if the condition fails to improve as anticipated.  Time:  I spent 15 minutes with the patient via telehealth technology discussing the above problems/concerns.    Viviano Simas, FNP

## 2022-09-30 ENCOUNTER — Telehealth: Payer: Medicaid Other | Admitting: Nurse Practitioner

## 2022-09-30 DIAGNOSIS — R300 Dysuria: Secondary | ICD-10-CM | POA: Diagnosis not present

## 2022-09-30 NOTE — Progress Notes (Signed)
Virtual Visit Consent   Gabriella Montgomery, you are scheduled for a virtual visit with a [facility] provider today. Just as with appointments in the office, your consent must be obtained to participate. Your consent will be active for this visit and any virtual visit you may have with one of our providers in the next 365 days. If you have a MyChart account, a copy of this consent can be sent to you electronically.  As this is a virtual visit, video technology does not allow for your provider to perform a traditional examination. This may limit your provider's ability to fully assess your condition. If your provider identifies any concerns that need to be evaluated in person or the need to arrange testing (such as labs, EKG, etc.), we will make arrangements to do so. Although advances in technology are sophisticated, we cannot ensure that it will always work on either your end or our end. If the connection with a video visit is poor, the visit may have to be switched to a telephone visit. With either a video or telephone visit, we are not always able to ensure that we have a secure connection.  By engaging in this virtual visit, you consent to the provision of healthcare and authorize for your insurance to be billed (if applicable) for the services provided during this visit. Depending on your insurance coverage, you may receive a charge related to this service.  I need to obtain your verbal consent now. Are you willing to proceed with your visit today? Gabriella Montgomery has provided verbal consent on 09/30/2022 for a virtual visit (video or telephone). Viviano Simas, FNP  Date: 09/30/2022 7:42 AM  Virtual Visit via Video Note   I, Viviano Simas, connected with  Gabriella Montgomery  ([id number], [birth year]) on 09/30/22 at  7:45 AM EDT by a video-enabled telemedicine application and verified that I am speaking with the correct person using two identifiers.  Location: Patient: Virtual Visit Location Patient:  Home Provider: Virtual Visit Location Provider: Home Office   I discussed the limitations of evaluation and management by telemedicine and the availability of in person appointments. The patient expressed understanding and agreed to proceed.    History of Present Illness: Gabriella Montgomery is a 37 y.o. who identifies as a female who was assigned female at birth, and is being seen today for symptoms pain with urinating.   When she urinated she has abdominal pain only The pain occurs when she uses the bathroom and she feels shooting pain while urinating  The pain is on the left side and shoots to her back The pain is relieved once she has finished urinating  She has taken tylenol for relief    No fever no urinary urgency or frequency at this point   Denies bleeding, nausea or vomiting   Problems:  Patient Active Problem List   Diagnosis Date Noted   Vitamin D deficiency 10/12/2019   Seizure disorder (HCC) 08/18/2019   Migraine without aura and without status migrainosus, not intractable 08/18/2019   Class 3 severe obesity due to excess calories without serious comorbidity with body mass index (BMI) of 45.0 to 49.9 in adult [facility]) 08/18/2019   Normochromic anemia 08/18/2019   Pneumonia due to COVID-19 virus 07/19/2019   Seizure-like activity (HCC) 07/14/2019   History of pulmonary embolism 02/17/2016   Pulmonary embolism affecting pregnancy 01/14/2016    Allergies:  Allergies  Allergen Reactions   Azithromycin Anaphylaxis   Amoxicillin Hives  Has patient had a PCN reaction causing immediate rash, facial/tongue/throat swelling, SOB or lightheadedness with hypotension: No Has patient had a PCN reaction causing severe rash involving mucus membranes or skin necrosis: YEs Has patient had a PCN reaction that required hospitalization: No Has patient had a PCN reaction occurring within the last 10 years: No If all of the above answers are "NO", then may proceed with Cephalosporin use.    Clindamycin Hives   Ibuprofen Hives   Tramadol Hives   Medications:  Current Outpatient Medications:    albuterol (VENTOLIN HFA) 108 (90 Base) MCG/ACT inhaler, Inhale 2 puffs into the lungs every 6 (six) hours as needed for wheezing or shortness of breath., Disp: 8 g, Rfl: 0   benzonatate (TESSALON) 100 MG capsule, Take 1 capsule (100 mg total) by mouth 3 (three) times daily as needed for cough., Disp: 30 capsule, Rfl: 0   cetirizine (ZYRTEC ALLERGY) 10 MG tablet, Take 1 tablet (10 mg total) by mouth daily., Disp: 90 tablet, Rfl: 1   cholecalciferol (VITAMIN D3) 25 MCG (1000 UNIT) tablet, Take 1,000 Units by mouth daily., Disp: , Rfl:    cyclobenzaprine (FLEXERIL) 10 MG tablet, Take 1 tablet (10 mg total) by mouth 3 (three) times daily as needed for muscle spasms., Disp: 10 tablet, Rfl: 0   doxycycline (VIBRA-TABS) 100 MG tablet, Take 1 tablet (100 mg total) by mouth 2 (two) times daily., Disp: 20 tablet, Rfl: 0   ferrous sulfate 325 (65 FE) MG EC tablet, Take 325 mg by mouth daily., Disp: , Rfl:    fluconazole (DIFLUCAN) 150 MG tablet, Take 1 tablet (150 mg total) by mouth every 3 (three) days as needed., Disp: 2 tablet, Rfl: 0   fluticasone (FLONASE) 50 MCG/ACT nasal spray, Place 2 sprays into both nostrils daily., Disp: 16 g, Rfl: 6   levETIRAcetam (KEPPRA) 1000 MG tablet, Take 1 tablet (1,000 mg total) by mouth 2 (two) times daily., Disp: 60 tablet, Rfl: 11   predniSONE (DELTASONE) 20 MG tablet, Take 2 tablets (40 mg total) by mouth daily with breakfast., Disp: 10 tablet, Rfl: 0   promethazine-dextromethorphan (PROMETHAZINE-DM) 6.25-15 MG/5ML syrup, Take 5 mLs by mouth 4 (four) times daily as needed for cough., Disp: 118 mL, Rfl: 0  Observations/Objective: Patient is well-developed, well-nourished in no acute distress.  Resting comfortably  at home.  Head is normocephalic, atraumatic.  No labored breathing.  Speech is clear and coherent with logical content.  Patient is alert and  oriented at baseline.    Assessment and Plan: 1. Dysuria  Symptoms are consistent with kidney stone advised patient to increase water intake  If pain is consistent or worsening ED visit for evaluation as discussed   With onset of fever/urinary urgency or frequency f/u for UTI evaluation     Patient is agreeable will send education on kidney stones   Follow Up Instructions: I discussed the assessment and treatment plan with the patient. The patient was provided an opportunity to ask questions and all were answered. The patient agreed with the plan and demonstrated an understanding of the instructions.  A copy of instructions were sent to the patient via MyChart unless otherwise noted below.    The patient was advised to call back or seek an in-person evaluation if the symptoms worsen or if the condition fails to improve as anticipated.  Time:  I spent 15 minutes with the patient via telehealth technology discussing the above problems/concerns.    Viviano Simas, FNP

## 2022-10-18 ENCOUNTER — Telehealth: Payer: Medicaid Other | Admitting: Nurse Practitioner

## 2022-10-18 DIAGNOSIS — J029 Acute pharyngitis, unspecified: Secondary | ICD-10-CM

## 2022-10-18 MED ORDER — FLUTICASONE PROPIONATE 50 MCG/ACT NA SUSP
2.0000 | Freq: Every day | NASAL | 0 refills | Status: DC
Start: 2022-10-18 — End: 2022-12-13

## 2022-10-18 MED ORDER — BENZONATATE 100 MG PO CAPS
100.0000 mg | ORAL_CAPSULE | Freq: Three times a day (TID) | ORAL | 0 refills | Status: DC | PRN
Start: 2022-10-18 — End: 2023-03-18

## 2022-10-18 NOTE — Progress Notes (Signed)
Virtual Visit Consent   Gabriella Montgomery, you are scheduled for a virtual visit with a [facility] provider today. Just as with appointments in the office, your consent must be obtained to participate. Your consent will be active for this visit and any virtual visit you may have with one of our providers in the next 365 days. If you have a MyChart account, a copy of this consent can be sent to you electronically.  As this is a virtual visit, video technology does not allow for your provider to perform a traditional examination. This may limit your provider's ability to fully assess your condition. If your provider identifies any concerns that need to be evaluated in person or the need to arrange testing (such as labs, EKG, etc.), we will make arrangements to do so. Although advances in technology are sophisticated, we cannot ensure that it will always work on either your end or our end. If the connection with a video visit is poor, the visit may have to be switched to a telephone visit. With either a video or telephone visit, we are not always able to ensure that we have a secure connection.  By engaging in this virtual visit, you consent to the provision of healthcare and authorize for your insurance to be billed (if applicable) for the services provided during this visit. Depending on your insurance coverage, you may receive a charge related to this service.  I need to obtain your verbal consent now. Are you willing to proceed with your visit today? Gabriella Montgomery has provided verbal consent on 10/18/2022 for a virtual visit (video or telephone). Gabriella Rigg, NP  Date: 10/18/2022 12:46 PM  Virtual Visit via Video Note   I, Gabriella Montgomery, connected with  Gabriella Montgomery  ([id number], 05-13-1985) on 10/18/22 at 12:15 PM EDT by a video-enabled telemedicine application and verified that I am speaking with the correct person using two identifiers.  Location: Patient: Virtual Visit Location  Patient: Home Provider: Virtual Visit Location Provider: Home Office   I discussed the limitations of evaluation and management by telemedicine and the availability of in person appointments. The patient expressed understanding and agreed to proceed.    History of Present Illness: Gabriella Montgomery is a 37 y.o. who identifies as a female who was assigned female at birth, and is being seen today for Viral pharyngitis.  Ms. Bost notes 2 days onset of sore throat, pain with swallowing, cough and nasal congestion. Tmax 101. She has not been around anyone diagnosed with strep throat throat or sinus infection.  Problems:  Patient Active Problem List   Diagnosis Date Noted   Vitamin D deficiency 10/12/2019   Seizure disorder (HCC) 08/18/2019   Migraine without aura and without status migrainosus, not intractable 08/18/2019   Class 3 severe obesity due to excess calories without serious comorbidity with body mass index (BMI) of 45.0 to 49.9 in adult [facility]) 08/18/2019   Normochromic anemia 08/18/2019   Pneumonia due to COVID-19 virus 07/19/2019   Seizure-like activity (HCC) 07/14/2019   History of pulmonary embolism 02/17/2016   Pulmonary embolism affecting pregnancy 01/14/2016    Allergies:  Allergies  Allergen Reactions   Azithromycin Anaphylaxis   Amoxicillin Hives    Has patient had a PCN reaction causing immediate rash, facial/tongue/throat swelling, SOB or lightheadedness with hypotension: No Has patient had a PCN reaction causing severe rash involving mucus membranes or skin necrosis: YEs Has patient had a PCN reaction that required hospitalization: No  Has patient had a PCN reaction occurring within the last 10 years: No If all of the above answers are "NO", then may proceed with Cephalosporin use.   Clindamycin Hives   Ibuprofen Hives   Tramadol Hives   Medications:  Current Outpatient Medications:    albuterol (VENTOLIN HFA) 108 (90 Base) MCG/ACT inhaler, Inhale 2 puffs into  the lungs every 6 (six) hours as needed for wheezing or shortness of breath., Disp: 8 g, Rfl: 0   benzonatate (TESSALON) 100 MG capsule, Take 1 capsule (100 mg total) by mouth 3 (three) times daily as needed for cough., Disp: 30 capsule, Rfl: 0   cetirizine (ZYRTEC ALLERGY) 10 MG tablet, Take 1 tablet (10 mg total) by mouth daily., Disp: 90 tablet, Rfl: 1   cholecalciferol (VITAMIN D3) 25 MCG (1000 UNIT) tablet, Take 1,000 Units by mouth daily., Disp: , Rfl:    cyclobenzaprine (FLEXERIL) 10 MG tablet, Take 1 tablet (10 mg total) by mouth 3 (three) times daily as needed for muscle spasms., Disp: 10 tablet, Rfl: 0   doxycycline (VIBRA-TABS) 100 MG tablet, Take 1 tablet (100 mg total) by mouth 2 (two) times daily., Disp: 20 tablet, Rfl: 0   ferrous sulfate 325 (65 FE) MG EC tablet, Take 325 mg by mouth daily., Disp: , Rfl:    fluconazole (DIFLUCAN) 150 MG tablet, Take 1 tablet (150 mg total) by mouth every 3 (three) days as needed., Disp: 2 tablet, Rfl: 0   fluticasone (FLONASE) 50 MCG/ACT nasal spray, Place 2 sprays into both nostrils daily., Disp: 16 g, Rfl: 0   levETIRAcetam (KEPPRA) 1000 MG tablet, Take 1 tablet (1,000 mg total) by mouth 2 (two) times daily., Disp: 60 tablet, Rfl: 11   predniSONE (DELTASONE) 20 MG tablet, Take 2 tablets (40 mg total) by mouth daily with breakfast., Disp: 10 tablet, Rfl: 0   promethazine-dextromethorphan (PROMETHAZINE-DM) 6.25-15 MG/5ML syrup, Take 5 mLs by mouth 4 (four) times daily as needed for cough., Disp: 118 mL, Rfl: 0  Observations/Objective: Patient is well-developed, well-nourished in no acute distress.  Resting comfortably  at home.  Head is normocephalic, atraumatic.  No labored breathing.  Speech is clear and coherent with logical content.  Patient is alert and oriented at baseline.    Assessment and Plan: 1. Viral pharyngitis - benzonatate (TESSALON) 100 MG capsule; Take 1 capsule (100 mg total) by mouth 3 (three) times daily as needed for  cough.  Dispense: 30 capsule; Refill: 0 - fluticasone (FLONASE) 50 MCG/ACT nasal spray; Place 2 sprays into both nostrils daily.  Dispense: 16 g; Refill: 0 May alternate with tylenol and motrin for sore throat pain Warm tea with honey to help soothe throat  Follow Up Instructions: I discussed the assessment and treatment plan with the patient. The patient was provided an opportunity to ask questions and all were answered. The patient agreed with the plan and demonstrated an understanding of the instructions.  A copy of instructions were sent to the patient via MyChart unless otherwise noted below.    The patient was advised to call back or seek an in-person evaluation if the symptoms worsen or if the condition fails to improve as anticipated.  Time:  I spent 11 minutes with the patient via telehealth technology discussing the above problems/concerns.    Gabriella Rigg, NP

## 2022-10-18 NOTE — Patient Instructions (Signed)
Gabriella Montgomery, thank you for joining [name], NP for today's virtual visit.  While this provider is not your primary care provider (PCP), if your PCP is located in our provider database this encounter information will be shared with them immediately following your visit.   A [facility] MyChart account gives you access to today's visit and all your visits, tests, and labs performed at [facility] " click here if you don't have a [facility] MyChart account or go to [email or web address]  Consent: (Patient) Gabriella Montgomery provided verbal consent for this virtual visit at the beginning of the encounter.  Current Medications:  Current Outpatient Medications:    albuterol (VENTOLIN HFA) 108 (90 Base) MCG/ACT inhaler, Inhale 2 puffs into the lungs every 6 (six) hours as needed for wheezing or shortness of breath., Disp: 8 g, Rfl: 0   benzonatate (TESSALON) 100 MG capsule, Take 1 capsule (100 mg total) by mouth 3 (three) times daily as needed for cough., Disp: 30 capsule, Rfl: 0   cetirizine (ZYRTEC ALLERGY) 10 MG tablet, Take 1 tablet (10 mg total) by mouth daily., Disp: 90 tablet, Rfl: 1   cholecalciferol (VITAMIN D3) 25 MCG (1000 UNIT) tablet, Take 1,000 Units by mouth daily., Disp: , Rfl:    cyclobenzaprine (FLEXERIL) 10 MG tablet, Take 1 tablet (10 mg total) by mouth 3 (three) times daily as needed for muscle spasms., Disp: 10 tablet, Rfl: 0   doxycycline (VIBRA-TABS) 100 MG tablet, Take 1 tablet (100 mg total) by mouth 2 (two) times daily., Disp: 20 tablet, Rfl: 0   ferrous sulfate 325 (65 FE) MG EC tablet, Take 325 mg by mouth daily., Disp: , Rfl:    fluconazole (DIFLUCAN) 150 MG tablet, Take 1 tablet (150 mg total) by mouth every 3 (three) days as needed., Disp: 2 tablet, Rfl: 0   fluticasone (FLONASE) 50 MCG/ACT nasal spray, Place 2 sprays into both nostrils daily., Disp: 16 g, Rfl: 0   levETIRAcetam (KEPPRA) 1000 MG tablet, Take 1 tablet (1,000 mg total) by mouth  2 (two) times daily., Disp: 60 tablet, Rfl: 11   predniSONE (DELTASONE) 20 MG tablet, Take 2 tablets (40 mg total) by mouth daily with breakfast., Disp: 10 tablet, Rfl: 0   promethazine-dextromethorphan (PROMETHAZINE-DM) 6.25-15 MG/5ML syrup, Take 5 mLs by mouth 4 (four) times daily as needed for cough., Disp: 118 mL, Rfl: 0   Medications ordered in this encounter:  Meds ordered this encounter  Medications   benzonatate (TESSALON) 100 MG capsule    Sig: Take 1 capsule (100 mg total) by mouth 3 (three) times daily as needed for cough.    Dispense:  30 capsule    Refill:  0    Order Specific Question:   Supervising Provider    Answer:   Merrilee Jansky [[id number]]   fluticasone (FLONASE) 50 MCG/ACT nasal spray    Sig: Place 2 sprays into both nostrils daily.    Dispense:  16 g    Refill:  0    Order Specific Question:   Supervising Provider    Answer:   Merrilee Jansky [id number]     *If you need refills on other medications prior to your next appointment, please contact your pharmacy*  Follow-Up: Call back or seek an in-person evaluation if the symptoms worsen or if the condition fails to improve as anticipated.  [facility] Health Virtual Care [phone]  Other Instructions May alternate with tylenol and motrin for sore throat pain Warm tea with  honey to help soothe throat   If you have been instructed to have an in-person evaluation today at a local Urgent Care facility, please use the link below. It will take you to a list of all of our available [facility], including address, phone number and hours of operation. Please do not delay care.  [place] [facility]  If you or a family member do not have a primary care provider, use the link below to schedule a visit and establish care. When you choose a  primary care physician or advanced practice provider, you gain a long-term partner in health. Find a Primary Care Provider  Learn more about [facility]'s in-office and virtual care options:  - Get Care Now

## 2022-12-08 ENCOUNTER — Telehealth: Payer: Medicaid Other | Admitting: Nurse Practitioner

## 2022-12-08 ENCOUNTER — Telehealth: Payer: Medicaid Other | Admitting: Family Medicine

## 2022-12-08 DIAGNOSIS — M25579 Pain in unspecified ankle and joints of unspecified foot: Secondary | ICD-10-CM

## 2022-12-08 NOTE — Progress Notes (Signed)
Gabriella Montgomery,   Because we cannot assess your ankle over E-visit you would need a separate visit for that. Also because the pain is radiating from your back to abdomen we would recommend an in person evaluation for your back. So that you are not charged for two visits we will cancel this so you are not charged. You can start by contacting your primary care office, if they are unable to see you today I have attached a list of local urgent care locations  All the best   NOTE: There will be NO CHARGE for this eVisit   If you are having a true medical emergency please call 911.      For an urgent face to face visit, [place] has eight urgent care centers for your convenience:   NEW!! [facility] Health Urgent Care Center at [facility] Get Driving Directions [phone] [address] [place], [address]    [facility] Health Urgent Care Center at  [facility] Get Driving Directions [phone] [address] [place], [place] [address]   [facility] Health Urgent Care Center [facility]) Get Driving Directions [phone] [address] [place], [place] [address]  [facility] Health Urgent Care Center [facility]) Get Driving Directions [phone] [address] [place],  [place]  [address]  [facility] Health Urgent Care Center [facility] - at [employer]  [phone] [place],  [place] [address]   [facility] Health Urgent Care at [facility] Get Driving Directions [phone] [address] [place], [place] [address]   [facility] at [facility] Get Driving Directions  [phone] [address] [place], [place] [address]   [facility] Health Urgent Care at [facility] Directions [phone] [address] [place], [place] [address]  Your MyChart E-visit questionnaire answers were reviewed by a board certified advanced  clinical practitioner to complete your personal care plan based on your specific symptoms.  Thank you for using e-Visits.

## 2022-12-08 NOTE — Progress Notes (Signed)
Gabriella Montgomery  Did Evisit and was already advised to be seen in person and forgot to cancel this appt. Patient acknowledged agreement and understanding of the plan.

## 2022-12-13 ENCOUNTER — Telehealth: Payer: Medicaid Other | Admitting: Nurse Practitioner

## 2022-12-13 DIAGNOSIS — J208 Acute bronchitis due to other specified organisms: Secondary | ICD-10-CM

## 2022-12-13 MED ORDER — ALBUTEROL SULFATE HFA 108 (90 BASE) MCG/ACT IN AERS
2.0000 | INHALATION_SPRAY | Freq: Four times a day (QID) | RESPIRATORY_TRACT | 0 refills | Status: DC | PRN
Start: 2022-12-13 — End: 2023-03-18

## 2022-12-13 MED ORDER — FLUTICASONE PROPIONATE 50 MCG/ACT NA SUSP
2.0000 | Freq: Every day | NASAL | 0 refills | Status: DC
Start: 2022-12-13 — End: 2023-07-11

## 2022-12-13 MED ORDER — PROMETHAZINE-DM 6.25-15 MG/5ML PO SYRP
5.0000 mL | ORAL_SOLUTION | Freq: Four times a day (QID) | ORAL | 0 refills | Status: DC | PRN
Start: 2022-12-13 — End: 2023-08-12

## 2022-12-13 NOTE — Progress Notes (Signed)
E-Visit for Cough  We are sorry that you are not feeling well.  Here is how we plan to help!  Based on your presentation I believe you most likely have viral bronchitis which does not require any antibiotics.      I have sent a prescription cough syrup, refilled nasal spray and inhaler. You can continue tylenol and motrin for pain and body aches   From your responses in the eVisit questionnaire you describe inflammation in the upper respiratory tract which is causing a significant cough.  This is commonly called Bronchitis and has four common causes:   Allergies Viral Infections Acid Reflux Bacterial Infection Allergies, viruses and acid reflux are treated by controlling symptoms or eliminating the cause. An example might be a cough caused by taking certain blood pressure medications. You stop the cough by changing the medication. Another example might be a cough caused by acid reflux. Controlling the reflux helps control the cough.  USE OF BRONCHODILATOR ("RESCUE") INHALERS: There is a risk from using your bronchodilator too frequently.  The risk is that over-reliance on a medication which only relaxes the muscles surrounding the breathing tubes can reduce the effectiveness of medications prescribed to reduce swelling and congestion of the tubes themselves.  Although you feel brief relief from the bronchodilator inhaler, your asthma may actually be worsening with the tubes becoming more swollen and filled with mucus.  This can delay other crucial treatments, such as oral steroid medications. If you need to use a bronchodilator inhaler daily, several times per day, you should discuss this with your provider.  There are probably better treatments that could be used to keep your asthma under control.     HOME CARE Only take medications as instructed by your medical team. Complete the entire course of an antibiotic. Drink plenty of fluids and get plenty of rest. Avoid close contacts especially  the very young and the elderly Cover your mouth if you cough or cough into your sleeve. Always remember to wash your hands A steam or ultrasonic humidifier can help congestion.   GET HELP RIGHT AWAY IF: You develop worsening fever. You become short of breath You cough up blood. Your symptoms persist after you have completed your treatment plan MAKE SURE YOU  Understand these instructions. Will watch your condition. Will get help right away if you are not doing well or get worse.    Thank you for choosing an e-visit.  Your e-visit answers were reviewed by a board certified advanced clinical practitioner to complete your personal care plan. Depending upon the condition, your plan could have included both over the counter or prescription medications.  Please review your pharmacy choice. Make sure the pharmacy is open so you can pick up prescription now. If there is a problem, you may contact your provider through [employer] and have the prescription routed to another pharmacy.  Your safety is important to [place]. If you have drug allergies check your prescription carefully.   For the next 24 hours you can use MyChart to ask questions about today's visit, request a non-urgent call back, or ask for a work or school excuse. You will get an email in the next two days asking about your experience. I hope that your e-visit has been valuable and will speed your recovery.

## 2022-12-13 NOTE — Progress Notes (Signed)
I have spent 5 minutes in review of e-visit questionnaire, review and updating patient chart, medical decision making and response to patient.  ° °Zelda W Fleming, NP ° °  °

## 2023-01-10 ENCOUNTER — Telehealth: Payer: Medicaid Other | Admitting: Nurse Practitioner

## 2023-01-10 DIAGNOSIS — J069 Acute upper respiratory infection, unspecified: Secondary | ICD-10-CM

## 2023-01-10 NOTE — Progress Notes (Signed)
I recommend scheduling an appointment with your Primary care for an office visit, labs and physical exam. You have not been seen in several years. If you have established in another city you can find local providers on the [employer] for your area  E-Visit for Upper Respiratory Infection   We are sorry you are not feeling well.  Here is how we plan to help!  Based on what you have shared with me, it looks like you may have a viral upper respiratory infection.  Upper respiratory infections are caused by a large number of viruses; however, rhinovirus is the most common cause.   Symptoms vary from person to person, with common symptoms including sore throat, cough, fatigue or lack of energy and feeling of general discomfort.  A low-grade fever of up to 100.4 may present, but is often uncommon.  Symptoms vary however, and are closely related to a person's age or underlying illnesses.  The most common symptoms associated with an upper respiratory infection are nasal discharge or congestion, cough, sneezing, headache and pressure in the ears and face.  These symptoms usually persist for about 3 to 10 days, but can last up to 2 weeks.  It is important to know that upper respiratory infections do not cause serious illness or complications in most cases.    Upper respiratory infections can be transmitted from person to person, with the most common method of transmission being a person's hands.  The virus is able to live on the skin and can infect other persons for up to 2 hours after direct contact.  Also, these can be transmitted when someone coughs or sneezes; thus, it is important to cover the mouth to reduce this risk.  To keep the spread of the illness at bay, good hand hygiene is very important.  This is an infection that is most likely caused by a virus. There are no specific treatments other than to help you with the symptoms until the infection runs its course.  We are sorry you are not feeling  well.  Here is how we plan to help!   For nasal congestion, you may use an oral decongestants such as Mucinex D or if you have glaucoma or high blood pressure use plain Mucinex.  Saline nasal spray or nasal drops can help and can safely be used as often as needed for congestion.  For your congestion, I recommend you continue Fluticasone nasal spray one spray in each nostril twice a day  If you do not have a history of heart disease, hypertension, diabetes or thyroid disease, prostate/bladder issues or glaucoma, you may also use Sudafed to treat nasal congestion.  It is highly recommended that you consult with a pharmacist or your primary care physician to ensure this medication is safe for you to take.     If you have a cough, you may use cough suppressants such as Delsym and Robitussin.  If you have glaucoma or high blood pressure, you can also use Coricidin HBP.   For cough I recommend mucinex DM   If you have a sore or scratchy throat, use a saltwater gargle-  to  teaspoon of salt dissolved in a 4-ounce to 8-ounce glass of warm water.  Gargle the solution for approximately 15-30 seconds and then spit.  It is important not to swallow the solution.  You can also use throat lozenges/cough drops and Chloraseptic spray to help with throat pain or discomfort.  Warm or cold liquids can also be  helpful in relieving throat pain.  For headache, pain or general discomfort, you can use Ibuprofen or Tylenol as directed.   Some authorities believe that zinc sprays or the use of Echinacea may shorten the course of your symptoms.   HOME CARE Only take medications as instructed by your medical team. Be sure to drink plenty of fluids. Water is fine as well as fruit juices, sodas and electrolyte beverages. You may want to stay away from caffeine or alcohol. If you are nauseated, try taking small sips of liquids. How do you know if you are getting enough fluid? Your urine should be a pale yellow or almost  colorless. Get rest. Taking a steamy shower or using a humidifier may help nasal congestion and ease sore throat pain. You can place a towel over your head and breathe in the steam from hot water coming from a faucet. Using a saline nasal spray works much the same way. Cough drops, hard candies and sore throat lozenges may ease your cough. Avoid close contacts especially the very young and the elderly Cover your mouth if you cough or sneeze Always remember to wash your hands.   GET HELP RIGHT AWAY IF: You develop worsening fever. If your symptoms do not improve within 10 days You develop yellow or green discharge from your nose over 3 days. You have coughing fits You develop a severe head ache or visual changes. You develop shortness of breath, difficulty breathing or start having chest pain Your symptoms persist after you have completed your treatment plan  MAKE SURE YOU  Understand these instructions. Will watch your condition. Will get help right away if you are not doing well or get worse.  Thank you for choosing an e-visit.  Your e-visit answers were reviewed by a board certified advanced clinical practitioner to complete your personal care plan. Depending upon the condition, your plan could have included both over the counter or prescription medications.  Please review your pharmacy choice. Make sure the pharmacy is open so you can pick up prescription now. If there is a problem, you may contact your provider through [employer] and have the prescription routed to another pharmacy.  Your safety is important to [place]. If you have drug allergies check your prescription carefully.   For the next 24 hours you can use MyChart to ask questions about today's visit, request a non-urgent call back, or ask for a work or school excuse. You will get an email in the next two days asking about your experience. I hope that your e-visit has been valuable and will speed your recovery.

## 2023-01-10 NOTE — Progress Notes (Signed)
I have spent 5 minutes in review of e-visit questionnaire, review and updating patient chart, medical decision making and response to patient.  ° °Jerrell Mangel W Secilia Apps, NP ° °  °

## 2023-01-24 ENCOUNTER — Encounter: Payer: Medicaid Other | Admitting: Nurse Practitioner

## 2023-01-24 DIAGNOSIS — R5383 Other fatigue: Secondary | ICD-10-CM

## 2023-01-24 NOTE — Progress Notes (Signed)
Gabriella Montgomery logged into appointment at scheduled time but left and did not return for visit.

## 2023-01-25 ENCOUNTER — Telehealth: Payer: Medicaid Other | Admitting: Emergency Medicine

## 2023-01-25 DIAGNOSIS — J019 Acute sinusitis, unspecified: Secondary | ICD-10-CM

## 2023-01-25 DIAGNOSIS — B9689 Other specified bacterial agents as the cause of diseases classified elsewhere: Secondary | ICD-10-CM

## 2023-01-25 MED ORDER — DOXYCYCLINE HYCLATE 100 MG PO CAPS: 100.0000 mg | ORAL_CAPSULE | Freq: Two times a day (BID) | ORAL | 0 refills | Status: DC

## 2023-01-25 NOTE — Progress Notes (Signed)
E-Visit for Sinus Problems  We are sorry that you are not feeling well.  Here is how we plan to help!  Based on what you have shared with me it looks like you have sinusitis.    You could have a dental infection as well.  We will start an antibiotic that should treat both.  Sinusitis is inflammation and infection in the sinus cavities of the head.  Based on your presentation I believe you most likely have Acute Bacterial Sinusitis.  This is an infection caused by bacteria and is treated with antibiotics. I have prescribed Doxycycline 100mg  by mouth twice a day for 10 days. You may use an oral decongestant such as Mucinex D or if you have glaucoma or high blood pressure use plain Mucinex. Saline nasal spray help and can safely be used as often as needed for congestion.  If you develop worsening sinus pain, fever or notice severe headache and vision changes, or if symptoms are not better after completion of antibiotic, please schedule an appointment with a health care provider.    Sinus infections are not as easily transmitted as other respiratory infection, however we still recommend that you avoid close contact with loved ones, especially the very young and elderly.  Remember to wash your hands thoroughly throughout the day as this is the number one way to prevent the spread of infection!  Home Care: Only take medications as instructed by your medical team. Complete the entire course of an antibiotic. Do not take these medications with alcohol. A steam or ultrasonic humidifier can help congestion.  You can place a towel over your head and breathe in the steam from hot water coming from a faucet. Avoid close contacts especially the very young and the elderly. Cover your mouth when you cough or sneeze. Always remember to wash your hands.  Get Help Right Away If: You develop worsening fever or sinus pain. You develop a severe head ache or visual changes. Your symptoms persist after you have  completed your treatment plan.  Make sure you Understand these instructions. Will watch your condition. Will get help right away if you are not doing well or get worse.  Thank you for choosing an e-visit.  Your e-visit answers were reviewed by a board certified advanced clinical practitioner to complete your personal care plan. Depending upon the condition, your plan could have included both over the counter or prescription medications.  Please review your pharmacy choice. Make sure the pharmacy is open so you can pick up prescription now. If there is a problem, you may contact your provider through [employer] and have the prescription routed to another pharmacy.  Your safety is important to [place]. If you have drug allergies check your prescription carefully.   For the next 24 hours you can use MyChart to ask questions about today's visit, request a non-urgent call back, or ask for a work or school excuse. You will get an email in the next two days asking about your experience. I hope that your e-visit has been valuable and will speed your recovery. Approximately 5 minutes was used in reviewing the patient's chart, questionnaire, prescribing medications, and documentation.

## 2023-01-28 ENCOUNTER — Telehealth: Payer: Medicaid Other | Admitting: Physician Assistant

## 2023-01-28 DIAGNOSIS — R079 Chest pain, unspecified: Secondary | ICD-10-CM

## 2023-01-28 DIAGNOSIS — R220 Localized swelling, mass and lump, head: Secondary | ICD-10-CM

## 2023-01-28 NOTE — Progress Notes (Signed)
Because of chest pain and no ER visit on file in our system we can review, I feel your condition warrants further evaluation and I recommend that you be seen in a face to face visit. I recommend if your PCP is unavailable to at least be seen at one of the locations below.   NOTE: There will be NO CHARGE for this eVisit   If you are having a true medical emergency please call 911.      For an urgent face to face visit, [place] has eight urgent care centers for your convenience:   NEW!! [facility] Health Urgent Care Center at [facility] Get Driving Directions [phone] [address] [place], [address]    [facility] Health Urgent Care Center at [facility] Get Driving Directions [phone] [address] [place], [place] [address]   [facility] Health Urgent Care Center [facility]) Get Driving Directions [phone] [address] [place], [place] [address]  [facility] Health Urgent Care Center [facility]) Get Driving Directions [phone] [address] [place],  [place]  [address]  [facility] Health Urgent Care Center [facility] - at [employer]  [phone] [address] [place],  [place] [address]   [facility] Health Urgent Care at [facility] Get Driving Directions [phone] [address] [place], [place] [address]   [facility] Health Urgent Care at [facility] Get Driving Directions  [phone] [address] [place], [place] [address]   [facility] Health Urgent Care at [facility] Directions [phone] [address] [place], [place] [address]  Your MyChart E-visit questionnaire answers were reviewed by a board certified advanced clinical practitioner to complete your personal care plan based on your specific symptoms.  Thank you for using e-Visits.

## 2023-03-18 ENCOUNTER — Telehealth: Payer: Medicaid Other | Admitting: Emergency Medicine

## 2023-03-18 DIAGNOSIS — R0981 Nasal congestion: Secondary | ICD-10-CM

## 2023-03-18 DIAGNOSIS — R062 Wheezing: Secondary | ICD-10-CM

## 2023-03-18 DIAGNOSIS — R051 Acute cough: Secondary | ICD-10-CM

## 2023-03-18 MED ORDER — PREDNISONE 20 MG PO TABS: 40.0000 mg | ORAL_TABLET | Freq: Every day | ORAL | 0 refills | Status: DC

## 2023-03-18 MED ORDER — BENZONATATE 100 MG PO CAPS: 100.0000 mg | ORAL_CAPSULE | Freq: Two times a day (BID) | ORAL | 0 refills | Status: DC | PRN

## 2023-03-18 MED ORDER — DOXYCYCLINE HYCLATE 100 MG PO CAPS: 100.0000 mg | ORAL_CAPSULE | Freq: Two times a day (BID) | ORAL | 0 refills | Status: DC

## 2023-03-18 MED ORDER — ALBUTEROL SULFATE HFA 108 (90 BASE) MCG/ACT IN AERS: 2.0000 | INHALATION_SPRAY | RESPIRATORY_TRACT | 0 refills | Status: AC | PRN

## 2023-03-18 NOTE — Progress Notes (Signed)
E-Visit for Cough   We are sorry that you are not feeling well.  Here is how we plan to help!  Based on your presentation I believe you most likely have A cough due to bacteria.  When patients have a fever and a productive cough with a change in color or increased sputum production, we are concerned about bacterial bronchitis.  If left untreated it can progress to pneumonia.  If your symptoms do not improve with your treatment plan it is important that you contact your provider.   I have prescribed Doxycycline 100 mg twice a day for 7 days     In addition you may use A prescription cough medication called Tessalon Perles 100mg . You may take 1-2 capsules every 8 hours as needed for your cough.  I've also prescribed some prednisone, take as directed and refilled your albuterol inhaler.  From your responses in the eVisit questionnaire you describe inflammation in the upper respiratory tract which is causing a significant cough.  This is commonly called Bronchitis and has four common causes:   Allergies Viral Infections Acid Reflux Bacterial Infection Allergies, viruses and acid reflux are treated by controlling symptoms or eliminating the cause. An example might be a cough caused by taking certain blood pressure medications. You stop the cough by changing the medication. Another example might be a cough caused by acid reflux. Controlling the reflux helps control the cough.  USE OF BRONCHODILATOR ("RESCUE") INHALERS: There is a risk from using your bronchodilator too frequently.  The risk is that over-reliance on a medication which only relaxes the muscles surrounding the breathing tubes can reduce the effectiveness of medications prescribed to reduce swelling and congestion of the tubes themselves.  Although you feel brief relief from the bronchodilator inhaler, your asthma may actually be worsening with the tubes becoming more swollen and filled with mucus.  This can delay other crucial treatments,  such as oral steroid medications. If you need to use a bronchodilator inhaler daily, several times per day, you should discuss this with your provider.  There are probably better treatments that could be used to keep your asthma under control.     HOME CARE Only take medications as instructed by your medical team. Complete the entire course of an antibiotic. Drink plenty of fluids and get plenty of rest. Avoid close contacts especially the very young and the elderly Cover your mouth if you cough or cough into your sleeve. Always remember to wash your hands A steam or ultrasonic humidifier can help congestion.   GET HELP RIGHT AWAY IF: You develop worsening fever. You become short of breath You cough up blood. Your symptoms persist after you have completed your treatment plan MAKE SURE YOU  Understand these instructions. Will watch your condition. Will get help right away if you are not doing well or get worse.    Thank you for choosing an e-visit.  Your e-visit answers were reviewed by a board certified advanced clinical practitioner to complete your personal care plan. Depending upon the condition, your plan could have included both over the counter or prescription medications.  Please review your pharmacy choice. Make sure the pharmacy is open so you can pick up prescription now. If there is a problem, you may contact your provider through [employer] and have the prescription routed to another pharmacy.  Your safety is important to [place]. If you have drug allergies check your prescription carefully.   For the next 24 hours you can use MyChart  to ask questions about today's visit, request a non-urgent call back, or ask for a work or school excuse. You will get an email in the next two days asking about your experience. I hope that your e-visit has been valuable and will speed your recovery.  Approximately 5 minutes was used in reviewing the patient's chart, questionnaire,  prescribing medications, and documentation.

## 2023-07-08 ENCOUNTER — Telehealth: Admitting: Physician Assistant

## 2023-07-08 DIAGNOSIS — J019 Acute sinusitis, unspecified: Secondary | ICD-10-CM | POA: Diagnosis not present

## 2023-07-08 DIAGNOSIS — B9689 Other specified bacterial agents as the cause of diseases classified elsewhere: Secondary | ICD-10-CM

## 2023-07-08 MED ORDER — PREDNISONE 10 MG (21) PO TBPK: ORAL_TABLET | ORAL | 0 refills | Status: DC

## 2023-07-08 MED ORDER — DOXYCYCLINE HYCLATE 100 MG PO TABS
100.0000 mg | ORAL_TABLET | Freq: Two times a day (BID) | ORAL | 0 refills | Status: DC
Start: 2023-07-08 — End: 2023-08-12

## 2023-07-08 NOTE — Progress Notes (Signed)
 I have spent 5 minutes in review of e-visit questionnaire, review and updating patient chart, medical decision making and response to patient.   [name], PA-C

## 2023-07-08 NOTE — Progress Notes (Signed)
 Message sent to patient requesting further input regarding current symptoms. Awaiting patient response.

## 2023-07-08 NOTE — Progress Notes (Signed)

## 2023-07-11 ENCOUNTER — Telehealth: Admitting: Family Medicine

## 2023-07-11 ENCOUNTER — Telehealth: Admitting: Physician Assistant

## 2023-07-11 DIAGNOSIS — H6992 Unspecified Eustachian tube disorder, left ear: Secondary | ICD-10-CM

## 2023-07-11 MED ORDER — CETIRIZINE HCL 10 MG PO TABS: 10.0000 mg | ORAL_TABLET | Freq: Every day | ORAL | 1 refills | Status: DC

## 2023-07-11 MED ORDER — FLUTICASONE PROPIONATE 50 MCG/ACT NA SUSP: 2.0000 | Freq: Every day | NASAL | 0 refills | Status: DC

## 2023-07-11 NOTE — Progress Notes (Signed)
 I have spent 5 minutes in review of e-visit questionnaire, review and updating patient chart, medical decision making and response to patient.   Laure Kidney, PA-C

## 2023-07-11 NOTE — Progress Notes (Signed)
 Duplicate [facility]- See other note

## 2023-07-11 NOTE — Progress Notes (Signed)
 E-Visit for Ear Pain - Eustachian Tube Dysfunction   We are sorry that you are not feeling well. Here is how we plan to help!  Based on what you have shared with me it looks like you have Eustachian Tube Dysfunction.  Eustachian Tube Dysfunction is a condition where the tubes that connect your middle ears to your upper throat become blocked. This can lead to discomfort, hearing difficulties and a feeling of fullness in your ear. Eustachian tube dysfunction usually resolves itself in a few days. The usual symptoms include: Hearing problems Tinnitus, or ringing in your ears Clicking or popping sounds A feeling of fullness in your ears Pain that mimics an ear infection Dizziness, vertigo or balance problems A "tickling" sensation in your ears  ?Eustachian tube dysfunction symptoms may get worse in higher altitudes. This is called barotrauma, and it can happen while scuba diving, flying in an airplane or driving in the mountains.   What causes eustachian tube dysfunction? Allergies and infections (like the common cold and the flu) are the most common causes of eustachian tube dysfunction. These conditions can cause inflammation and mucus buildup, leading to blockage. GERD, or chronic acid reflux, can also cause ETD. This is because stomach acid can back up into your throat and result in inflammation. As mentioned above, altitude changes can also cause ETD.   What are some common eustachian tube dysfunction treatments? In most cases, treatment isn't necessary because ETD often resolves on its own. However, you might need treatment if your symptoms linger for more than two weeks.    Eustachian tube dysfunction treatment depends on the cause and the severity of your condition. Treatments may include home remedies, medications or, in severe cases, surgery.     HOME CARE: Sometimes simple home remedies can help with mild cases of eustachian tube dysfunction. To try and clear the blockage, you  can: Chew gum. Yawn. Swallow. Try the Valsalva maneuver (breathing out forcefully while closing your mouth and pinching your nostrils). Use a saline spray to clear out nasal passages.  MEDICATIONS: Over-the-counter medications can help if allergies are causing eustachian tube dysfunction. Try antihistamines (like cetirizine or diphenhydramine) to ease your symptoms. If you have discomfort, pain relievers -- such as acetaminophen or ibuprofen -- can help.  Sometimes intranasal glucocorticosteroids (like Flonase or Nasacort) help.  I have prescribed Fluticasone 50 mcg/spray 2 sprays in each nostril daily for 10-14 days    GET HELP RIGHT AWAY IF: Fever is over 102.2 degrees. You develop progressive ear pain or hearing loss. Ear symptoms persist longer than 3 days after treatment.  MAKE SURE YOU: Understand these instructions. Will watch your condition. Will get help right away if you are not doing well or get worse.  Thank you for choosing an e-visit.  Your e-visit answers were reviewed by a board certified advanced clinical practitioner to complete your personal care plan. Depending upon the condition, your plan could have included both over the counter or prescription medications.  Please review your pharmacy choice. Make sure the pharmacy is open so you can pick up the prescription now. If there is a problem, you may contact your provider through [employer] and have the prescription routed to another pharmacy.  Your safety is important to [place]. If you have drug allergies check your prescription carefully.   For the next 24 hours you can use MyChart to ask questions about today's visit, request a non-urgent call back, or ask for a work or school excuse. You will  get an email with a survey after your eVisit asking about your experience. We would appreciate your feedback. I hope that your e-visit has been valuable and will aid in your recovery.     d

## 2023-07-17 ENCOUNTER — Telehealth: Admitting: Nurse Practitioner

## 2023-07-17 DIAGNOSIS — J069 Acute upper respiratory infection, unspecified: Secondary | ICD-10-CM

## 2023-07-17 MED ORDER — BENZONATATE 200 MG PO CAPS: 200.0000 mg | ORAL_CAPSULE | Freq: Two times a day (BID) | ORAL | 0 refills | Status: DC | PRN

## 2023-07-17 NOTE — Progress Notes (Signed)
 E-Visit for Cough   We are sorry that you are not feeling well.  Here is how we plan to help!  Based on your presentation I believe you most likely have A cough due to a virus.  This is called viral bronchitis and is best treated by rest, plenty of fluids and control of the cough.  You may use Ibuprofen or Tylenol as directed to help your symptoms.     In addition you may use A prescription cough medication called Tessalon Perles 100mg . You may take 1-2 capsules every 8 hours as needed for your cough.   From your responses in the eVisit questionnaire you describe inflammation in the upper respiratory tract which is causing a significant cough.  This is commonly called Bronchitis and has four common causes:   Allergies Viral Infections Acid Reflux Bacterial Infection Allergies, viruses and acid reflux are treated by controlling symptoms or eliminating the cause. An example might be a cough caused by taking certain blood pressure medications. You stop the cough by changing the medication. Another example might be a cough caused by acid reflux. Controlling the reflux helps control the cough.  USE OF BRONCHODILATOR ("RESCUE") INHALERS: There is a risk from using your bronchodilator too frequently.  The risk is that over-reliance on a medication which only relaxes the muscles surrounding the breathing tubes can reduce the effectiveness of medications prescribed to reduce swelling and congestion of the tubes themselves.  Although you feel brief relief from the bronchodilator inhaler, your asthma may actually be worsening with the tubes becoming more swollen and filled with mucus.  This can delay other crucial treatments, such as oral steroid medications. If you need to use a bronchodilator inhaler daily, several times per day, you should discuss this with your provider.  There are probably better treatments that could be used to keep your asthma under control.     HOME CARE Only take medications as  instructed by your medical team. Complete the entire course of an antibiotic. Drink plenty of fluids and get plenty of rest. Avoid close contacts especially the very young and the elderly Cover your mouth if you cough or cough into your sleeve. Always remember to wash your hands A steam or ultrasonic humidifier can help congestion.   GET HELP RIGHT AWAY IF: You develop worsening fever. You become short of breath You cough up blood. Your symptoms persist after you have completed your treatment plan MAKE SURE YOU  Understand these instructions. Will watch your condition. Will get help right away if you are not doing well or get worse.    Thank you for choosing an e-visit.  Your e-visit answers were reviewed by a board certified advanced clinical practitioner to complete your personal care plan. Depending upon the condition, your plan could have included both over the counter or prescription medications.  Please review your pharmacy choice. Make sure the pharmacy is open so you can pick up prescription now. If there is a problem, you may contact your provider through [employer] and have the prescription routed to another pharmacy.  Your safety is important to [place]. If you have drug allergies check your prescription carefully.   For the next 24 hours you can use MyChart to ask questions about today's visit, request a non-urgent call back, or ask for a work or school excuse. You will get an email in the next two days asking about your experience. I hope that your e-visit has been valuable and will speed your recovery.

## 2023-07-17 NOTE — Progress Notes (Signed)
 I have spent 5 minutes in review of e-visit questionnaire, review and updating patient chart, medical decision making and response to patient.   [name], NP

## 2023-08-05 ENCOUNTER — Telehealth: Admitting: Physician Assistant

## 2023-08-05 DIAGNOSIS — S0590XA Unspecified injury of unspecified eye and orbit, initial encounter: Secondary | ICD-10-CM

## 2023-08-05 DIAGNOSIS — H532 Diplopia: Secondary | ICD-10-CM

## 2023-08-05 NOTE — Progress Notes (Signed)
  Because of trauma to the eye and double vision, I feel your condition warrants further evaluation and I recommend that you be seen in a face-to-face visit.   NOTE: There will be NO CHARGE for this E-Visit   If you are having a true medical emergency, please call 911.     For an urgent face to face visit, [facility] has multiple urgent care centers for your convenience.  Click the link below for the full list of locations and hours, walk-in wait times, appointment scheduling options and driving directions:  Urgent Care - [place], [place], [place], [address], [place], [place]     Your MyChart E-visit questionnaire answers were reviewed by a board certified advanced clinical practitioner to complete your personal care plan based on your specific symptoms.    Thank you for using e-Visits.

## 2023-08-12 ENCOUNTER — Telehealth: Admitting: Physician Assistant

## 2023-08-12 DIAGNOSIS — J310 Chronic rhinitis: Secondary | ICD-10-CM | POA: Diagnosis not present

## 2023-08-12 MED ORDER — FLUTICASONE PROPIONATE 50 MCG/ACT NA SUSP: 2.0000 | Freq: Every day | NASAL | 0 refills | Status: DC

## 2023-08-12 MED ORDER — LEVOCETIRIZINE DIHYDROCHLORIDE 5 MG PO TABS: 5.0000 mg | ORAL_TABLET | Freq: Every evening | ORAL | 0 refills | Status: AC

## 2023-08-12 NOTE — Patient Instructions (Signed)
 Gabriella Montgomery, thank you for joining Hyla Maillard, PA-C for today's virtual visit.  While this provider is not your primary care provider (PCP), if your PCP is located in our provider database this encounter information will be shared with them immediately following your visit.   A [employer] MyChart account gives you access to today's visit and all your visits, tests, and labs performed at [facility] " click here if you don't have a [facility] MyChart account or go to [email or web address]  Consent: (Patient) Gabriella Montgomery provided verbal consent for this virtual visit at the beginning of the encounter.  Current Medications:  Current Outpatient Medications:    albuterol (VENTOLIN HFA) 108 (90 Base) MCG/ACT inhaler, Inhale 2 puffs into the lungs every 4 (four) hours as needed for wheezing or shortness of breath., Disp: 1 each, Rfl: 0   benzonatate (TESSALON) 200 MG capsule, Take 1 capsule (200 mg total) by mouth 2 (two) times daily as needed for cough., Disp: 30 capsule, Rfl: 0   cetirizine (ZYRTEC ALLERGY) 10 MG tablet, Take 1 tablet (10 mg total) by mouth daily., Disp: 90 tablet, Rfl: 1   cholecalciferol (VITAMIN D3) 25 MCG (1000 UNIT) tablet, Take 1,000 Units by mouth daily., Disp: , Rfl:    cyclobenzaprine (FLEXERIL) 10 MG tablet, Take 1 tablet (10 mg total) by mouth 3 (three) times daily as needed for muscle spasms., Disp: 10 tablet, Rfl: 0   doxycycline (VIBRA-TABS) 100 MG tablet, Take 1 tablet (100 mg total) by mouth 2 (two) times daily., Disp: 20 tablet, Rfl: 0   ferrous sulfate 325 (65 FE) MG EC tablet, Take 325 mg by mouth daily., Disp: , Rfl:    fluconazole (DIFLUCAN) 150 MG tablet, Take 1 tablet (150 mg total) by mouth every 3 (three) days as needed., Disp: 2 tablet, Rfl: 0   fluticasone (FLONASE) 50 MCG/ACT nasal spray, Place 2 sprays into both nostrils daily., Disp: 16 g, Rfl: 0   levETIRAcetam (KEPPRA) 1000 MG tablet, Take 1 tablet (1,000 mg total)  by mouth 2 (two) times daily., Disp: 60 tablet, Rfl: 11   predniSONE (STERAPRED UNI-PAK 21 TAB) 10 MG (21) TBPK tablet, Take following package directions, Disp: 21 tablet, Rfl: 0   promethazine-dextromethorphan (PROMETHAZINE-DM) 6.25-15 MG/5ML syrup, Take 5 mLs by mouth 4 (four) times daily as needed for cough., Disp: 120 mL, Rfl: 0   Medications ordered in this encounter:  No orders of the defined types were placed in this encounter.    *If you need refills on other medications prior to your next appointment, please contact your pharmacy*  Follow-Up: Call back or seek an in-person evaluation if the symptoms worsen or if the condition fails to improve as anticipated.  [facility] [phone]  Other Instructions Allergic Rhinitis, Adult  Allergic rhinitis is a reaction to allergens. Allergens are things that can cause an allergic reaction. This condition affects the lining inside the nose (mucous membrane). There are two types of allergic rhinitis: Seasonal. This type is also called hay fever. It happens only during some times of the year. Perennial. This type can happen at any time of the year. This condition cannot be spread from person to person (is not contagious). It can be mild, bad, or very bad. It can develop at any age and may be outgrown. What are the causes? Pollen from grasses, trees, and weeds. Other causes can be: Dust mites. Smoke. Mold. Car fumes. The pee (urine), spit, or dander of pets.  Dander is dead skin cells from a pet. What increases the risk? You are more likely to develop this condition if: You have allergies in your family. You have problems like allergies in your family. You may have: Swelling of parts of your eyes and eyelids. Asthma. This affects how you breathe. Long-term redness and swelling on your skin. Food allergies. What are the signs or symptoms? The main symptom of this condition is a runny or stuffy nose (nasal congestion).  Other symptoms may include: Sneezing or coughing. Itching and tearing of your eyes. Mucus that drips down the back of your throat (postnasal drip). This may cause a sore throat. Trouble sleeping. Feeling tired. Headache. How is this treated? There is no cure for this condition. You should avoid things that you are allergic to. Treatment can help to relieve symptoms. This may include: Medicines that block allergy symptoms, such as anti-inflammatories or antihistamines. These may be given as a shot, nasal spray, or pill. Avoiding things you are allergic to. Medicines that give you some of what you are allergic to over time. This is called immunotherapy. It is done if other treatments do not help. You may get: Shots. Medicine under your tongue. Stronger medicines, if other treatments do not help. Follow these instructions at home: Avoiding allergens Find out what things you are allergic to and avoid them. To do this, try these things: If you get allergies any time of year: Replace carpet with wood, tile, or vinyl flooring. Carpet can trap pet dander and dust. Do not smoke. Do not allow smoking in your home. Change your heating and air conditioning filters at least once a month. If you get allergies only some times of the year: Keep windows closed when you can. Plan things to do outside when pollen counts are lowest. Check pollen counts before you plan things to do outside. When you come indoors, change your clothes and shower before you sit on furniture or bedding. If you are allergic to a pet: Keep the pet out of your bedroom. Vacuum, sweep, and dust often. General instructions Take over-the-counter and prescription medicines only as told by your doctor. Drink enough fluid to keep your pee pale yellow. Where to find more information American Academy of Allergy, Asthma & Immunology: aaaai.org Contact a doctor if: You have a fever. You get a cough that does not go away. You make  high-pitched whistling sounds when you breathe, most often when you breathe out (wheeze). Your symptoms slow you down. Your symptoms stop you from doing your normal things each day. Get help right away if: You are short of breath. This symptom may be an emergency. Get help right away. Call 911. Do not wait to see if the symptom will go away. Do not drive yourself to the hospital. This information is not intended to replace advice given to you by your health care provider. Make sure you discuss any questions you have with your health care provider. Document Revised: 12/22/2021 Document Reviewed: 12/22/2021 Elsevier Patient Education  2024 Elsevier Inc.   If you have been instructed to have an in-person evaluation today at a local Urgent Care facility, please use the link below. It will take you to a list of all of our available [facility], including address, phone number and hours of operation. Please do not delay care.  [facility]  If you or a family member do not have a primary care provider, use the link below to schedule a  visit and establish care. When you choose a [facility] primary care physician or advanced practice provider, you gain a long-term partner in health. Find a Primary Care Provider  Learn more about [facility]'s in-office and virtual care options: [facility] - Get Care Now

## 2023-08-12 NOTE — Progress Notes (Signed)
 Virtual Visit Consent   Gabriella Montgomery, you are scheduled for a virtual visit with a [facility] provider today. Just as with appointments in the office, your consent must be obtained to participate. Your consent will be active for this visit and any virtual visit you may have with one of our providers in the next 365 days. If you have a MyChart account, a copy of this consent can be sent to you electronically.  As this is a virtual visit, video technology does not allow for your provider to perform a traditional examination. This may limit your provider's ability to fully assess your condition. If your provider identifies any concerns that need to be evaluated in person or the need to arrange testing (such as labs, EKG, etc.), we will make arrangements to do so. Although advances in technology are sophisticated, we cannot ensure that it will always work on either your end or our end. If the connection with a video visit is poor, the visit may have to be switched to a telephone visit. With either a video or telephone visit, we are not always able to ensure that we have a secure connection.  By engaging in this virtual visit, you consent to the provision of healthcare and authorize for your insurance to be billed (if applicable) for the services provided during this visit. Depending on your insurance coverage, you may receive a charge related to this service.  I need to obtain your verbal consent now. Are you willing to proceed with your visit today? Gabriella Montgomery has provided verbal consent on 08/12/2023 for a virtual visit (video or telephone). [name], [place]  Date: 08/12/2023 8:28 AM   Virtual Visit via Video Note   I, [name], connected with  Gabriella Montgomery  ([id number], [birth year]) on 08/12/23 at  8:30 AM EDT by a video-enabled telemedicine application and verified that I am speaking with the correct person using two identifiers.  Location: Patient: Virtual Visit  Location Patient: Home Provider: Virtual Visit Location Provider: Home Office   I discussed the limitations of evaluation and management by telemedicine and the availability of in person appointments. The patient expressed understanding and agreed to proceed.    History of Present Illness: Gabriella Montgomery is a 38 y.o. who identifies as a female who was assigned female at birth, and is being seen today for nasal congestion, post-nasal drip, rhinorrhea associated with dry cough. Denies fever, chills. Denies facial or sinus pain. Ear pressure and popping with sneezing.   OTC -- Benadryl.  HPI: HPI  Problems:  Patient Active Problem List   Diagnosis Date Noted   Vitamin D deficiency 10/12/2019   Seizure disorder (HCC) 08/18/2019   Migraine without aura and without status migrainosus, not intractable 08/18/2019   Class 3 severe obesity due to excess calories without serious comorbidity with body mass index (BMI) of 45.0 to 49.9 in adult [facility]) 08/18/2019   Normochromic anemia 08/18/2019   Pneumonia due to COVID-19 virus 07/19/2019   Seizure-like activity (HCC) 07/14/2019   History of pulmonary embolism 02/17/2016   Pulmonary embolism affecting pregnancy 01/14/2016    Allergies:  Allergies  Allergen Reactions   Azithromycin Anaphylaxis   Amoxicillin Hives    Has patient had a PCN reaction causing immediate rash, facial/tongue/throat swelling, SOB or lightheadedness with hypotension: No Has patient had a PCN reaction causing severe rash involving mucus membranes or skin necrosis: YEs Has patient had a PCN reaction that required hospitalization: No Has patient  had a PCN reaction occurring within the last 10 years: No If all of the above answers are "NO", then may proceed with Cephalosporin use.   Clindamycin Hives   Ibuprofen Hives   Tramadol Hives   Medications:  Current Outpatient Medications:    fluticasone (FLONASE) 50 MCG/ACT nasal spray, Place 2 sprays into both nostrils daily.,  Disp: 16 g, Rfl: 0   levocetirizine (XYZAL) 5 MG tablet, Take 1 tablet (5 mg total) by mouth every evening., Disp: 30 tablet, Rfl: 0   albuterol (VENTOLIN HFA) 108 (90 Base) MCG/ACT inhaler, Inhale 2 puffs into the lungs every 4 (four) hours as needed for wheezing or shortness of breath., Disp: 1 each, Rfl: 0   cholecalciferol (VITAMIN D3) 25 MCG (1000 UNIT) tablet, Take 1,000 Units by mouth daily., Disp: , Rfl:    cyclobenzaprine (FLEXERIL) 10 MG tablet, Take 1 tablet (10 mg total) by mouth 3 (three) times daily as needed for muscle spasms., Disp: 10 tablet, Rfl: 0   ferrous sulfate 325 (65 FE) MG EC tablet, Take 325 mg by mouth daily., Disp: , Rfl:    levETIRAcetam (KEPPRA) 1000 MG tablet, Take 1 tablet (1,000 mg total) by mouth 2 (two) times daily., Disp: 60 tablet, Rfl: 11  Observations/Objective: Patient is well-developed, well-nourished in no acute distress.  Resting comfortably at home.  Head is normocephalic, atraumatic.  No labored breathing. Speech is clear and coherent with logical content.  Patient is alert and oriented at baseline.   Assessment and Plan: 1. Chronic rhinitis (Primary) - levocetirizine (XYZAL) 5 MG tablet; Take 1 tablet (5 mg total) by mouth every evening.  Dispense: 30 tablet; Refill: 0 - fluticasone (FLONASE) 50 MCG/ACT nasal spray; Place 2 sprays into both nostrils daily.  Dispense: 16 g; Refill: 0  Supportive measures and OTC medications reviewed. Start Xyzal and Flonase once daily. Follow-up wit PCP for any non-resolving/controlled allergy symptom despite treatment.  Follow Up Instructions: I discussed the assessment and treatment plan with the patient. The patient was provided an opportunity to ask questions and all were answered. The patient agreed with the plan and demonstrated an understanding of the instructions.  A copy of instructions were sent to the patient via MyChart unless otherwise noted below.   The patient was advised to call back or seek an  in-person evaluation if the symptoms worsen or if the condition fails to improve as anticipated.    Hyla Maillard, PA-C

## 2023-09-02 ENCOUNTER — Encounter

## 2023-09-02 ENCOUNTER — Telehealth: Admitting: Family Medicine

## 2023-09-02 DIAGNOSIS — M5442 Lumbago with sciatica, left side: Secondary | ICD-10-CM

## 2023-09-02 MED ORDER — CYCLOBENZAPRINE HCL 10 MG PO TABS: 10.0000 mg | ORAL_TABLET | Freq: Three times a day (TID) | ORAL | 0 refills | Status: DC | PRN

## 2023-09-02 MED ORDER — METHOCARBAMOL 500 MG PO TABS: 500.0000 mg | ORAL_TABLET | Freq: Three times a day (TID) | ORAL | 0 refills | Status: AC | PRN

## 2023-09-02 NOTE — Addendum Note (Signed)
 Addended by: Angelia Kelp on: 09/02/2023 04:51 PM   Modules accepted: Orders

## 2023-09-02 NOTE — Progress Notes (Signed)

## 2023-09-07 ENCOUNTER — Telehealth: Admitting: Family Medicine

## 2023-09-07 DIAGNOSIS — M5442 Lumbago with sciatica, left side: Secondary | ICD-10-CM

## 2023-09-07 NOTE — Progress Notes (Signed)
 [place]   Continued back pain despite recent EV with medication to help. Needs to be assessed in person for on going and worsening symptoms. ED recommended.    Patient acknowledged agreement and understanding of the plan.   Past Medical, Surgical, Social History, Allergies, and Medications have been Reviewed.

## 2023-10-03 ENCOUNTER — Telehealth: Admitting: Nurse Practitioner

## 2023-10-03 DIAGNOSIS — B3731 Acute candidiasis of vulva and vagina: Secondary | ICD-10-CM

## 2023-10-03 MED ORDER — FLUCONAZOLE 150 MG PO TABS: 150.0000 mg | ORAL_TABLET | Freq: Once | ORAL | 0 refills | Status: AC

## 2023-10-03 NOTE — Progress Notes (Signed)

## 2023-10-03 NOTE — Progress Notes (Signed)
 I have spent 5 minutes in review of e-visit questionnaire, review and updating patient chart, medical decision making and response to patient.   [name], NP

## 2023-10-08 ENCOUNTER — Telehealth: Admitting: Nurse Practitioner

## 2023-10-08 DIAGNOSIS — N898 Other specified noninflammatory disorders of vagina: Secondary | ICD-10-CM

## 2023-10-08 NOTE — Progress Notes (Signed)
 L[place],  You marked that you have had a new partner in the past 2 months. We would recommend since symptoms are not responding to the yeast medicine that you get testing performed.   Any of the [facility] locations should take your insurance    I feel your condition warrants further evaluation and I recommend that you be seen in a face-to-face visit.   NOTE: There will be NO CHARGE for this E-Visit   If you are having a true medical emergency, please call 911.     For an urgent face to face visit, [facility] has multiple urgent care centers for your convenience.  Click the link below for the full list of locations and hours, walk-in wait times, appointment scheduling options and driving directions:  Urgent Care - [place], [place], [place], [place], [place], [place]  [place]     Your MyChart E-visit questionnaire answers were reviewed by a board certified advanced clinical practitioner to complete your personal care plan based on your specific symptoms.    Thank you for using e-Visits.

## 2023-10-15 ENCOUNTER — Telehealth: Admitting: Physician Assistant

## 2023-10-15 DIAGNOSIS — R197 Diarrhea, unspecified: Secondary | ICD-10-CM

## 2023-10-15 DIAGNOSIS — N898 Other specified noninflammatory disorders of vagina: Secondary | ICD-10-CM

## 2023-10-15 DIAGNOSIS — R300 Dysuria: Secondary | ICD-10-CM

## 2023-10-15 DIAGNOSIS — R109 Unspecified abdominal pain: Secondary | ICD-10-CM

## 2023-10-15 NOTE — Progress Notes (Signed)
  Because of having urinary symptoms with stomach pain, diarrhea, and vaginal discharge, I feel your condition warrants further evaluation and I recommend that you be seen in a face-to-face visit.   NOTE: There will be NO CHARGE for this E-Visit   If you are having a true medical emergency, please call 911.     For an urgent face to face visit, [facility] has multiple urgent care centers for your convenience.  Click the link below for the full list of locations and hours, walk-in wait times, appointment scheduling options and driving directions:  Urgent Care - [place], [place], [place], [place], [place], [place]  [place]     Your MyChart E-visit questionnaire answers were reviewed by a board certified advanced clinical practitioner to complete your personal care plan based on your specific symptoms.    Thank you for using e-Visits.     I have spent 5 minutes in review of e-visit questionnaire, review and updating patient chart, medical decision making and response to patient.   Angelia Kelp, PA-C

## 2023-11-10 ENCOUNTER — Telehealth: Admitting: Physician Assistant

## 2023-11-10 DIAGNOSIS — S3992XA Unspecified injury of lower back, initial encounter: Secondary | ICD-10-CM

## 2023-11-10 NOTE — Progress Notes (Signed)
  Because of 10/10 back pain after experiencing a fall and need for exam and imaging, I feel your condition warrants further evaluation and I recommend that you be seen in a face-to-face visit.   NOTE: There will be NO CHARGE for this E-Visit   If you are having a true medical emergency, please call 911.     For an urgent face to face visit, [facility] has multiple urgent care centers for your convenience.  Click the link below for the full list of locations and hours, walk-in wait times, appointment scheduling options and driving directions:  Urgent Care - [place], [place], [place], [place], [place], [place]  [place]     Your MyChart E-visit questionnaire answers were reviewed by a board certified advanced clinical practitioner to complete your personal care plan based on your specific symptoms.    Thank you for using e-Visits.

## 2023-11-23 ENCOUNTER — Telehealth: Admitting: Family Medicine

## 2023-11-23 DIAGNOSIS — J069 Acute upper respiratory infection, unspecified: Secondary | ICD-10-CM

## 2023-11-23 MED ORDER — BENZONATATE 100 MG PO CAPS: 100.0000 mg | ORAL_CAPSULE | Freq: Three times a day (TID) | ORAL | 0 refills | Status: AC | PRN

## 2023-11-23 MED ORDER — IPRATROPIUM BROMIDE 0.03 % NA SOLN: 2.0000 | Freq: Two times a day (BID) | NASAL | 0 refills | Status: AC

## 2023-11-23 NOTE — Progress Notes (Signed)

## 2023-11-23 NOTE — Progress Notes (Signed)
 I have spent 5 minutes in review of e-visit questionnaire, review and updating patient chart, medical decision making and response to patient.   [name], PA-C

## 2024-01-26 DEATH — deceased
